# Patient Record
Sex: Male | Born: 1950 | Race: Black or African American | Hispanic: No | Marital: Married | Smoking: Former smoker
Health system: Southern US, Community
[De-identification: ages and names within clinical notes are randomized; demographics above are authoritative.]

## PROBLEM LIST (undated history)

## (undated) DIAGNOSIS — R011 Cardiac murmur, unspecified: Secondary | ICD-10-CM

## (undated) DIAGNOSIS — I519 Heart disease, unspecified: Secondary | ICD-10-CM

## (undated) DIAGNOSIS — N183 Chronic kidney disease, stage 3 unspecified: Secondary | ICD-10-CM

## (undated) DIAGNOSIS — I480 Paroxysmal atrial fibrillation: Secondary | ICD-10-CM

## (undated) DIAGNOSIS — M1990 Unspecified osteoarthritis, unspecified site: Secondary | ICD-10-CM

## (undated) DIAGNOSIS — I2510 Atherosclerotic heart disease of native coronary artery without angina pectoris: Secondary | ICD-10-CM

## (undated) DIAGNOSIS — E669 Obesity, unspecified: Secondary | ICD-10-CM

## (undated) DIAGNOSIS — I219 Acute myocardial infarction, unspecified: Secondary | ICD-10-CM

## (undated) DIAGNOSIS — I499 Cardiac arrhythmia, unspecified: Secondary | ICD-10-CM

## (undated) DIAGNOSIS — I10 Essential (primary) hypertension: Secondary | ICD-10-CM

## (undated) DIAGNOSIS — G709 Myoneural disorder, unspecified: Secondary | ICD-10-CM

## (undated) DIAGNOSIS — E1169 Type 2 diabetes mellitus with other specified complication: Secondary | ICD-10-CM

## (undated) DIAGNOSIS — Z9289 Personal history of other medical treatment: Secondary | ICD-10-CM

## (undated) HISTORY — DX: Obesity, unspecified: E66.9

## (undated) HISTORY — DX: Heart disease, unspecified: I51.9

## (undated) HISTORY — DX: Chronic kidney disease, stage 3 (moderate): N18.3

## (undated) HISTORY — DX: Essential (primary) hypertension: I10

## (undated) HISTORY — PX: RIB FRACTURE SURGERY: SHX2358

## (undated) HISTORY — PX: OTHER SURGICAL HISTORY: SHX169

## (undated) HISTORY — DX: Paroxysmal atrial fibrillation: I48.0

## (undated) HISTORY — DX: Type 2 diabetes mellitus with other specified complication: E66.9

## (undated) HISTORY — DX: Type 2 diabetes mellitus with other specified complication: E11.69

## (undated) HISTORY — DX: Personal history of other medical treatment: Z92.89

## (undated) HISTORY — DX: Chronic kidney disease, stage 3 unspecified: N18.30

---

## 2002-12-17 ENCOUNTER — Emergency Department (HOSPITAL_COMMUNITY): Admission: EM | Admit: 2002-12-17 | Discharge: 2002-12-17 | Payer: Self-pay | Admitting: Emergency Medicine

## 2003-05-03 ENCOUNTER — Encounter: Admission: RE | Admit: 2003-05-03 | Discharge: 2003-05-03 | Payer: Self-pay | Admitting: Family Medicine

## 2003-08-05 ENCOUNTER — Emergency Department (HOSPITAL_COMMUNITY): Admission: EM | Admit: 2003-08-05 | Discharge: 2003-08-05 | Payer: Self-pay | Admitting: Emergency Medicine

## 2007-10-15 ENCOUNTER — Inpatient Hospital Stay (HOSPITAL_COMMUNITY): Admission: EM | Admit: 2007-10-15 | Discharge: 2007-10-25 | Payer: Self-pay | Admitting: Emergency Medicine

## 2010-03-23 ENCOUNTER — Encounter: Payer: Self-pay | Admitting: Emergency Medicine
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
## 2011-03-08 ENCOUNTER — Encounter: Payer: BC Managed Care – PPO | Attending: Endocrinology | Admitting: *Deleted

## 2011-03-08 DIAGNOSIS — Z713 Dietary counseling and surveillance: Secondary | ICD-10-CM | POA: Insufficient documentation

## 2011-03-08 DIAGNOSIS — E119 Type 2 diabetes mellitus without complications: Secondary | ICD-10-CM | POA: Insufficient documentation

## 2011-03-09 ENCOUNTER — Encounter: Payer: Self-pay | Admitting: *Deleted

## 2011-03-09 NOTE — Progress Notes (Signed)
  Patient was seen on 03/08/11 for the first of a series of three diabetes self-management courses at the [facility]. The following learning objectives were met by the patient during this course:   Defines the role of glucose and insulin  Identifies type of diabetes and pathophysiology  Defines the diagnostic criteria for diabetes and prediabetes  States the risk factors for Type 2 Diabetes  States the symptoms of Type 2 Diabetes  Defines Type 2 Diabetes treatment goals  Defines Type 2 Diabetes treatment options  States the rationale for glucose monitoring  Identifies A1C, glucose targets, and testing times  Identifies proper sharps disposal  Defines the purpose of a diabetes food plan  Identifies carbohydrate food groups  Defines effects of carbohydrate foods on glucose levels  Identifies carbohydrate choices/grams/food labels  States benefits of physical activity and effect on glucose  Review of suggested activity guidelines  Handouts given during class include:  Type 2 Diabetes: Basics Book  My Food Plan Book  Food and Activity Log  No results found for this basename: HGBA1C   Most recent A1C per referring MD = 11.4%  Patient has established the following initial goals:  Lose weight  Follow DM Meal Plan  Increase exercise  Follow-Up Plan: Pt to call for follow-up or additional information PRN

## 2011-03-09 NOTE — Patient Instructions (Signed)
Patient will attend Core Diabetes Courses II and III as scheduled or follow up prn.  

## 2012-03-01 DIAGNOSIS — I499 Cardiac arrhythmia, unspecified: Secondary | ICD-10-CM

## 2012-03-01 DIAGNOSIS — I519 Heart disease, unspecified: Secondary | ICD-10-CM

## 2012-03-01 DIAGNOSIS — I219 Acute myocardial infarction, unspecified: Secondary | ICD-10-CM

## 2012-03-01 DIAGNOSIS — I480 Paroxysmal atrial fibrillation: Secondary | ICD-10-CM

## 2012-03-01 HISTORY — DX: Paroxysmal atrial fibrillation: I48.0

## 2012-03-01 HISTORY — DX: Acute myocardial infarction, unspecified: I21.9

## 2012-03-01 HISTORY — DX: Heart disease, unspecified: I51.9

## 2012-03-01 HISTORY — DX: Cardiac arrhythmia, unspecified: I49.9

## 2012-03-20 ENCOUNTER — Inpatient Hospital Stay (HOSPITAL_COMMUNITY)
Admission: AD | Admit: 2012-03-20 | Discharge: 2012-03-28 | DRG: 121 | Disposition: A | Discharge status: Home / Self Care | Payer: BC Managed Care – PPO | Source: Other Acute Inpatient Hospital | Attending: Cardiology | Admitting: Cardiology

## 2012-03-20 DIAGNOSIS — I200 Unstable angina: Secondary | ICD-10-CM

## 2012-03-20 DIAGNOSIS — I2589 Other forms of chronic ischemic heart disease: Secondary | ICD-10-CM | POA: Diagnosis present

## 2012-03-20 DIAGNOSIS — I441 Atrioventricular block, second degree: Secondary | ICD-10-CM | POA: Diagnosis present

## 2012-03-20 DIAGNOSIS — I255 Ischemic cardiomyopathy: Secondary | ICD-10-CM

## 2012-03-20 DIAGNOSIS — R55 Syncope and collapse: Secondary | ICD-10-CM

## 2012-03-20 DIAGNOSIS — I214 Non-ST elevation (NSTEMI) myocardial infarction: Secondary | ICD-10-CM | POA: Diagnosis present

## 2012-03-20 DIAGNOSIS — N189 Chronic kidney disease, unspecified: Secondary | ICD-10-CM

## 2012-03-20 DIAGNOSIS — N183 Chronic kidney disease, stage 3 unspecified: Secondary | ICD-10-CM | POA: Diagnosis present

## 2012-03-20 DIAGNOSIS — E669 Obesity, unspecified: Secondary | ICD-10-CM | POA: Diagnosis present

## 2012-03-20 DIAGNOSIS — Z6837 Body mass index (BMI) 37.0-37.9, adult: Secondary | ICD-10-CM

## 2012-03-20 DIAGNOSIS — E785 Hyperlipidemia, unspecified: Secondary | ICD-10-CM

## 2012-03-20 DIAGNOSIS — E119 Type 2 diabetes mellitus without complications: Secondary | ICD-10-CM | POA: Diagnosis present

## 2012-03-20 DIAGNOSIS — I4891 Unspecified atrial fibrillation: Secondary | ICD-10-CM | POA: Diagnosis present

## 2012-03-20 DIAGNOSIS — I129 Hypertensive chronic kidney disease with stage 1 through stage 4 chronic kidney disease, or unspecified chronic kidney disease: Secondary | ICD-10-CM | POA: Diagnosis present

## 2012-03-20 DIAGNOSIS — I2510 Atherosclerotic heart disease of native coronary artery without angina pectoris: Secondary | ICD-10-CM

## 2012-03-20 DIAGNOSIS — I480 Paroxysmal atrial fibrillation: Secondary | ICD-10-CM

## 2012-03-20 DIAGNOSIS — I499 Cardiac arrhythmia, unspecified: Secondary | ICD-10-CM

## 2012-03-20 DIAGNOSIS — I498 Other specified cardiac arrhythmias: Secondary | ICD-10-CM | POA: Diagnosis present

## 2012-03-20 DIAGNOSIS — Z79899 Other long term (current) drug therapy: Secondary | ICD-10-CM

## 2012-03-20 DIAGNOSIS — D649 Anemia, unspecified: Secondary | ICD-10-CM | POA: Diagnosis present

## 2012-03-20 DIAGNOSIS — I10 Essential (primary) hypertension: Secondary | ICD-10-CM | POA: Diagnosis present

## 2012-03-20 DIAGNOSIS — R001 Bradycardia, unspecified: Secondary | ICD-10-CM

## 2012-03-20 HISTORY — DX: Unspecified osteoarthritis, unspecified site: M19.90

## 2012-03-20 HISTORY — DX: Acute myocardial infarction, unspecified: I21.9

## 2012-03-20 HISTORY — DX: Atherosclerotic heart disease of native coronary artery without angina pectoris: I25.10

## 2012-03-20 HISTORY — DX: Myoneural disorder, unspecified: G70.9

## 2012-03-20 HISTORY — DX: Cardiac murmur, unspecified: R01.1

## 2012-03-20 HISTORY — DX: Cardiac arrhythmia, unspecified: I49.9

## 2012-03-20 LAB — CBC
HCT: 31.6 % — ABNORMAL LOW (ref 39.0–52.0)
Hemoglobin: 10.8 g/dL — ABNORMAL LOW (ref 13.0–17.0)
RBC: 3.58 MIL/uL — ABNORMAL LOW (ref 4.22–5.81)
WBC: 6.5 10*3/uL (ref 4.0–10.5)

## 2012-03-20 MED ORDER — ACETAMINOPHEN 325 MG PO TABS
650.0000 mg | ORAL_TABLET | ORAL | Status: DC | PRN
  Filled 2012-03-20: qty 2

## 2012-03-20 MED ORDER — INSULIN ASPART 100 UNIT/ML ~~LOC~~ SOLN
0.0000 [IU] | Freq: Every day | SUBCUTANEOUS | Status: DC
  Administered 2012-03-23 – 2012-03-24 (×2): 2 [IU] via SUBCUTANEOUS

## 2012-03-20 MED ORDER — INSULIN ASPART 100 UNIT/ML ~~LOC~~ SOLN: 0.0000 [IU] | Freq: Three times a day (TID) | SUBCUTANEOUS | Status: DC

## 2012-03-20 MED ORDER — ONDANSETRON HCL 4 MG/2ML IJ SOLN: 4.0000 mg | Freq: Four times a day (QID) | INTRAMUSCULAR | Status: DC | PRN

## 2012-03-20 MED ORDER — HEPARIN SODIUM (PORCINE) 5000 UNIT/ML IJ SOLN
5000.0000 [IU] | Freq: Three times a day (TID) | INTRAMUSCULAR | Status: DC
  Administered 2012-03-21: 5000 [IU] via SUBCUTANEOUS
  Filled 2012-03-20 (×2): qty 1

## 2012-03-20 NOTE — H&P (Signed)
Cardiology History and Physical  Default, Provider, MD  History of Present Illness (and review of medical records): Obed Samek is a 62 y.o. male who was transferred from [facility] for further evaluation of possible syncopal episode.  He has hx of HTN, HLD, DM, with prior hx of MI or known CAD.  He had prior stress testing 10-15 yrs ago but no catheterizations.  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  Of note patient does not remember syncope, but this is stated in ED reports from coworkers.  He reports episode where he felt disoriented, nauseous, with mild chest discomfort 1/10.  This chest discomfort felt like gas pain and is still present now.  His first troponin at ED was 0.068.  He did have eleated SBP in 200s and was given clonidine.  Blood sugar was in 200s.  He was transferred for further evaluation.  Review of Systems Further review of systems was otherwise negative other than stated in HPI.  There are no active problems to display for this patient.  Past Medical History  Diagnosis Date  . Diabetes mellitus   . Obese   . Hypertension    Medications Prior to Admission  Medication Sig Dispense Refill  . glipiZIDE (GLUCOTROL) 10 MG tablet Take 10 mg by mouth 2 (two) times daily before a meal.      . hydrochlorothiazide (HYDRODIURIL) 25 MG tablet Take 25 mg by mouth daily.      Marland Kitchen lisinopril (PRINIVIL,ZESTRIL) 40 MG tablet Take 40 mg by mouth daily.      . pioglitazone (ACTOS) 30 MG tablet Take 30 mg by mouth daily.      . pravastatin (PRAVACHOL) 40 MG tablet Take 40 mg by mouth daily.      . sitaGLIPtin (JANUVIA) 50 MG tablet Take 50 mg by mouth daily.        No past surgical history on file.  No Known Allergies  History  Substance Use Topics  . Smoking status: Never Smoker   . Smokeless tobacco: Not on file  . Alcohol Use: No    No family history on file.   Objective: Patient Vitals for the past 8  hrs:  BP Temp Pulse Resp SpO2 Height Weight  03/20/12 2317 - - - - 100 % - -  03/20/12 2045 126/77 mmHg 97.8 F (36.6 C) 56  18  100 % 6\' 2"  (1.88 m) 133.63 kg (294 lb 9.6 oz)   General Appearance:    Alert, cooperative, no distress, appears stated age, obese male  Head:    Normocephalic, without obvious abnormality, atraumatic  Eyes:     PERRL, EOMI, anicteric sclerae  Neck:   Supple, no carotid bruit or JVD  Lungs:     Clear to auscultation bilaterally, respirations unlabored  Heart:    bradycardic, S1 and S2 normal, no murmur  Abdomen:     Soft, non-tender, normoactive bowel sounds  Extremities:   Extremities normal, atraumatic, no cyanosis or edema  Pulses:   2+ and symmetric all extremities  Skin:   no rashes or lesions  Neurologic:   No focal deficits. AAO x3   Results for orders placed during the hospital encounter of 03/20/12 (from the past 48 hour(s))  GLUCOSE, CAPILLARY     Status: Abnormal   Collection Time   03/20/12  8:53 PM      Component Value Range Comment   Glucose-Capillary 171 (*) 70 - 99  mg/dL   CBC     Status: Abnormal   Collection Time   03/20/12 11:34 PM      Component Value Range Comment   WBC 6.5  4.0 - 10.5 K/uL    RBC 3.58 (*) 4.22 - 5.81 MIL/uL    Hemoglobin 10.8 (*) 13.0 - 17.0 g/dL    HCT 16.1 (*) 09.6 - 52.0 %    MCV 88.3  78.0 - 100.0 fL    MCH 30.2  26.0 - 34.0 pg    MCHC 34.2  30.0 - 36.0 g/dL    RDW 04.5  40.9 - 81.1 %    Platelets 193  150 - 400 K/uL   CREATININE, SERUM     Status: Abnormal   Collection Time   03/20/12 11:34 PM      Component Value Range Comment   Creatinine, Ser 2.03 (*) 0.50 - 1.35 mg/dL    GFR calc non Af Amer 33 (*) >90 mL/min    GFR calc Af Amer 39 (*) >90 mL/min   TROPONIN I     Status: Abnormal   Collection Time   03/20/12 11:34 PM      Component Value Range Comment   Troponin I 4.04 (*) <0.30 ng/mL    No results found.  ECG:  Sinus bradycardia HR45, FAVB, possible LVH, cannot rule out inferior ischemia, none  prior to compare  Assessment: 62M hx of DM, HTN, HLD p/w chest discomfort, nausea, and possible presyncope/syncope with positive troponin concerning for ACS/NSTEMI.  Plan:  1. Admit to Cardiology. 2. Continuous monitoring on Telemetry. 3. Repeat ekg on admit, prn chest pain or arrythmia 4. Trend cardiac biomarkers, check lipids, hgba1c, tsh 5. Medical management to include ASA, Heparin gtt, Statin, NTG prn 6. Hold BB given bradycardia. 7. Gentle IVFs with likely Acute on CRI 8. Will keep NPO for ischemic evaluation.

## 2012-03-21 ENCOUNTER — Encounter (HOSPITAL_COMMUNITY)
Admission: AD | Disposition: A | Discharge status: Home / Self Care | Payer: Self-pay | Source: Other Acute Inpatient Hospital | Attending: Cardiology

## 2012-03-21 DIAGNOSIS — I059 Rheumatic mitral valve disease, unspecified: Secondary | ICD-10-CM

## 2012-03-21 DIAGNOSIS — E119 Type 2 diabetes mellitus without complications: Secondary | ICD-10-CM

## 2012-03-21 DIAGNOSIS — I2510 Atherosclerotic heart disease of native coronary artery without angina pectoris: Secondary | ICD-10-CM

## 2012-03-21 DIAGNOSIS — N189 Chronic kidney disease, unspecified: Secondary | ICD-10-CM

## 2012-03-21 HISTORY — PX: LEFT HEART CATHETERIZATION WITH CORONARY ANGIOGRAM: SHX5451

## 2012-03-21 HISTORY — PX: CARDIAC CATHETERIZATION: SHX172

## 2012-03-21 LAB — CBC
Hemoglobin: 10.6 g/dL — ABNORMAL LOW (ref 13.0–17.0)
MCH: 29.9 pg (ref 26.0–34.0)
RBC: 3.54 MIL/uL — ABNORMAL LOW (ref 4.22–5.81)
WBC: 5.3 10*3/uL (ref 4.0–10.5)

## 2012-03-21 LAB — BASIC METABOLIC PANEL
CO2: 24 mEq/L (ref 19–32)
Calcium: 8.9 mg/dL (ref 8.4–10.5)
Chloride: 98 mEq/L (ref 96–112)
Glucose, Bld: 200 mg/dL — ABNORMAL HIGH (ref 70–99)
Potassium: 4.1 mEq/L (ref 3.5–5.1)
Sodium: 132 mEq/L — ABNORMAL LOW (ref 135–145)

## 2012-03-21 LAB — GLUCOSE, CAPILLARY: Glucose-Capillary: 217 mg/dL — ABNORMAL HIGH (ref 70–99)

## 2012-03-21 LAB — CREATININE, SERUM
Creatinine, Ser: 2.03 mg/dL — ABNORMAL HIGH (ref 0.50–1.35)
GFR calc Af Amer: 39 mL/min — ABNORMAL LOW (ref >90)
GFR calc non Af Amer: 33 mL/min — ABNORMAL LOW (ref >90)

## 2012-03-21 LAB — TROPONIN I: Troponin I: 5.79 ng/mL (ref <0.30)

## 2012-03-21 LAB — PROTIME-INR: INR: 1.13 (ref 0.00–1.49)

## 2012-03-21 LAB — LIPID PANEL: Cholesterol: 151 mg/dL (ref 0–200)

## 2012-03-21 SURGERY — LEFT HEART CATHETERIZATION WITH CORONARY ANGIOGRAM
Anesthesia: LOCAL

## 2012-03-21 MED ORDER — SODIUM CHLORIDE 0.9 % IV SOLN: 250.0000 mL | INTRAVENOUS | Status: DC | PRN

## 2012-03-21 MED ORDER — MIDAZOLAM HCL 2 MG/2ML IJ SOLN
INTRAMUSCULAR | Status: AC
  Filled 2012-03-21: qty 2

## 2012-03-21 MED ORDER — ASPIRIN 81 MG PO CHEW: 324.0000 mg | CHEWABLE_TABLET | ORAL | Status: DC

## 2012-03-21 MED ORDER — SODIUM CHLORIDE 0.9 % IV SOLN
1.0000 mL/kg/h | INTRAVENOUS | Status: AC
  Administered 2012-03-21: 18:00:00 1 mL/kg/h via INTRAVENOUS

## 2012-03-21 MED ORDER — SODIUM CHLORIDE 0.9 % IV SOLN: INTRAVENOUS | Status: AC

## 2012-03-21 MED ORDER — ISOSORBIDE MONONITRATE ER 30 MG PO TB24
30.0000 mg | ORAL_TABLET | Freq: Every day | ORAL | Status: DC
  Administered 2012-03-21 – 2012-03-22 (×2): 30 mg via ORAL
  Filled 2012-03-21 (×3): qty 1

## 2012-03-21 MED ORDER — LIDOCAINE HCL (PF) 1 % IJ SOLN
INTRAMUSCULAR | Status: AC
  Filled 2012-03-21: qty 30

## 2012-03-21 MED ORDER — FENTANYL CITRATE 0.05 MG/ML IJ SOLN
INTRAMUSCULAR | Status: AC
  Filled 2012-03-21: qty 2

## 2012-03-21 MED ORDER — INSULIN ASPART 100 UNIT/ML ~~LOC~~ SOLN: 0.0000 [IU] | Freq: Every day | SUBCUTANEOUS | Status: DC

## 2012-03-21 MED ORDER — HEPARIN BOLUS VIA INFUSION
4000.0000 [IU] | Freq: Once | INTRAVENOUS | Status: AC
  Administered 2012-03-21: 4000 [IU] via INTRAVENOUS
  Filled 2012-03-21: qty 4000

## 2012-03-21 MED ORDER — SODIUM CHLORIDE 0.9 % IJ SOLN: 3.0000 mL | Freq: Two times a day (BID) | INTRAMUSCULAR | Status: DC

## 2012-03-21 MED ORDER — HEPARIN SODIUM (PORCINE) 1000 UNIT/ML IJ SOLN
INTRAMUSCULAR | Status: AC
  Filled 2012-03-21: qty 1

## 2012-03-21 MED ORDER — VERAPAMIL HCL 2.5 MG/ML IV SOLN
INTRAVENOUS | Status: AC
  Filled 2012-03-21: qty 2

## 2012-03-21 MED ORDER — ATORVASTATIN CALCIUM 80 MG PO TABS
80.0000 mg | ORAL_TABLET | Freq: Every day | ORAL | Status: DC
  Administered 2012-03-22 – 2012-03-28 (×7): 80 mg via ORAL
  Filled 2012-03-21 (×9): qty 1

## 2012-03-21 MED ORDER — SODIUM CHLORIDE 0.9 % IV SOLN
INTRAVENOUS | Status: DC
  Administered 2012-03-21: 12:00:00 via INTRAVENOUS

## 2012-03-21 MED ORDER — INSULIN ASPART 100 UNIT/ML ~~LOC~~ SOLN: 0.0000 [IU] | SUBCUTANEOUS | Status: DC

## 2012-03-21 MED ORDER — INSULIN ASPART 100 UNIT/ML ~~LOC~~ SOLN
0.0000 [IU] | Freq: Three times a day (TID) | SUBCUTANEOUS | Status: DC
  Administered 2012-03-21: 3 [IU] via SUBCUTANEOUS
  Administered 2012-03-21: 5 [IU] via SUBCUTANEOUS
  Administered 2012-03-22 (×2): 3 [IU] via SUBCUTANEOUS
  Administered 2012-03-22: 13:00:00 5 [IU] via SUBCUTANEOUS
  Administered 2012-03-23: 2 [IU] via SUBCUTANEOUS
  Administered 2012-03-23: 3 [IU] via SUBCUTANEOUS
  Administered 2012-03-23: 5 [IU] via SUBCUTANEOUS
  Administered 2012-03-24 (×2): 3 [IU] via SUBCUTANEOUS
  Administered 2012-03-24 – 2012-03-25 (×2): 5 [IU] via SUBCUTANEOUS
  Administered 2012-03-25 – 2012-03-26 (×2): 3 [IU] via SUBCUTANEOUS
  Administered 2012-03-26: 5 [IU] via SUBCUTANEOUS
  Administered 2012-03-27 – 2012-03-28 (×4): 3 [IU] via SUBCUTANEOUS

## 2012-03-21 MED ORDER — NITROGLYCERIN 0.2 MG/ML ON CALL CATH LAB
INTRAVENOUS | Status: AC
  Filled 2012-03-21: qty 1

## 2012-03-21 MED ORDER — METOPROLOL TARTRATE 25 MG PO TABS
25.0000 mg | ORAL_TABLET | Freq: Two times a day (BID) | ORAL | Status: DC
  Administered 2012-03-21: 25 mg via ORAL
  Filled 2012-03-21 (×3): qty 1

## 2012-03-21 MED ORDER — SODIUM CHLORIDE 0.9 % IJ SOLN: 3.0000 mL | INTRAMUSCULAR | Status: DC | PRN

## 2012-03-21 MED ORDER — ASPIRIN 81 MG PO CHEW
324.0000 mg | CHEWABLE_TABLET | Freq: Every day | ORAL | Status: DC
  Administered 2012-03-21 – 2012-03-28 (×8): 324 mg via ORAL
  Filled 2012-03-21: qty 4
  Filled 2012-03-21: qty 1
  Filled 2012-03-21 (×5): qty 4
  Filled 2012-03-21: qty 1

## 2012-03-21 MED ORDER — HEPARIN (PORCINE) IN NACL 100-0.45 UNIT/ML-% IJ SOLN
1400.0000 [IU]/h | INTRAMUSCULAR | Status: DC
  Administered 2012-03-21: 1400 [IU]/h via INTRAVENOUS
  Filled 2012-03-21 (×2): qty 250

## 2012-03-21 MED ORDER — HEPARIN (PORCINE) IN NACL 2-0.9 UNIT/ML-% IJ SOLN
INTRAMUSCULAR | Status: AC
  Filled 2012-03-21: qty 2000

## 2012-03-21 MED ORDER — SODIUM CHLORIDE 0.9 % IV SOLN: INTRAVENOUS | Status: DC

## 2012-03-21 NOTE — Progress Notes (Signed)
Patient ID: [name] [name], male   DOB: 10/30/1950, 61 y.o.   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  No definite chest pain.  Nausea lasted at least an hour.  He did not actually pass out, just had to sit down while working because of the nausea. Today, he feels fine with no nausea or chest pain.   ECG this morning NSR with inferior T wave inversions suggestive of ischemia.    Filed Vitals:   03/20/12 2045 03/20/12 2317 03/21/12 0500  BP: 126/77  124/63  Pulse: 56  53  Temp: 97.8 F (36.6 C)  98.6 F (37 C)  Resp: 18  18  Height: 6' 2" (1.88 m)    Weight: 294 lb 9.6 oz (133.63 kg)    SpO2: 100% 100% 100%   No intake or output data in the 24 hours ending 03/21/12 0826  LABS: Basic Metabolic Panel:  Basename 03/21/12 0505 03/20/12 2334  NA 132* --  K 4.1 --  CL 98 --  CO2 24 --  GLUCOSE 200* --  BUN 33* --  CREATININE 2.11* 2.03*  CALCIUM 8.9 --  MG -- --  PHOS -- --   Liver Function Tests: No results found for this basename: AST:2,ALT:2,ALKPHOS:2,BILITOT:2,PROT:2,ALBUMIN:2 in the last 72 hours No results found for this basename: LIPASE:2,AMYLASE:2 in the last 72 hours CBC:  Basename 03/21/12 0505 03/20/12 2334  WBC 5.3 6.5  NEUTROABS -- --  HGB 10.6* 10.8*  HCT 31.4* 31.6*  MCV 88.7 88.3  PLT 193 193   Cardiac Enzymes:  Basename 03/21/12 0505 03/20/12 2334  CKTOTAL -- --  CKMB -- --  CKMBINDEX -- --  TROPONINI 5.79* 4.04*   BNP: No components found with this basename: POCBNP:3 D-Dimer: No results found for this basename: DDIMER:2 in the last 72 hours Hemoglobin A1C: No results found for this basename: HGBA1C in the last 72 hours Fasting Lipid Panel:  Basename 03/21/12 0505  CHOL 151  HDL 36*  LDLCALC 105*  TRIG 51  CHOLHDL 4.2  LDLDIRECT --   Thyroid Function Tests: No results found for this basename: TSH,T4TOTAL,FREET3,T3FREE,THYROIDAB in the last 72 hours Anemia Panel: No  results found for this basename: VITAMINB12,FOLATE,FERRITIN,TIBC,IRON,RETICCTPCT in the last 72 hours  RADIOLOGY: No results found.  PHYSICAL EXAM General: NAD, obese Neck: No JVD, no thyromegaly or thyroid nodule.  Lungs: Clear to auscultation bilaterally with normal respiratory effort. CV: Nondisplaced PMI.  Heart regular S1/S2, no S3/S4, no murmur.  No peripheral edema.  No carotid bruit.  Normal pedal pulses.  Abdomen: Soft, nontender, no hepatosplenomegaly, no distention.  Neurologic: Alert and oriented x 3.  Psych: Normal affect. Extremities: No clubbing or cyanosis.   TELEMETRY: Reviewed telemetry pt in NSR  ASSESSMENT AND PLAN:  61 yo with history of CKD, DM, HTN presented with episode of nausea lasting around an hour.  Cardiac enzymes elevated suggestive of NSTEMI. 1. CAD: NSTEMI.  Nausea was likely his ischemic equivalent.  He was admitted with "syncope" but he says he never actually passed out, just had to sit down at work due to nausea.  He is asymptomatic this morning.  ECG with deep inferior T wave inversions.  Patient has CKD with creatinine 2.1 today.  - Hydrate aggressively this morning, catheterization this afternoon.  I had a long discuss with patient and wife about risks of cardiac cath, especially worsening renal function.  However, he has had substantial NSTEMI with TnI 5.79 this morning and ECG   changes. - Continue heparin gtt, ASA, statin.  Holding off on beta blocker for now with HR in the 50s and ACEI with CKD.  - No LV-gram, echo for LV fxn.  2. CKD: Creatinine was 1.9 in 2009, now 2.1.  Suspect this is chronic due to DM and HTN.  Will need to minimize contrast dye with cath and will hydrate aggessively with NS @ 125 cc/hr until this afternoon.  3. DM: Sliding scale insulin for now.  He is on Actos at home, would not use this agent going forward.   [name] [name] 03/21/2012 8:33 AM   

## 2012-03-21 NOTE — Progress Notes (Signed)
ANTICOAGULATION CONSULT NOTE - Initial Consult  Pharmacy Consult for heparin Indication: chest pain/ACS  No Known Allergies  Patient Measurements: Height: 6\' 2"  (188 cm) Weight: 294 lb 9.6 oz (133.63 kg) IBW/kg (Calculated) : 82.2  Heparin Dosing Weight: 100 kg  Vital Signs: Temp: 97.8 F (36.6 C) (01/20 2045) BP: 126/77 mmHg (01/20 2045) Pulse Rate: 56  (01/20 2045)  Labs:  Basename 03/20/12 2334  HGB 10.8*  HCT 31.6*  PLT 193  APTT --  LABPROT --  INR --  HEPARINUNFRC --  CREATININE 2.03*  CKTOTAL --  CKMB --  TROPONINI 4.04*    Estimated Creatinine Clearance: 54.9 ml/min (by C-G formula based on Cr of 2.03).   Medical History: Past Medical History  Diagnosis Date  . Diabetes mellitus   . Obese   . Hypertension     Medications:  No prescriptions prior to admission    Assessment: 62 yo with positive troponin to start IV heparin. Goal of Therapy:  Heparin level 0.3-0.7 units/ml Monitor platelets by anticoagulation protocol: Yes   Plan:  Heparin 4000 unit bolus and drip at 1400 units/hr. Check heparin level and CBC 6 hours after start and daily while on heparin.  [name] [name] 03/21/2012,1:10 AM

## 2012-03-21 NOTE — H&P (View-Only) (Signed)
Patient ID: [name] [name], male   DOB: 1950-12-17, 62 y.o.   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  No definite chest pain.  Nausea lasted at least an hour.  He did not actually pass out, just had to sit down while working because of the nausea. Today, he feels fine with no nausea or chest pain.   ECG this morning NSR with inferior T wave inversions suggestive of ischemia.    Filed Vitals:   03/20/12 2045 03/20/12 2317 03/21/12 0500  BP: 126/77  124/63  Pulse: 56  53  Temp: 97.8 F (36.6 C)  98.6 F (37 C)  Resp: 18  18  Height: 6\' 2"  (1.88 m)    Weight: 294 lb 9.6 oz (133.63 kg)    SpO2: 100% 100% 100%   No intake or output data in the 24 hours ending 03/21/12 0826  LABS: Basic Metabolic Panel:  Basename 03/21/12 0505 03/20/12 2334  NA 132* --  K 4.1 --  CL 98 --  CO2 24 --  GLUCOSE 200* --  BUN 33* --  CREATININE 2.11* 2.03*  CALCIUM 8.9 --  MG -- --  PHOS -- --   Liver Function Tests: No results found for this basename: AST:2,ALT:2,ALKPHOS:2,BILITOT:2,PROT:2,ALBUMIN:2 in the last 72 hours No results found for this basename: LIPASE:2,AMYLASE:2 in the last 72 hours CBC:  Basename 03/21/12 0505 03/20/12 2334  WBC 5.3 6.5  NEUTROABS -- --  HGB 10.6* 10.8*  HCT 31.4* 31.6*  MCV 88.7 88.3  PLT 193 193   Cardiac Enzymes:  Basename 03/21/12 0505 03/20/12 2334  CKTOTAL -- --  CKMB -- --  CKMBINDEX -- --  TROPONINI 5.79* 4.04*   BNP: No components found with this basename: POCBNP:3 D-Dimer: No results found for this basename: DDIMER:2 in the last 72 hours Hemoglobin A1C: No results found for this basename: HGBA1C in the last 72 hours Fasting Lipid Panel:  Basename 03/21/12 0505  CHOL 151  HDL 36*  LDLCALC 105*  TRIG 51  CHOLHDL 4.2  LDLDIRECT --   Thyroid Function Tests: No results found for this basename: TSH,T4TOTAL,FREET3,T3FREE,THYROIDAB in the last 72 hours Anemia Panel: No  results found for this basename: VITAMINB12,FOLATE,FERRITIN,TIBC,IRON,RETICCTPCT in the last 72 hours  RADIOLOGY: No results found.  PHYSICAL EXAM General: NAD, obese Neck: No JVD, no thyromegaly or thyroid nodule.  Lungs: Clear to auscultation bilaterally with normal respiratory effort. CV: Nondisplaced PMI.  Heart regular S1/S2, no S3/S4, no murmur.  No peripheral edema.  No carotid bruit.  Normal pedal pulses.  Abdomen: Soft, nontender, no hepatosplenomegaly, no distention.  Neurologic: Alert and oriented x 3.  Psych: Normal affect. Extremities: No clubbing or cyanosis.   TELEMETRY: Reviewed telemetry pt in NSR  ASSESSMENT AND PLAN:  62 yo with history of CKD, DM, HTN presented with episode of nausea lasting around an hour.  Cardiac enzymes elevated suggestive of NSTEMI. 1. CAD: NSTEMI.  Nausea was likely his ischemic equivalent.  He was admitted with "syncope" but he says he never actually passed out, just had to sit down at work due to nausea.  He is asymptomatic this morning.  ECG with deep inferior T wave inversions.  Patient has CKD with creatinine 2.1 today.  - Hydrate aggressively this morning, catheterization this afternoon.  I had a long discuss with patient and wife about risks of cardiac cath, especially worsening renal function.  However, he has had substantial NSTEMI with TnI 5.79 this morning and ECG  changes. - Continue heparin gtt, ASA, statin.  Holding off on beta blocker for now with HR in the 50s and ACEI with CKD.  - No LV-gram, echo for LV fxn.  2. CKD: Creatinine was 1.9 in 2009, now 2.1.  Suspect this is chronic due to DM and HTN.  Will need to minimize contrast dye with cath and will hydrate aggessively with NS @ 125 cc/hr until this afternoon.  3. DM: Sliding scale insulin for now.  He is on Actos at home, would not use this agent going forward.   Marca Ancona 03/21/2012 8:33 AM

## 2012-03-21 NOTE — Progress Notes (Signed)
CRITICAL VALUE ALERT  Critical value received:  Troponin 4.04   Date of notification:  03/21/2012  Time of notification:  0036  Critical value read back:yes  Nurse who received alert:  Jodene Nam RN  MD notified (1st page):  Dr. Terressa Koyanagi  Time of first page:  0037  Responding MD:  Dr. Terressa Koyanagi  Time MD responded:  [phone]

## 2012-03-21 NOTE — Progress Notes (Signed)
TR BAND REMOVAL  LOCATION:  right radial  DEFLATED PER PROTOCOL:  yes  TIME BAND OFF / DRESSING APPLIED:   1840   SITE UPON ARRIVAL:   Level 0  SITE AFTER BAND REMOVAL:  Level 0  REVERSE ALLEN'S TEST:    positive  CIRCULATION SENSATION AND MOVEMENT:  Within Normal Limits  yes  COMMENTS:

## 2012-03-21 NOTE — CV Procedure (Signed)
   Cardiac Catheterization Procedure Note  Name: [name] [name] MRN: [id number] DOB: Oct 12, 1950  Procedure: Left Heart Cath, Selective Coronary Angiography  Indication: 63 yo BM presents with syncope and a NSTEMI.   Procedural Details: The right wrist was prepped, draped, and anesthetized with 1% lidocaine. Using the modified Seldinger technique, a 5 French sheath was introduced into the right radial artery. 3 mg of verapamil was administered through the sheath, weight-based unfractionated heparin was administered intravenously. Standard Judkins catheters were used for selective coronary angiography.  The left coronary was difficult to engage and a ERADL catheter was used.Catheter exchanges were performed over an exchange length guidewire. There were no immediate procedural complications. A TR band was used for radial hemostasis at the completion of the procedure.  The patient was transferred to the post catheterization recovery area for further monitoring.  Procedural Findings: Hemodynamics: AO 150/68 with a mean of 96 mm Hg LV 151/21 mm Hg  Coronary angiography: Coronary dominance: right  Left mainstem: 30% distal left main.  Left anterior descending (LAD): Mild wall irregularities. There is a large diagonal with a 90-95% ostial lesion.  Left circumflex (LCx): The left circumflex gives rise to a single large marginal branch. There is a 70-80% ostial LCX lesion.  Right coronary artery (RCA): The RCA is occluded proximally with evidence of recent thrombus. There are left to right collaterals to the distal RCA.  Left ventriculography: Not performed.  Final Conclusions:   1. 3 vessel obstructive CAD. Recent RCA occlusion.   Recommendations: Would maximize medical therapy. Consider stress myoview on medical therapy to assess ischemic burden and symptoms. Ostial location of diagonal and LCX disease is not optimal for PCI. If significant symptoms or ischemia on medical therapy may need to  consider CABG.  [name] [facility] 03/21/2012, 4:49 PM

## 2012-03-21 NOTE — Progress Notes (Signed)
1932 Patient heart rate dropped 43 and  rhythm changed to second degree heart block type 2 returning to sinus rhythm.Asymptomatic denies chest pain and shortness of breath blood pressure 146/64.Call placed to Ward Givens NP.No new orders received will continue to monitor patient and strip posted on chart.

## 2012-03-21 NOTE — Progress Notes (Signed)
ANTICOAGULATION CONSULT NOTE - Follw-up  Pharmacy Consult for heparin Indication: chest pain/ACS  No Known Allergies  Patient Measurements: Height: 6\' 2"  (188 cm) Weight: 294 lb 9.6 oz (133.63 kg) IBW/kg (Calculated) : 82.2  Heparin Dosing Weight: 100 kg  Vital Signs: Temp: 98.6 F (37 C) (01/21 0500) BP: 124/63 mmHg (01/21 0500) Pulse Rate: 53  (01/21 0500)  Labs:  Basename 03/21/12 0925 03/21/12 0505 03/20/12 2334  HGB -- 10.6* 10.8*  HCT -- 31.4* 31.6*  PLT -- 193 193  APTT -- -- --  LABPROT -- 14.3 --  INR -- 1.13 --  HEPARINUNFRC 0.59 -- --  CREATININE -- 2.11* 2.03*  CKTOTAL -- -- --  CKMB -- -- --  TROPONINI -- 5.79* 4.04*    Estimated Creatinine Clearance: 52.8 ml/min (by C-G formula based on Cr of 2.11).  Assessment: 45 yom presented with syncope and elevated troponins. First heparin level is therapeutic at 0.59. No bleeding noted. CBC is low but stable. Plan for cath this afternoon after hydration.   Goal of Therapy:  Heparin level 0.3-0.7 units/ml Monitor platelets by anticoagulation protocol: Yes   Plan:  1. Continue heparin gtt at 1400 units/hr 2. Check a 6 hour heparin level or f/u post-cath  Javanni Maring, Drake Leach 03/21/2012,10:53 AM

## 2012-03-21 NOTE — Progress Notes (Signed)
  Echocardiogram 2D Echocardiogram has been performed.  Tobby Fawcett 03/21/2012, 10:30 AM

## 2012-03-21 NOTE — Interval H&P Note (Signed)
History and Physical Interval Note:  03/21/2012 4:08 PM  [name] [name]  has presented today for surgery, with the diagnosis of cp  The various methods of treatment have been discussed with the patient and family. After consideration of risks, benefits and other options for treatment, the patient has consented to  Procedure(s) (LRB) with comments: LEFT HEART CATHETERIZATION WITH CORONARY ANGIOGRAM (N/A) as a surgical intervention .  The patient's history has been reviewed, patient examined, no change in status, stable for surgery.  I have reviewed the patient's chart and labs.  Questions were answered to the patient's satisfaction.     [name] [facility] 03/21/2012 4:09 PM

## 2012-03-22 ENCOUNTER — Encounter (HOSPITAL_COMMUNITY): Payer: Self-pay | Admitting: Cardiology

## 2012-03-22 DIAGNOSIS — I2119 ST elevation (STEMI) myocardial infarction involving other coronary artery of inferior wall: Secondary | ICD-10-CM

## 2012-03-22 DIAGNOSIS — I498 Other specified cardiac arrhythmias: Secondary | ICD-10-CM

## 2012-03-22 LAB — BASIC METABOLIC PANEL
Calcium: 8.9 mg/dL (ref 8.4–10.5)
GFR calc Af Amer: 42 mL/min — ABNORMAL LOW (ref >90)
GFR calc non Af Amer: 36 mL/min — ABNORMAL LOW (ref >90)
Sodium: 135 mEq/L (ref 135–145)

## 2012-03-22 LAB — CBC
MCH: 30.2 pg (ref 26.0–34.0)
MCHC: 33.8 g/dL (ref 30.0–36.0)
Platelets: 186 10*3/uL (ref 150–400)
RBC: 3.38 MIL/uL — ABNORMAL LOW (ref 4.22–5.81)

## 2012-03-22 LAB — GLUCOSE, CAPILLARY

## 2012-03-22 MED ORDER — YOU HAVE A PACEMAKER BOOK
Freq: Once | Status: DC
  Filled 2012-03-22: qty 1

## 2012-03-22 MED ORDER — AMLODIPINE BESYLATE 5 MG PO TABS
5.0000 mg | ORAL_TABLET | Freq: Every day | ORAL | Status: DC
  Administered 2012-03-22 – 2012-03-28 (×7): 5 mg via ORAL
  Filled 2012-03-22 (×7): qty 1

## 2012-03-22 MED ORDER — ISOSORBIDE MONONITRATE ER 30 MG PO TB24
30.0000 mg | ORAL_TABLET | Freq: Once | ORAL | Status: AC
  Administered 2012-03-22: 19:00:00 30 mg via ORAL
  Filled 2012-03-22: qty 1

## 2012-03-22 MED ORDER — ISOSORBIDE MONONITRATE ER 60 MG PO TB24
60.0000 mg | ORAL_TABLET | Freq: Every day | ORAL | Status: DC
  Administered 2012-03-23 – 2012-03-28 (×6): 60 mg via ORAL
  Filled 2012-03-22 (×7): qty 1

## 2012-03-22 NOTE — Consult Note (Signed)
ELECTROPHYSIOLOGY CONSULT NOTE  Patient ID: [name] [name] MRN: [id number], DOB/AGE: 1950/10/09   Admit date: 03/20/2012 Date of Consult: 03/22/2012  Primary Physician:  Primary Cardiologist: New to  Reason for Consultation: Bradycardia, syncope  History of Present Illness [name] [name] is a 62 year old gentleman with HTN, dyslipidemia, DM and CKD who was admitted with chest pain and syncope. [name] describes the sudden onset of chest heaviness while moving boxes at work. His chest pain was accompanied by nausa, vomiting and diaphoresis. After a couple of minutes, he became dizzy and decided to sit down to "stretch out" his chest and while seated he lost consciousness. This was witnessed by his co-workers. He tells me he was told he was unresponsive for 5 minutes. EMS was activated. [name] states he regained consciousness and walked to the loading dock where he waited for the ambulance. His dizziness was improved at that point; however, his chest pain and diaphoresis continued. On arrival here, his 12-lead ECG revealed sinus rhythm with first degree AV block, inferior T wave inversions and incomplete LBBB. He was admitted and started on medical therapy with ASA, IV heparin, NTG and statin therapy. He was not given beta blocker due to bradycardia. Peak troponin 5.79. On 03/21/2012 he was taken to the cardiac cath lab which revealed 3-vessel CAD including proximal RCA occlusion with evidence of recent thrombus. There are left to right collaterals to the distal RCA. It was recommended he be optimized on medical therapy then scheduled for outpatient Myoview to assess ischemic burden and symptoms. While here on telemetry he has had intermittent sinus bradycardia along with second degree AV block, Wenckebach/Mobitz I and 2:1 AV block; therefore, Dr. Graciela Husbands has been asked to provide EP recommendations.     Past Medical History Past Medical History  Diagnosis Date  . Diabetes mellitus   . Obese   .  Hypertension     Past Surgical History Past Surgical History  Procedure Date  . S/p knee surgery for torn ligament      Allergies/Intolerances No Known Allergies  Inpatient Medications    . aspirin  324 mg Oral Daily  . atorvastatin  80 mg Oral q1800  . insulin aspart  0-15 Units Subcutaneous TID WC  . insulin aspart  0-5 Units Subcutaneous QHS  . isosorbide mononitrate  30 mg Oral Daily  . metoprolol tartrate  25 mg Oral BID    Family History Positive for CAD/MI in paternal aunt at age 58 years; none prematurely   Social History Social History  . Marital Status: Married   Social History Main Topics  . Smoking status: Never Smoker   . Smokeless tobacco: Not on file  . Alcohol Use: No  . Drug Use:   . Sexually Active:    Review of Systems General: No chills, fever, night sweats or weight changes  Cardiovascular: No chest pain, dyspnea on exertion, edema, orthopnea, palpitations, paroxysmal nocturnal dyspnea Dermatological: No rash, lesions or masses Respiratory: No cough, dyspnea Urologic: No hematuria, dysuria Abdominal: No nausea, vomiting, diarrhea, bright red blood per rectum, melena, or hematemesis Neurologic: No visual changes, weakness, changes in mental status All other systems reviewed and are otherwise negative except as noted above.  Physical Exam Blood pressure 146/72, pulse 66, temperature 98.8 F (37.1 C), temperature source Oral, resp. rate 23, height 6\' 2"  (1.88 m), weight 297 lb 6.4 oz (134.9 kg), SpO2 97.00%.  General: Well developed, well appearing 62 year old male in no acute distress. HEENT: Normocephalic,  atraumatic. EOMs intact. Sclera nonicteric. Oropharynx clear.  LN--neg Neck: Supple without bruits. No JVD. Lungs: Respirations regular and unlabored, CTA bilaterally. No wheezes, rales or rhonchi. Back  Without CVAT or kyphosis Heart: RRR. S1, S2 present. No murmurs, rub, S3 or S4. Abdomen: Soft, non-tender, non-distended. BS present x 4  quadrants. No hepatosplenomegaly.  Extremities: No clubbing, cyanosis or edema. DP/PT/Radials 2+ and equal bilaterally. Psych: Normal affect. Neuro: Alert and oriented X 3. Moves all extremities spontaneously. Musculoskeletal: No kyphosis. Skin: Intact. Warm and dry. No rashes or petechiae in exposed areas.   Labs  Basename 03/21/12 0505 03/20/12 2334  CKTOTAL -- --  CKMB -- --  TROPONINI 5.79* 4.04*   Lab Results  Component Value Date   WBC 6.2 03/22/2012   HGB 10.2* 03/22/2012   HCT 30.2* 03/22/2012   MCV 89.3 03/22/2012   PLT 186 03/22/2012     Lab 03/22/12 0700  NA 135  K 4.2  CL 101  CO2 22  BUN 25*  CREATININE 1.89*  CALCIUM 8.9  PROT --  BILITOT --  ALKPHOS --  ALT --  AST --  GLUCOSE 190*   Lab Results  Component Value Date   CHOL 151 03/21/2012   HDL 36* 03/21/2012   LDLCALC 105* 03/21/2012   TRIG 51 03/21/2012    Basename 03/21/12 0505  INR 1.13    Radiology/Studies No results found.  Cardiac catheterization 03/22/2012 Procedural Findings:  Hemodynamics:  AO 150/68 with a mean of 96 mm Hg  LV 151/21 mm Hg  Coronary angiography:  Coronary dominance: right  Left mainstem: 30% distal left main.  Left anterior descending (LAD): Mild wall irregularities. There is a large diagonal with a 90-95% ostial lesion.  Left circumflex (LCx): The left circumflex gives rise to a single large marginal branch. There is a 70-80% ostial LCX lesion.  Right coronary artery (RCA): The RCA is occluded proximally with evidence of recent thrombus. There are left to right collaterals to the distal RCA.  Left ventriculography: Not performed.  Final Conclusions:  - 3 vessel obstructive CAD. Recent RCA occlusion.  Recommendations: Would maximize medical therapy. Consider stress myoview on medical therapy to assess ischemic burden and symptoms. Ostial location of diagonal and LCX disease is not optimal for PCI. If significant symptoms or ischemia on medical therapy may need to  consider CABG.   Echocardiogram  Study Conclusions - Left ventricle: The cavity size was normal. Wall thickness was increased in a pattern of mild LVH. Systolic function was mildly reduced. The estimated ejection fraction was in the range of 45% to 50%. There is akinesis of the inferior myocardium. Doppler parameters are consistent with abnormal left ventricular relaxation (grade 1 diastolic dysfunction). - Mitral valve: Mild regurgitation.   12-lead ECG on admission revealed sinus rhythm with first degree AV block, inferior T wave inversions and incomplete LBBB Telemetry shows sinus rhythm currently at 63 bpm; there is intermittent sinus bradycardia as well as second degree AV block, Mobitz I/Wenckebach and 2:1 AV block   Assessment and Plan 1. Aborted inferior MI 2. CAD 3. Mild LV dysfunction, EF 45-50% 4. Bradycardia with intermittent second degree AV block, Wenckebach, and 2:1 AV block 5. Renal insufficiency  Dr. Graciela Husbands to see and make further recommendations Signed, Rick Duff, PA-C 03/22/2012, 1:30 PM  History and physical reviewed as above.  Briefly, likely MI related to occlusion of his RCA accompanied by nausea diaphoresis and syncope. This could well have been a Bezold-Jarisch reflex. Alternatively with the heart  block noted subsequently included in transient heart block. The duration is more consistent with a vasomotor episode although I think it is likely that 5 minutes is an exaggeration.  Heart block occurring now in the context of his inferior wall MI should be self-limited and did not require pacing intervention. And as such I would observe him in hospital until he has heart block free for 48 hours waiting at least 7-10 days prior to making a decision for pacing.  Another potential mechanism of syncope is ventricular tachycardia in the context of an inferior wall motion abnormality and the previously occluded distal RCA as suggested by collateralization. However, the  temporal sequence  certainly speak of acute ischemic event.  I reviewed the above with Dr. [place]. We will anticipate aggressive medical therapy with reevaluation for ischemia perhaps with stress testing on Monday. At that juncture decision will likely be made as to whether revascularization, presumably with surgery would be recommended. To that end, I will increase his Imdur and add amlodipine  I also encouraged him to continue on his weight loss. He is apparently lost about 100 pounds already. I should note that he had a negative sleep study last year.

## 2012-03-22 NOTE — Progress Notes (Signed)
Utilization Review Completed Ethaniel Garfield J. Claudis Giovanelli, RN, BSN, NCM [phone]  

## 2012-03-23 DIAGNOSIS — I214 Non-ST elevation (NSTEMI) myocardial infarction: Secondary | ICD-10-CM | POA: Diagnosis present

## 2012-03-23 DIAGNOSIS — E669 Obesity, unspecified: Secondary | ICD-10-CM | POA: Diagnosis present

## 2012-03-23 DIAGNOSIS — I10 Essential (primary) hypertension: Secondary | ICD-10-CM | POA: Diagnosis present

## 2012-03-23 DIAGNOSIS — N183 Chronic kidney disease, stage 3 unspecified: Secondary | ICD-10-CM | POA: Diagnosis present

## 2012-03-23 DIAGNOSIS — I441 Atrioventricular block, second degree: Secondary | ICD-10-CM | POA: Diagnosis present

## 2012-03-23 LAB — GLUCOSE, CAPILLARY
Glucose-Capillary: 198 mg/dL — ABNORMAL HIGH (ref 70–99)
Glucose-Capillary: 213 mg/dL — ABNORMAL HIGH (ref 70–99)
Glucose-Capillary: 148 mg/dL — ABNORMAL HIGH (ref 70–99)
Glucose-Capillary: 234 mg/dL — ABNORMAL HIGH (ref 70–99)

## 2012-03-23 LAB — CBC
HCT: 28.5 % — ABNORMAL LOW (ref 39.0–52.0)
MCV: 88.5 fL (ref 78.0–100.0)
RBC: 3.22 MIL/uL — ABNORMAL LOW (ref 4.22–5.81)
WBC: 5.9 10*3/uL (ref 4.0–10.5)

## 2012-03-23 MED ORDER — ASPIRIN 81 MG PO CHEW: 81.0000 mg | CHEWABLE_TABLET | Freq: Every day | ORAL | Status: DC

## 2012-03-23 MED ORDER — GLIPIZIDE ER 10 MG PO TB24
10.0000 mg | ORAL_TABLET | Freq: Every day | ORAL | Status: DC
  Administered 2012-03-24 – 2012-03-28 (×5): 10 mg via ORAL
  Filled 2012-03-23 (×8): qty 1

## 2012-03-23 MED ORDER — LINAGLIPTIN 5 MG PO TABS
5.0000 mg | ORAL_TABLET | Freq: Every day | ORAL | Status: DC
  Administered 2012-03-23 – 2012-03-28 (×6): 5 mg via ORAL
  Filled 2012-03-23 (×6): qty 1

## 2012-03-23 NOTE — Progress Notes (Addendum)
Patient Name: [name] [name] Date of Encounter: 03/23/2012  Principal Problem:  *Non-ST elevation myocardial infarction (NSTEMI), subendocardial infarction, initial episode of care Active Problems:  Hypertension  Dysrhythmia  Chronic kidney disease  Obese  Mobitz type 1 second degree atrioventricular block    SUBJECTIVE: [name] [name] is a 62 year old male with history of CAD, HTN, CKD, and DM who was admitted for chest tightness and syncope. He had associated nausea and diaphoresis. He became dizzy and had to sit down. It was determined that he had an NSTEMI and was followed with a cardiac cath that showed 3 vessel CAD with RCA occlusion.  Today he is feeling pretty good. He denies any chest pain, nausea, SOB, palpitations, dizziness, and feeling lightheaded. He was able to get up and take a shower yesterday without any complications.   OBJECTIVE Filed Vitals:   03/23/12 0520 03/23/12 0801 03/23/12 0802 03/23/12 1130  BP:   127/58 123/70  Pulse:    60  Temp: 99.4 F (37.4 C) 98.9 F (37.2 C)  98.9 F (37.2 C)  TempSrc: Oral Oral    Resp:   23 18  Height:      Weight:      SpO2: 97% 96%  99%    Intake/Output Summary (Last 24 hours) at 03/23/12 1232 Last data filed at 03/23/12 0021  Gross per 24 hour  Intake    480 ml  Output   1000 ml  Net   -520 ml   Filed Weights   03/20/12 2045 03/22/12 0024 03/23/12 0021  Weight: 294 lb 9.6 oz (133.63 kg) 297 lb 6.4 oz (134.9 kg) 298 lb 11.6 oz (135.5 kg)     PHYSICAL EXAM General: Well developed, well nourished, male in no acute distress. Head: Normocephalic, atraumatic.  Neck: Supple without bruits, no JVD. Lungs:  Resp regular and unlabored, CTA. Heart: RRR, S1, S2, no S3, S4, or murmur. Pulses 2+ all 4 extremities. Abdomen: Soft, non-tender, non-distended, BS + x 4.  Extremities: No clubbing, cyanosis, edema.  Neuro: Alert and oriented X 3. Moves all extremities spontaneously. Psych: Normal affect.  LABS: CBC:  Basename  03/23/12 0644 03/22/12 0700  WBC 5.9 6.2  NEUTROABS -- --  HGB 9.7* 10.2*  HCT 28.5* 30.2*  MCV 88.5 89.3  PLT 180 186   INR:  Basename 03/21/12 0505  INR 1.13   Basic Metabolic Panel:  Basename 03/22/12 0700 03/21/12 0505  NA 135 132*  K 4.2 4.1  CL 101 98  CO2 22 24  GLUCOSE 190* 200*  BUN 25* 33*  CREATININE 1.89* 2.11*  CALCIUM 8.9 8.9  MG -- --  PHOS -- --   Cardiac Enzymes:  Basename 03/21/12 0505 03/20/12 2334  CKTOTAL -- --  CKMB -- --  CKMBINDEX -- --  TROPONINI 5.79* 4.04*   Hemoglobin A1C:  Basename 03/21/12 0925  HGBA1C 8.3*   Fasting Lipid Panel:  Basename 03/21/12 0505  CHOL 151  HDL 36*  LDLCALC 105*  TRIG 51  CHOLHDL 4.2  LDLDIRECT --   TELE:       Several episodes of first degree and second degree type 1 heart block over night. HR not sustained below 50.  ECG:  22-Mar-2012 06:29:01 Sinus rhythm with 1st degree A-V block Non-specific intra-ventricular conduction block Abnormal ECG 55mm/s 86mm/mV 100Hz  8.0.1 12SL 241 HD CID: 1 Referred by: BRIAN S CRENSHAW Confirmed By: Lorine Bears MD Vent. rate 69 BPM PR interval 220 ms QRS duration 128 ms QT/QTc 402/430  ms P-R-T axes 71 35 44  Radiology/Studies: No results found.  Current Medications:     . amLODipine  5 mg Oral Daily  . aspirin  324 mg Oral Daily  . atorvastatin  80 mg Oral q1800  . insulin aspart  0-15 Units Subcutaneous TID WC  . insulin aspart  0-5 Units Subcutaneous QHS  . isosorbide mononitrate  60 mg Oral Daily  . you have a pacemaker book   Does not apply Once      ASSESSMENT AND PLAN: Principal Problem:  *Non-ST elevation myocardial infarction (NSTEMI), subendocardial infarction, initial episode of care - on ASA, Imdur and Lipitor. For stress testing, probably Monday, to determine effectiveness of treatment. MD advise on type and timing.  On ASA and statin, no BB with 2nd degree heart block and bradycardia.  Active Problems:  Hypertension - Continue  amlodipine.   Dysrhythmia - several episodes of 1st degree and 2nd degree type 1 overnight, generally while asleep. Continue to monitor. No HR sustained below 50, no symptoms.   Chronic kidney disease - Cr and BUN trending down and CrCl improving. BMET daily. Monitor. ACE and HCTZ d/c'd.   DM - on sliding scale insulin. CBGs are trending up, will restart glucotrol and Januvia    Obese - has been losing weight. Counsel on importance of continuing weight loss.    Mobitz type 1 second degree atrioventricular block - continue to monitor.    Barnet Pall , PA-S2 12:32 PM 03/23/2012  Seen and agree with changes made. Theodore Demark, PA 03/23/2012 1:56 PM Patient seen and examined and history reviewed. Agree with above findings and plan. Still having some Wenkebach but no significant bradycardia or pauses. Will monitor off metoprolol. Plan stress Myoview on Monday to assess risk on medical therapy. I reviewed cath films with Dr. Excell Seltzer. If myoview is high risk would probably recommend PCI of RCA and diagonal.   Thedora Hinders 03/23/2012 2:59 PM

## 2012-03-24 LAB — BASIC METABOLIC PANEL
CO2: 23 mEq/L (ref 19–32)
Calcium: 8.8 mg/dL (ref 8.4–10.5)
Creatinine, Ser: 1.9 mg/dL — ABNORMAL HIGH (ref 0.50–1.35)
GFR calc non Af Amer: 36 mL/min — ABNORMAL LOW (ref >90)
Glucose, Bld: 223 mg/dL — ABNORMAL HIGH (ref 70–99)
Sodium: 135 mEq/L (ref 135–145)

## 2012-03-24 LAB — GLUCOSE, CAPILLARY
Glucose-Capillary: 194 mg/dL — ABNORMAL HIGH (ref 70–99)
Glucose-Capillary: 158 mg/dL — ABNORMAL HIGH (ref 70–99)

## 2012-03-24 LAB — CBC
Hemoglobin: 9.7 g/dL — ABNORMAL LOW (ref 13.0–17.0)
MCH: 29.8 pg (ref 26.0–34.0)
MCHC: 33.3 g/dL (ref 30.0–36.0)
RDW: 13.2 % (ref 11.5–15.5)

## 2012-03-24 NOTE — Progress Notes (Signed)
Patient: [name] [name] Date of Encounter: 03/24/2012, 9:09 AM Admit date: 03/20/2012     Subjective  [name] [name] has no complaints this AM. He denies CP, SOB or palpitations.   Objective  Physical Exam: Vitals: BP 123/63  Pulse 61  Temp 99.6 F (37.6 C) (Oral)  Resp 18  Ht 6\' 2"  (1.88 m)  Wt 285 lb 9.6 oz (129.547 kg)  BMI 36.67 kg/m2  SpO2 100% General: Well developed, well appearing 62 year old male in no acute distress. Neck: Supple. JVD not elevated. Lungs: Clear bilaterally to auscultation without wheezes, rales, or rhonchi. Breathing is unlabored. Heart: RRR S1 S2 without murmur, rub or gallop.  Abdomen: Soft, non-distended. Extremities: No clubbing or cyanosis. No edema.  Distal pedal pulses are 2+ and equal bilaterally. Neuro: Alert and oriented X 3. Moves all extremities spontaneously. No focal deficits.  Intake/Output:  Intake/Output Summary (Last 24 hours) at 03/24/12 0909 Last data filed at 03/24/12 0547  Gross per 24 hour  Intake      0 ml  Output    500 ml  Net   -500 ml    Inpatient Medications:     . amLODipine  5 mg Oral Daily  . aspirin  324 mg Oral Daily  . atorvastatin  80 mg Oral q1800  . glipiZIDE  10 mg Oral QAC breakfast  . insulin aspart  0-15 Units Subcutaneous TID WC  . insulin aspart  0-5 Units Subcutaneous QHS  . isosorbide mononitrate  60 mg Oral Daily  . linagliptin  5 mg Oral Daily  . you have a pacemaker book   Does not apply Once    Labs:  Basename 03/22/12 0700  NA 135  K 4.2  CL 101  CO2 22  GLUCOSE 190*  BUN 25*  CREATININE 1.89*  CALCIUM 8.9  MG --  PHOS --    Basename 03/24/12 0530 03/23/12 0644  WBC 6.9 5.9  NEUTROABS -- --  HGB 9.7* 9.7*  HCT 29.1* 28.5*  MCV 89.3 88.5  PLT 178 180    Basename 03/21/12 0925  HGBA1C 8.3*    Radiology/Studies: No results found.  Echocardiogram: Study Conclusions  - Left ventricle: The cavity size was normal. Wall thickness was increased in a pattern of mild LVH.  Systolic function was mildly reduced. The estimated ejection fraction was in the range of 45% to 50%. There is akinesis of the inferior myocardium. Doppler parameters are consistent with abnormal left ventricular relaxation (grade 1 diastolic dysfunction). - Mitral valve: Mild regurgitation.  Telemetry: currently in sinus rhythm; episode of AFib (rate controlled) from 5:10 AM - 8:50 AM today    Assessment and Plan  1. Aborted inferior MI  2. CAD  Now on ASA, Imdur, amlodipine and atorvastatin No BB due to AV block/bradycardia For stress test Monday to assess ischemic burden 3. Mild LV dysfunction, EF 45-50%  4. Atrial fibrillation Brief episode this AM, asymptomatic; s/p spontaneous return to SR  Continue to monitor on telemetry 5. Bradycardia with intermittent second degree AV block, Wenckebach, and 2:1 AV block  Asymptomatic currently Continue to monitor on telemetry 6. Renal insufficiency  Check BMET today 7. Anemia Stable. Follow with repeat CBC in AM.   Dr. Graciela Husbands to see. Signed, EDMISTEN, BROOKE PA-C  The patient has atrial fibrillation this morning. He is CHADS-  2 score   of 2 and a CHADS-VASc score of 3 which would make the appropriate for anticoagulation. With his renal insufficiency I would  be in favor of apixaban defer the initiation of shoulders resolution of the issue of revascularization pending his stress test on Monday.  There is no further heart block. He has has a blocked PACs. Hopefully we have avoided issue of pacing.

## 2012-03-24 NOTE — Care Management Note (Unsigned)
    Page 1 of 1   03/24/2012     11:01:18 AM   CARE MANAGEMENT NOTE 03/24/2012  Patient:  [name] [name]   Account Number:  [id number]  Date Initiated:  03/24/2012  Documentation initiated by:  GRAVES-BIGELOW,Marysue Fait  Subjective/Objective Assessment:   Pt admitted with syncope and cp. Post cath.  Revealed 3 vessel obstructive CAD. Recent RCA occlusion. Aborted inferior MI per MD notes.  For stress test Monday to assess ischemic burden.     Action/Plan:   CM will continue to monitor for disposition needs.   Anticipated DC Date:  03/28/2012   Anticipated DC Plan:  HOME/SELF CARE      DC Planning Services  CM consult      Choice offered to / List presented to:             Status of service:  In process, will continue to follow Medicare Important Message given?   (If response is "NO", the following Medicare IM given date fields will be blank) Date Medicare IM given:   Date Additional Medicare IM given:    Discharge Disposition:    Per UR Regulation:  Reviewed for med. necessity/level of care/duration of stay  If discussed at Long Length of Stay Meetings, dates discussed:    Comments:

## 2012-03-24 NOTE — Progress Notes (Signed)
Patient converted to Atrial Fib per EKG around 6 AM. Patient's vitals signs stable with BP of 123/63, HR 61, 02 100% on RA afebrile. Patient asymptomatic. Dayna Dunn notified and made aware, told to obtain a new EKG for day shift rounding Doctor. Will continue to monitor patient.

## 2012-03-25 DIAGNOSIS — I499 Cardiac arrhythmia, unspecified: Secondary | ICD-10-CM

## 2012-03-25 LAB — CBC
HCT: 28.4 % — ABNORMAL LOW (ref 39.0–52.0)
Hemoglobin: 9.6 g/dL — ABNORMAL LOW (ref 13.0–17.0)
RBC: 3.19 MIL/uL — ABNORMAL LOW (ref 4.22–5.81)

## 2012-03-25 LAB — BASIC METABOLIC PANEL
BUN: 26 mg/dL — ABNORMAL HIGH (ref 6–23)
CO2: 25 mEq/L (ref 19–32)
Chloride: 102 mEq/L (ref 96–112)
Glucose, Bld: 130 mg/dL — ABNORMAL HIGH (ref 70–99)
Potassium: 4.5 mEq/L (ref 3.5–5.1)
Sodium: 138 mEq/L (ref 135–145)

## 2012-03-25 LAB — GLUCOSE, CAPILLARY
Glucose-Capillary: 155 mg/dL — ABNORMAL HIGH (ref 70–99)
Glucose-Capillary: 107 mg/dL — ABNORMAL HIGH (ref 70–99)
Glucose-Capillary: 184 mg/dL — ABNORMAL HIGH (ref 70–99)

## 2012-03-25 NOTE — Progress Notes (Signed)
Patient ID: [name] [name], male   DOB: [birth year], 62 y.o.   MRN: [id number]   Patient Name: [name] [name] Date of Encounter: 03/25/2012  Principal Problem:  *Non-ST elevation myocardial infarction (NSTEMI), subendocardial infarction, initial episode of care Active Problems:  Hypertension  Dysrhythmia  Chronic kidney disease  Obese  Mobitz type 1 second degree atrioventricular block    SUBJECTIVE: [name] [name] is a 62 year old male with history of CAD, HTN, CKD, and DM who was admitted for chest tightness and syncope. He had associated nausea and diaphoresis. He became dizzy and had to sit down. It was determined that he had an NSTEMI and was followed with a cardiac cath that showed 3 vessel CAD with RCA occlusion.  No complaints Ambulating in [place]  OBJECTIVE Filed Vitals:   03/24/12 0545 03/24/12 1341 03/24/12 2100 03/25/12 0500  BP: 123/63 130/71 179/87 119/68  Pulse: 61 65 61 90  Temp: 99.6 F (37.6 C) 98.6 F (37 C) 99.4 F (37.4 C) 99.7 F (37.6 C)  TempSrc: Oral Oral    Resp:  18 20 18   Height:      Weight: 285 lb 9.6 oz (129.547 kg)     SpO2: 100% 100% 97% 98%   No intake or output data in the 24 hours ending 03/25/12 0924 Filed Weights   03/22/12 0024 03/23/12 0021 03/24/12 0545  Weight: 297 lb 6.4 oz (134.9 kg) 298 lb 11.6 oz (135.5 kg) 285 lb 9.6 oz (129.547 kg)     PHYSICAL EXAM General: Well developed, well nourished, male in no acute distress. Head: Normocephalic, atraumatic.  Neck: Supple without bruits, no JVD. Lungs:  Resp regular and unlabored, CTA. Heart: RRR, S1, S2, SEM murmur. Pulses 2+ all 4 extremities. Abdomen: Soft, non-tender, non-distended, BS + x 4.  Extremities: No clubbing, cyanosis, edema.  Neuro: Alert and oriented X 3. Moves all extremities spontaneously. Psych: Normal affect.  LABS: CBC:  Basename 03/25/12 0525 03/24/12 0530  WBC 6.1 6.9  NEUTROABS -- --  HGB 9.6* 9.7*  HCT 28.4* 29.1*  MCV 89.0 89.3  PLT 174 178   INR: No  results found for this basename: INR in the last 72 hours Basic Metabolic Panel:  Basename 03/25/12 0525 03/24/12 0815  NA 138 135  K 4.5 4.1  CL 102 101  CO2 25 23  GLUCOSE 130* 223*  BUN 26* 22  CREATININE 2.14* 1.90*  CALCIUM 8.9 8.8  MG -- --  PHOS -- --   TELE:       Several episodes of first degree and second degree type 1 heart block over night. HR not sustained below 50.  ECG:  22-Mar-2012 06:29:01 Sinus rhythm with 1st degree A-V block Non-specific intra-ventricular conduction block Abnormal ECG 34mm/s 32mm/mV 100Hz  8.0.1 12SL 241 HD CID: 1 Referred by: BRIAN S CRENSHAW Confirmed By: Lorine Bears MD Vent. rate 69 BPM PR interval 220 ms QRS duration 128 ms QT/QTc 402/430 ms P-R-T axes 71 35 44  Radiology/Studies: No results found.  Current Medications:     . amLODipine  5 mg Oral Daily  . aspirin  324 mg Oral Daily  . atorvastatin  80 mg Oral q1800  . glipiZIDE  10 mg Oral QAC breakfast  . insulin aspart  0-15 Units Subcutaneous TID WC  . insulin aspart  0-5 Units Subcutaneous QHS  . isosorbide mononitrate  60 mg Oral Daily  . linagliptin  5 mg Oral Daily  . you have a pacemaker book   Does not  apply Once      ASSESSMENT AND PLAN: Principal Problem:  *Non-ST elevation myocardial infarction (NSTEMI), subendocardial infarction, initial episode of care - on ASA, Imdur and Lipitor. For stress testing, probably Monday, to determine effectiveness of treatment. Should be able To exercise on treadmill   On ASA and statin, no BB with 2nd degree heart block and bradycardia.  Active Problems:  Hypertension - Continue amlodipine.   Dysrhythmia - several episodes of 1st degree and 2nd degree type 1 overnight, generally while asleep. Continue to monitor. No HR sustained below 50, no symptoms.   Chronic kidney disease - Cr and BUN trending down and CrCl improving. BMET daily. Monitor. ACE and HCTZ d/c'd.   DM - on sliding scale insulin. CBGs are trending up,  will restart glucotrol and Januvia    Obese - has been losing weight. Counsel on importance of continuing weight loss.    Mobitz type 1 second degree atrioventricular block - continue to monitor. Per SK no pacer for now Will see how HR does during ETT   [employer]

## 2012-03-26 LAB — BASIC METABOLIC PANEL
Calcium: 9.2 mg/dL (ref 8.4–10.5)
Creatinine, Ser: 2.2 mg/dL — ABNORMAL HIGH (ref 0.50–1.35)
GFR calc Af Amer: 35 mL/min — ABNORMAL LOW (ref >90)
GFR calc non Af Amer: 30 mL/min — ABNORMAL LOW (ref >90)
Sodium: 134 mEq/L — ABNORMAL LOW (ref 135–145)

## 2012-03-26 LAB — CBC
MCH: 30 pg (ref 26.0–34.0)
MCV: 89.6 fL (ref 78.0–100.0)
Platelets: 210 10*3/uL (ref 150–400)
RBC: 3.47 MIL/uL — ABNORMAL LOW (ref 4.22–5.81)
RDW: 13.2 % (ref 11.5–15.5)
WBC: 6.8 10*3/uL (ref 4.0–10.5)

## 2012-03-26 NOTE — Progress Notes (Signed)
Patient ID: [name] [name], male   DOB: 11-05-1950, 62 y.o.   MRN: [id number]   Patient Name: [name] [name] Date of Encounter: 03/26/2012  Principal Problem:  *Non-ST elevation myocardial infarction (NSTEMI), subendocardial infarction, initial episode of care Active Problems:  Hypertension  Dysrhythmia  Chronic kidney disease  Obese  Mobitz type 1 second degree atrioventricular block    SUBJECTIVE: [name] [name] is a 62 year old male with history of CAD, HTN, CKD, and DM who was admitted for chest tightness and syncope. He had associated nausea and diaphoresis. He became dizzy and had to sit down. It was determined that he had an NSTEMI and was followed with a cardiac cath that showed 3 vessel CAD with RCA occlusion.  No complaints Ambulating in [place]  OBJECTIVE Filed Vitals:   03/25/12 1350 03/25/12 1531 03/25/12 2129 03/26/12 0604  BP: 120/70  127/56 123/64  Pulse: 71 65 66 78  Temp: 98.8 F (37.1 C) 99.8 F (37.7 C) 98.6 F (37 C) 98.4 F (36.9 C)  TempSrc:  Oral Oral Oral  Resp: 17  18 18   Height:      Weight:    289 lb 4.8 oz (131.226 kg)  SpO2: 98% 98% 99% 100%    Intake/Output Summary (Last 24 hours) at 03/26/12 0833 Last data filed at 03/25/12 1751  Gross per 24 hour  Intake    480 ml  Output      0 ml  Net    480 ml   Filed Weights   03/23/12 0021 03/24/12 0545 03/26/12 0604  Weight: 298 lb 11.6 oz (135.5 kg) 285 lb 9.6 oz (129.547 kg) 289 lb 4.8 oz (131.226 kg)     PHYSICAL EXAM General: Well developed, well nourished, male in no acute distress. Head: Normocephalic, atraumatic.  Neck: Supple without bruits, no JVD. Lungs:  Resp regular and unlabored, CTA. Heart: RRR, S1, S2, SEM murmur. Pulses 2+ all 4 extremities. Abdomen: Soft, non-tender, non-distended, BS + x 4.  Extremities: No clubbing, cyanosis, edema.  Neuro: Alert and oriented X 3. Moves all extremities spontaneously. Psych: Normal affect.  LABS: CBC:  Basename 03/26/12 0600 03/25/12 0525  WBC  6.8 6.1  NEUTROABS -- --  HGB 10.4* 9.6*  HCT 31.1* 28.4*  MCV 89.6 89.0  PLT 210 174   INR: No results found for this basename: INR in the last 72 hours Basic Metabolic Panel:  Basename 03/26/12 0600 03/25/12 0525  NA 134* 138  K 4.3 4.5  CL 99 102  CO2 24 25  GLUCOSE 172* 130*  BUN 30* 26*  CREATININE 2.20* 2.14*  CALCIUM 9.2 8.9  MG -- --  PHOS -- --   TELE:       Several episodes of first degree and second degree type 1 heart block over night. HR not sustained below 50.  ECG:  22-Mar-2012 06:29:01 Sinus rhythm with 1st degree A-V block Non-specific intra-ventricular conduction block Abnormal ECG 35mm/s 78mm/mV 100Hz  8.0.1 12SL 241 HD CID: 1 Referred by: BRIAN S CRENSHAW Confirmed By: Lorine Bears MD Vent. rate 69 BPM PR interval 220 ms QRS duration 128 ms QT/QTc 402/430 ms P-R-T axes 71 35 44  Radiology/Studies: No results found.  Current Medications:     . amLODipine  5 mg Oral Daily  . aspirin  324 mg Oral Daily  . atorvastatin  80 mg Oral q1800  . glipiZIDE  10 mg Oral QAC breakfast  . insulin aspart  0-15 Units Subcutaneous TID WC  . insulin aspart  0-5 Units Subcutaneous QHS  . isosorbide mononitrate  60 mg Oral Daily  . linagliptin  5 mg Oral Daily  . you have a pacemaker book   Does not apply Once      ASSESSMENT AND PLAN: Principal Problem:  *Non-ST elevation myocardial infarction (NSTEMI), subendocardial infarction, initial episode of care - on ASA, Imdur and Lipitor. For stress testing, Monday ( test ordered) , to determine effectiveness of treatment. Should be able To exercise on treadmill   On ASA and statin, no BB with 2nd degree heart block and bradycardia.  Active Problems:  Hypertension - Continue amlodipine.   Dysrhythmia - several episodes of 1st degree and 2nd degree type 1 overnight, generally while asleep. Continue to monitor. No HR sustained below 50, no symptoms.   Chronic kidney disease - Cr and BUN trending down and  CrCl improving. BMET daily. Monitor. ACE and HCTZ d/c'd.   DM - on sliding scale insulin. CBGs are trending up, will restart glucotrol and Januvia    Obese - has been losing weight. Counsel on importance of continuing weight loss.    Mobitz type 1 second degree atrioventricular block - continue to monitor. Per SK no pacer for now Will see how HR does during ETT   [employer]

## 2012-03-27 ENCOUNTER — Inpatient Hospital Stay (HOSPITAL_COMMUNITY): Payer: BC Managed Care – PPO

## 2012-03-27 DIAGNOSIS — R079 Chest pain, unspecified: Secondary | ICD-10-CM

## 2012-03-27 DIAGNOSIS — I10 Essential (primary) hypertension: Secondary | ICD-10-CM

## 2012-03-27 DIAGNOSIS — E669 Obesity, unspecified: Secondary | ICD-10-CM

## 2012-03-27 LAB — CBC
HCT: 27.5 % — ABNORMAL LOW (ref 39.0–52.0)
MCH: 30 pg (ref 26.0–34.0)
MCHC: 33.8 g/dL (ref 30.0–36.0)
MCV: 88.7 fL (ref 78.0–100.0)
Platelets: 187 10*3/uL (ref 150–400)
RDW: 13 % (ref 11.5–15.5)

## 2012-03-27 LAB — GLUCOSE, CAPILLARY
Glucose-Capillary: 156 mg/dL — ABNORMAL HIGH (ref 70–99)
Glucose-Capillary: 202 mg/dL — ABNORMAL HIGH (ref 70–99)

## 2012-03-27 MED ORDER — TECHNETIUM TC 99M SESTAMIBI GENERIC - CARDIOLITE
30.0000 | Freq: Once | INTRAVENOUS | Status: AC | PRN
  Administered 2012-03-27: 30 via INTRAVENOUS

## 2012-03-27 MED ORDER — REGADENOSON 0.4 MG/5ML IV SOLN
INTRAVENOUS | Status: AC
  Administered 2012-03-27: 0.4 mg via INTRAVENOUS
  Filled 2012-03-27: qty 5

## 2012-03-27 MED ORDER — REGADENOSON 0.4 MG/5ML IV SOLN
0.4000 mg | Freq: Once | INTRAVENOUS | Status: AC
  Administered 2012-03-27: 0.4 mg via INTRAVENOUS

## 2012-03-27 NOTE — Progress Notes (Signed)
Patient Name: [name] [name] Date of Encounter: 03/27/2012  Principal Problem:  *Non-ST elevation myocardial infarction (NSTEMI), subendocardial infarction, initial episode of care Active Problems:  Hypertension  Dysrhythmia  Chronic kidney disease  Obese  Mobitz type 1 second degree atrioventricular block    SUBJECTIVE: [name] [name] is a 62 year old male with history of CAD, HTN, CKD, and DM who was admitted for chest tightness and syncope. He had associated nausea and diaphoresis. He became dizzy and had to sit down. It was determined that he had an NSTEMI and was followed with a cardiac cath that showed 3 vessel CAD with RCA occlusion.  Today he is feeling pretty well. He denies any chest pain, nausea, SOB, palpitations, dizziness, and feeling lightheaded. He ambulated extensively in halls this weekend.  OBJECTIVE Filed Vitals:   03/26/12 1100 03/26/12 1423 03/26/12 2117 03/27/12 0556  BP: 150/79 103/61 133/70 110/62  Pulse:  99 84 59  Temp:  98.9 F (37.2 C) 98.9 F (37.2 C) 99.1 F (37.3 C)  TempSrc:   Oral Oral  Resp:  20 18 16   Height:      Weight:      SpO2:  100% 100% 97%    Intake/Output Summary (Last 24 hours) at 03/27/12 0755 Last data filed at 03/26/12 1742  Gross per 24 hour  Intake    600 ml  Output      0 ml  Net    600 ml   Filed Weights   03/23/12 0021 03/24/12 0545 03/26/12 0604  Weight: 298 lb 11.6 oz (135.5 kg) 285 lb 9.6 oz (129.547 kg) 289 lb 4.8 oz (131.226 kg)     PHYSICAL EXAM General: Well developed, well nourished, male in no acute distress. Head: Normocephalic, atraumatic.  Neck: Supple without bruits, no JVD. Lungs:  Resp regular and unlabored, CTA. Heart: RRR, S1, S2, no S3, S4, or murmur. Pulses 2+ all 4 extremities. Abdomen: Soft, non-tender, non-distended, BS + x 4.  Extremities: No clubbing, cyanosis, edema.  Neuro: Alert and oriented X 3. Moves all extremities spontaneously. Psych: Normal affect.  LABS: CBC:  Basename  03/27/12 0545 03/26/12 0600  WBC 5.4 6.8  NEUTROABS -- --  HGB 9.3* 10.4*  HCT 27.5* 31.1*  MCV 88.7 89.6  PLT 187 210   INR: No results found for this basename: INR in the last 72 hours Basic Metabolic Panel:  Basename 03/26/12 0600 03/25/12 0525  NA 134* 138  K 4.3 4.5  CL 99 102  CO2 24 25  GLUCOSE 172* 130*  BUN 30* 26*  CREATININE 2.20* 2.14*  CALCIUM 9.2 8.9  MG -- --  PHOS -- --   TELE:       NSR with Mobitz type 1 second degree heart block- no Afib that I can see.  ECG:  22-Mar-2012 06:29:01 Sinus rhythm with 1st degree A-V block Non-specific intra-ventricular conduction block Abnormal ECG 19mm/s 22mm/mV 100Hz  8.0.1 12SL 241 HD CID: 1 Referred by: BRIAN S CRENSHAW Confirmed By: Lorine Bears MD Vent. rate 69 BPM PR interval 220 ms QRS duration 128 ms QT/QTc 402/430 ms P-R-T axes 71 35 44  Radiology/Studies: No results found.  Current Medications:     . amLODipine  5 mg Oral Daily  . aspirin  324 mg Oral Daily  . atorvastatin  80 mg Oral q1800  . glipiZIDE  10 mg Oral QAC breakfast  . insulin aspart  0-15 Units Subcutaneous TID WC  . insulin aspart  0-5 Units Subcutaneous QHS  .  isosorbide mononitrate  60 mg Oral Daily  . linagliptin  5 mg Oral Daily  . you have a pacemaker book   Does not apply Once      ASSESSMENT AND PLAN: Principal Problem:  *Non-ST elevation myocardial infarction (NSTEMI), subendocardial infarction, initial episode of care - on ASA, Imdur,amlodipine, and Lipitor. For stress Myoview today to determine effectiveness of treatment. No BB with 2nd degree heart block and bradycardia. If high risk stress test would favor PCI of RCA and diagonal. Would need overnight hydration. Would need to load with Plavix. If stress Myoview is low risk would treat medically.  Active Problems: Hypertension - Continue amlodipine. ACEi and HCTZ held due to CKD.  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 No HR sustained below 50, no  symptoms.  Chronic kidney disease stage 3- Stable   DM type 2- on sliding scale insulin. Glucotrol and Januvia resumed. Glucose 109-224.  Obese - has been losing weight. Counsel on importance of continuing weight loss.   Atrial fibrillation-reported by Dr. Graciela Husbands on 03/24/12. I see no evidence of recurrence since. Will hold anticoagulation until we know whether he will require PCI.  Mild LV dysfunction- EF 45-50%  Chronic anemia. Hgb has dropped some but evidence of active bleeding. Will check stools.  Signed, Peter Sw[place], MD,FACC 03/27/2012 7:55 AM

## 2012-03-27 NOTE — Progress Notes (Signed)
Stress myoview completed (day 1 of 2). Patient unable to reach target heart rate with exercise so changed to Lexiscan. EKG difficult to interpret while walking due to artifact, but showed Atrial bigeminy vs 2nd degree AVB type 1 vs A.fib. At rest it showed Atrial Bigeminy. He was asymptomatic with stable vitals.  [place], PA-C 03/27/2012 11:07 AM  [phone] pgr

## 2012-03-28 ENCOUNTER — Other Ambulatory Visit: Payer: Self-pay | Admitting: Nurse Practitioner

## 2012-03-28 ENCOUNTER — Encounter (HOSPITAL_COMMUNITY): Payer: BC Managed Care – PPO

## 2012-03-28 DIAGNOSIS — E785 Hyperlipidemia, unspecified: Secondary | ICD-10-CM

## 2012-03-28 DIAGNOSIS — R55 Syncope and collapse: Secondary | ICD-10-CM

## 2012-03-28 DIAGNOSIS — I441 Atrioventricular block, second degree: Secondary | ICD-10-CM

## 2012-03-28 DIAGNOSIS — I480 Paroxysmal atrial fibrillation: Secondary | ICD-10-CM

## 2012-03-28 DIAGNOSIS — I214 Non-ST elevation (NSTEMI) myocardial infarction: Secondary | ICD-10-CM

## 2012-03-28 DIAGNOSIS — E119 Type 2 diabetes mellitus without complications: Secondary | ICD-10-CM

## 2012-03-28 DIAGNOSIS — R001 Bradycardia, unspecified: Secondary | ICD-10-CM

## 2012-03-28 DIAGNOSIS — I2510 Atherosclerotic heart disease of native coronary artery without angina pectoris: Secondary | ICD-10-CM

## 2012-03-28 DIAGNOSIS — I255 Ischemic cardiomyopathy: Secondary | ICD-10-CM

## 2012-03-28 DIAGNOSIS — N183 Chronic kidney disease, stage 3 unspecified: Secondary | ICD-10-CM

## 2012-03-28 LAB — CBC
HCT: 28.3 % — ABNORMAL LOW (ref 39.0–52.0)
Hemoglobin: 9.7 g/dL — ABNORMAL LOW (ref 13.0–17.0)
MCH: 30.3 pg (ref 26.0–34.0)
MCV: 88.4 fL (ref 78.0–100.0)
RBC: 3.2 MIL/uL — ABNORMAL LOW (ref 4.22–5.81)

## 2012-03-28 LAB — GLUCOSE, CAPILLARY: Glucose-Capillary: 179 mg/dL — ABNORMAL HIGH (ref 70–99)

## 2012-03-28 MED ORDER — CLOPIDOGREL BISULFATE 75 MG PO TABS: 75.0000 mg | ORAL_TABLET | Freq: Every day | ORAL | Status: DC

## 2012-03-28 MED ORDER — TECHNETIUM TC 99M SESTAMIBI GENERIC - CARDIOLITE
30.0000 | Freq: Once | INTRAVENOUS | Status: AC | PRN
  Administered 2012-03-28: 30 via INTRAVENOUS

## 2012-03-28 MED ORDER — ISOSORBIDE MONONITRATE ER 60 MG PO TB24: 60.0000 mg | ORAL_TABLET | Freq: Every day | ORAL | Status: DC

## 2012-03-28 MED ORDER — TECHNETIUM TC 99M SESTAMIBI GENERIC - CARDIOLITE
30.0000 | Freq: Once | INTRAVENOUS | Status: AC | PRN
  Administered 2012-03-27: 30 via INTRAVENOUS

## 2012-03-28 MED ORDER — ATORVASTATIN CALCIUM 80 MG PO TABS: 80.0000 mg | ORAL_TABLET | Freq: Every day | ORAL | Status: DC

## 2012-03-28 MED ORDER — ASPIRIN EC 81 MG PO TBEC: 81.0000 mg | DELAYED_RELEASE_TABLET | Freq: Every day | ORAL | Status: DC

## 2012-03-28 MED ORDER — AMLODIPINE BESYLATE 5 MG PO TABS: 5.0000 mg | ORAL_TABLET | Freq: Every day | ORAL | Status: DC

## 2012-03-28 NOTE — Discharge Summary (Signed)
Patient seen and examined and history reviewed. Agree with above findings and plan. See earlier rounding note. Myoview study is low risk with a small inferobasal scar. This would argue for continued medical therapy.  Thedora Hinders 03/28/2012 6:14 PM

## 2012-03-28 NOTE — Progress Notes (Signed)
D/c orders received, IV removed with gauze on, pt remains in stable condition, pt meds and instructions reviewed and given to pt; pt d/c to home 

## 2012-03-28 NOTE — Discharge Summary (Signed)
[name] [name] ID: [name] [name],  MRN: [id number], DOB/AGE: [birth year] 62 y.o.  Admit date: 03/20/2012 Discharge date: 03/28/2012  Primary Cardiologist: P. Swaz[place], MD  Discharge Diagnoses Principal Problem:  *Non-ST elevation myocardial infarction (NSTEMI), subendocardial infarction, initial episode of care  **S/P Cath this admission revealing multivessel CAD with occluded RCA.  Myoview showed inferior and inferoseptal infarct with minimal peri-infarct ischemia.  Active Problems:  Pre-syncope - in setting of NSTEMI/angina  CAD (coronary artery disease)  Stage III Chronic kidney disease  Cardiomyopathy, ischemic  **No ACEI/ARB at this time in setting of CKD and contrast load this admission.  Diabetes mellitus type II, controlled  Hypertension  PAF (paroxysmal atrial fibrillation)  **Brief - no oral anticoagulation at this point.  Hyperlipidemia  Obese  Bradycardia/ Mobitz type II atrioventricular block/ Mobitz type 1 second degree atrioventricular block  **Unable to initiate beta blocker post-MI.  Allergies No Known Allergies  Procedures  Cardiac Catheterization 03/21/2012  Procedural Findings: Hemodynamics: AO 150/68 with a mean of 96 mm Hg LV 151/21 mm Hg  Coronary angiography: Coronary dominance: right  Left mainstem: 30% distal left main. Left anterior descending (LAD): Mild wall irregularities. There is a large diagonal with a 90-95% ostial lesion. Left circumflex (LCx): The left circumflex gives rise to a single large marginal branch. There is a 70-80% ostial LCX lesion. Right coronary artery (RCA): The RCA is occluded proximally with evidence of recent thrombus. There are left to right collaterals to the distal RCA.  Left ventriculography: Not performed.  Final Conclusions:   1. 3 vessel obstructive CAD. Recent RCA occlusion.  Recommendations: Would maximize medical therapy. Consider stress myoview on medical therapy to assess ischemic burden and symptoms. Ostial location  of diagonal and LCX disease is not optimal for PCI. If significant symptoms or ischemia on medical therapy may need to consider CABG. _____________  2D Echocardiogram 03/21/2012  Study Conclusions  - Left ventricle: The cavity size was normal. Wall thickness   was increased in a pattern of mild LVH. Systolic function   was mildly reduced. The estimated ejection fraction was in   the range of 45% to 50%. There is akinesis of the inferior   myocardium. Doppler parameters are consistent with   abnormal left ventricular relaxation (grade 1 diastolic   dysfunction). - Mitral valve: Mild regurgitation. _____________  Marlane Hatcher 03/27/2012 (2 Day protocol)  IMPRESSION: 1. Mild LV systolic dysfunction, EF 42% with inferior and inferoseptal hypokinesis. 2. Fixed medium-sized, severe basal to mid inferior and inferoseptal perfusion defect.  This suggests prior infarction with minimal ischemia. 3. Intermediate risk study _____________  History of Present Illness  [name] [name] with the above complex problem list.  [name] [name] had no prior h/o CAD.  [name] [name] was in his USOH until the day of admission when [name] [name] developed sudden nausea and pre-syncope with mild chest discomfort while unloading his truck.  It was reported that [name] [name] experienced syncope, though the [name] [name] denied this.  Regardless, due to ongoing symptoms, [name] [name] presented to [facility] where [name] [name] was found to have mild troponin elevation.  Decision was made to transfer him to [facility] for further evaluation.  Hospital Course  Upon arrival to [facility], [name] [name] continued to c/o mild chest discomfort.  [name] [name] was placed on heparin and nitrates.  Beta-blocker therapy was withheld secondary to baseline bradycardia.  Pt ruled in for NSTEMI, eventually peaking his troponin at 5.79.  [name] [name] underwent 2D echo on 1/21 revealing reduced LV function, with an EF of 45-50% and akinesis  of the inferior myocardium.  Following hydration, in the setting of stage  III CKD with a creatinine of 2.11, [name] [name] underwent diagnostic catheterization on 1/21 revealing significant multivessel CAD involving the RCA (occluded), ostial LCX, and a large diagonal branch.  The RCA was felt to be the culprit vessel, with evidence of fresh thrombus but also left to right collaterals.  It was felt that pt would benefit from optimization of medical therapy followed by stress testing in order to assess his overall ischemic burden.    Following catheterization, [name] [name] had no further chest pain.  [name] [name] was noted on the monitor to have bradycardia with intermittent mobitz I and II heart block.  All of this was asymptomatic.  [name] [name] also had a brief episode of paroxysmal atrial fibrillation.  EP was consulted and felt that in the context of his inferior MI, heart block should be self-limited and would not require pacing.  Because of bradycardia however, we were unable to initiate beta-blocker therapy.  Further, it was felt that his nausea and pre-syncope upon presentation was likely a result of his RCA occlusion/inferior MI.  As [name] [name] has had no further atrial fibrillation, we have not initiated long-term oral anticoagulation at this point, but would in the future if [name] [name] were to have recurrence of afib.  After ambulating without difficultly all weekend and showing no further evidence of significant bradycardia, [name] [name] underwent a 2 day lexiscan cardiolite on 1/27 and 1/28.  Perfusion imaging revealed a fixed medium-sized, inferior and inferoseptal perfusion defect suggestive of prior infarction with minimal peri-infarct ischemia.  Imaging was reviewed and it was felt that continued medical therapy, rather than percutaneous intervention or CABG, is warranted.  [name] [name] will be discharged home today in good condition.  We have arranged for f/u in our office in 1 week at which time we will repeat a basic metabolic profile given rise in creatinine in the post-contrast setting.  Discharge Vitals Blood  pressure 118/60, pulse 53, temperature 98.3 F (36.8 C), temperature source Oral, resp. rate 18, height 6\' 2"  (1.88 m), weight 289 lb 4.8 oz (131.226 kg), SpO2 98.00%.  Filed Weights   03/23/12 0021 03/24/12 0545 03/26/12 0604  Weight: 298 lb 11.6 oz (135.5 kg) 285 lb 9.6 oz (129.547 kg) 289 lb 4.8 oz (131.226 kg)   Labs  CBC  Basename 03/28/12 0618 03/27/12 0545  WBC 5.6 5.4  NEUTROABS -- --  HGB 9.7* 9.3*  HCT 28.3* 27.5*  MCV 88.4 88.7  PLT 198 187   Basic Metabolic Panel  Basename 03/26/12 0600  NA 134*  K 4.3  CL 99  CO2 24  GLUCOSE 172*  BUN 30*  CREATININE 2.20*  CALCIUM 9.2  MG --  PHOS --   Cardiac Enzymes Lab Results  Component Value Date   TROPONINI 5.79* 03/21/2012   Hemoglobin A1C Lab Results  Component Value Date   HGBA1C 8.3* 03/21/2012   Fasting Lipid Panel Lab Results  Component Value Date   CHOL 151 03/21/2012   HDL 36* 03/21/2012   LDLCALC 105* 03/21/2012   TRIG 51 03/21/2012   CHOLHDL 4.2 03/21/2012   Disposition  Pt is being discharged home today in good condition.  Follow-up Plans & Appointments  Follow-up Information    Follow up with Peter Swaziland, MD. (we will arrange for f/u with Dr. Swaziland or his Nurse Practitioner within 7 days and contact you.)    Contact information:   [address] [place] [address]  [phone]         Discharge Medications    Medication List     As of 03/28/2012  5:20 PM    STOP taking these medications         hydrochlorothiazide 25 MG tablet   Commonly known as: HYDRODIURIL      lisinopril 40 MG tablet   Commonly known as: PRINIVIL,ZESTRIL      pioglitazone 30 MG tablet   Commonly known as: ACTOS      pravastatin 40 MG tablet   Commonly known as: PRAVACHOL      TAKE these medications         amLODipine 5 MG tablet   Commonly known as: NORVASC   Take 1 tablet (5 mg total) by mouth daily.      aspirin EC 81 MG tablet   Take 1 tablet (81 mg total) by mouth daily.       atorvastatin 80 MG tablet   Commonly known as: LIPITOR   Take 1 tablet (80 mg total) by mouth daily at 6 PM.      clopidogrel 75 MG tablet   Commonly known as: PLAVIX   Take 1 tablet (75 mg total) by mouth daily.      glipiZIDE 10 MG 24 hr tablet   Commonly known as: GLUCOTROL XL   Take 10 mg by mouth daily.      isosorbide mononitrate 60 MG 24 hr tablet   Commonly known as: IMDUR   Take 1 tablet (60 mg total) by mouth daily.      multivitamin with minerals Tabs   Take 1 tablet by mouth daily.      sitaGLIPtin 50 MG tablet   Commonly known as: JANUVIA   Take 50 mg by mouth daily.      Outstanding Labs/Studies  bmet @ f/u.  Duration of Discharge Encounter   Greater than 30 minutes including physician time.  Signed, Nicolasa Ducking NP 03/28/2012, 5:20 PM

## 2012-03-28 NOTE — Progress Notes (Signed)
Patient Name: [name] [name] Date of Encounter: 03/28/2012  Principal Problem:  *Non-ST elevation myocardial infarction (NSTEMI), subendocardial infarction, initial episode of care Active Problems:  Hypertension  Dysrhythmia  Chronic kidney disease  Obese  Mobitz type 1 second degree atrioventricular block    SUBJECTIVE: [name] [name] is a 62 year old male with history of CAD, HTN, CKD, and DM who was admitted for chest tightness and syncope. He had associated nausea and diaphoresis. He became dizzy and had to sit down. It was determined that he had an NSTEMI and was followed with a cardiac cath that showed 3 vessel CAD with RCA occlusion.  Feels well. He denies any chest pain, nausea, SOB, palpitations, dizziness, and feeling lightheaded. He ambulated extensively in halls.  OBJECTIVE Filed Vitals:   03/27/12 1028 03/27/12 1300 03/27/12 2100 03/28/12 0650  BP: 130/56 130/75 111/56 107/70  Pulse:  60 58 61  Temp:  98.5 F (36.9 C) 98.8 F (37.1 C) 98.4 F (36.9 C)  TempSrc:  Oral Oral Oral  Resp:  18 18 18   Height:      Weight:      SpO2:  99% 98% 100%    Intake/Output Summary (Last 24 hours) at 03/28/12 0718 Last data filed at 03/27/12 1700  Gross per 24 hour  Intake    360 ml  Output    400 ml  Net    -40 ml   Filed Weights   03/23/12 0021 03/24/12 0545 03/26/12 0604  Weight: 298 lb 11.6 oz (135.5 kg) 285 lb 9.6 oz (129.547 kg) 289 lb 4.8 oz (131.226 kg)    PHYSICAL EXAM General: Well developed, well nourished, male in no acute distress. Head: Normocephalic, atraumatic.  Neck: Supple without bruits, no JVD. Lungs:  Resp regular and unlabored, CTA. Heart: RRR, S1, S2, no S3, S4, or murmur. Pulses 2+ all 4 extremities. Abdomen: Soft, non-tender, non-distended, BS + x 4.  Extremities: No clubbing, cyanosis, edema.  Neuro: Alert and oriented X 3. Moves all extremities spontaneously. Psych: Normal affect.  LABS: CBC:  Basename 03/28/12 0618 03/27/12 0545  WBC 5.6  5.4  NEUTROABS -- --  HGB 9.7* 9.3*  HCT 28.3* 27.5*  MCV 88.4 88.7  PLT 198 187   INR: No results found for this basename: INR in the last 72 hours Basic Metabolic Panel:  Basename 03/26/12 0600  NA 134*  K 4.3  CL 99  CO2 24  GLUCOSE 172*  BUN 30*  CREATININE 2.20*  CALCIUM 9.2  MG --  PHOS --   TELE:       NSR with Mobitz type 1 second degree heart block- no Afib   ECG:  22-Mar-2012 06:29:01 Sinus rhythm with 1st degree A-V block Non-specific intra-ventricular conduction block Abnormal ECG 43mm/s 43mm/mV 100Hz  8.0.1 12SL 241 HD CID: 1 Referred by: BRIAN S CRENSHAW Confirmed By: Lorine Bears MD Vent. rate 69 BPM PR interval 220 ms QRS duration 128 ms QT/QTc 402/430 ms P-R-T axes 71 35 44  Radiology/Studies: No results found.  Current Medications:     . amLODipine  5 mg Oral Daily  . aspirin  324 mg Oral Daily  . atorvastatin  80 mg Oral q1800  . glipiZIDE  10 mg Oral QAC breakfast  . insulin aspart  0-15 Units Subcutaneous TID WC  . insulin aspart  0-5 Units Subcutaneous QHS  . isosorbide mononitrate  60 mg Oral Daily  . linagliptin  5 mg Oral Daily  . you have a pacemaker book  Does not apply Once      ASSESSMENT AND PLAN: Principal Problem:  *Non-ST elevation myocardial infarction (NSTEMI), subendocardial infarction, initial episode of care - on ASA, Imdur,amlodipine, and Lipitor. Lexiscan myoview to be completed today (2 day study). No BB with 2nd degree heart block and bradycardia. If high risk stress test would favor PCI of RCA and diagonal. Would need overnight hydration. Would need to load with Plavix. If stress Myoview is low risk would treat medically and discharge today.  Active Problems: Hypertension - Continue amlodipine. ACEi and HCTZ held due to CKD.  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 No HR sustained below 50, no symptoms.  Chronic kidney disease stage 3- Stable   DM type 2- on sliding scale insulin. Glucotrol and Januvia  resumed. Glucose [id number].  Obese - has been losing weight. Counsel on importance of continuing weight loss.   Atrial fibrillation-reported by Dr. Graciela Husbands on 03/24/12. I see no evidence of recurrence since. Will hold anticoagulation until we know whether he will require PCI.  Mild LV dysfunction- EF 45-50%  Chronic anemia. Hgb stable today  Signed, Zaineb Nowaczyk [place], MD,FACC 03/28/2012 7:18 AM

## 2012-03-30 ENCOUNTER — Telehealth: Payer: Self-pay | Admitting: Cardiology

## 2012-03-30 NOTE — Telephone Encounter (Signed)
New Problem    Per After hours VM TOC appt made on 04/03/12 at 8:00 AM.

## 2012-03-30 NOTE — Telephone Encounter (Signed)
LMTCB ./CY 

## 2012-03-31 NOTE — Telephone Encounter (Signed)
Pt states that he is doing well since his hosp d/c on 03/20/12 and that he has all of his medications. Pt is aware of f/u with Norma Fredrickson, NP on 04/03/12 @ 8 am.

## 2012-04-03 ENCOUNTER — Encounter: Payer: Self-pay | Admitting: Nurse Practitioner

## 2012-04-03 ENCOUNTER — Telehealth (HOSPITAL_COMMUNITY): Payer: Self-pay | Admitting: *Deleted

## 2012-04-03 ENCOUNTER — Ambulatory Visit (INDEPENDENT_AMBULATORY_CARE_PROVIDER_SITE_OTHER): Payer: BC Managed Care – PPO | Admitting: Nurse Practitioner

## 2012-04-03 VITALS — BP 130/76 | HR 64 | Ht 74.0 in | Wt 291.4 lb

## 2012-04-03 DIAGNOSIS — I214 Non-ST elevation (NSTEMI) myocardial infarction: Secondary | ICD-10-CM

## 2012-04-03 LAB — BASIC METABOLIC PANEL
BUN: 21 mg/dL (ref 6–23)
CO2: 27 mEq/L (ref 19–32)
Calcium: 9.1 mg/dL (ref 8.4–10.5)
Chloride: 103 mEq/L (ref 96–112)
Creatinine, Ser: 2 mg/dL — ABNORMAL HIGH (ref 0.4–1.5)
GFR: 44.16 mL/min — ABNORMAL LOW (ref >60.00)
Glucose, Bld: 149 mg/dL — ABNORMAL HIGH (ref 70–99)
Potassium: 4.1 mEq/L (ref 3.5–5.1)
Sodium: 136 mEq/L (ref 135–145)

## 2012-04-03 LAB — CBC WITH DIFFERENTIAL/PLATELET
Basophils Absolute: 0 10*3/uL (ref 0.0–0.1)
Basophils Relative: 0.5 % (ref 0.0–3.0)
Eosinophils Absolute: 0.1 10*3/uL (ref 0.0–0.7)
Eosinophils Relative: 2.1 % (ref 0.0–5.0)
HCT: 30 % — ABNORMAL LOW (ref 39.0–52.0)
Hemoglobin: 10.1 g/dL — ABNORMAL LOW (ref 13.0–17.0)
Lymphocytes Relative: 15.9 % (ref 12.0–46.0)
Lymphs Abs: 0.9 10*3/uL (ref 0.7–4.0)
MCHC: 33.5 g/dL (ref 30.0–36.0)
MCV: 89.7 fl (ref 78.0–100.0)
Monocytes Absolute: 0.6 10*3/uL (ref 0.1–1.0)
Monocytes Relative: 9.9 % (ref 3.0–12.0)
Neutro Abs: 4.2 10*3/uL (ref 1.4–7.7)
Neutrophils Relative %: 71.6 % (ref 43.0–77.0)
Platelets: 217 10*3/uL (ref 150.0–400.0)
RBC: 3.34 Mil/uL — ABNORMAL LOW (ref 4.22–5.81)
RDW: 13.1 % (ref 11.5–14.6)
WBC: 5.9 10*3/uL (ref 4.5–10.5)

## 2012-04-03 NOTE — Patient Instructions (Addendum)
I think you are doing well  We need to check labs today (CBC/BMET)  Stay on your current medicines  We will see you in a 3 weeks - on a day that Dr. [place] is here  [facility] to start walking 5 to 10 minutes a day - then add a minute a day with a goal of getting to 45 to 60 minutes per day  Ok to drive  Call the [facility] office at [phone] if you have any questions, problems or concerns.

## 2012-04-03 NOTE — Addendum Note (Signed)
Addended by: Rosalio Macadamia on: 04/03/2012 09:29 AM   Modules accepted: Orders

## 2012-04-03 NOTE — Progress Notes (Addendum)
[name] [name] Date of Birth: Feb 27, 1951 Medical Record #[id number]  History of Present Illness: [name] [name] is seen today for a post hospital visit. He is seen for [name] [name]. He has multiple medical issues which include recent NSTEMI with cath showing multivessel CAD with an occluded RCA. Myoview showed inferior and inferoseptal infarct with minimal peri-infarct ischemia. EF per echo is 45 to 50%. Other problems include type 2 DM, HTN, HLD, obesity, CKD stage 3, brief spell of PAF - no anticoagulation, and bradycardia. He did have mobitz type 1 & 2 AV block which has excluded use of beta blocker. His coronary disease will be managed medically. He has been seen in the past by [name] [name] and was on ACE in the past (lisinopril 40 mg). Previously on HCTZ as well for his HTN.   He comes in today. He is here alone. He is doing ok. Has been home less than a week. No more chest tightness. Not short of breath. Not lightheaded or dizzy. No passing out spells. No problems with his cath site. Tolerating his medicines. Not on ACE or BB due to CKD and AV block.   Current Outpatient Prescriptions on File Prior to Visit  Medication Sig Dispense Refill  . amLODipine (NORVASC) 5 MG tablet Take 1 tablet (5 mg total) by mouth daily.  30 tablet  6  . aspirin EC 81 MG tablet Take 1 tablet (81 mg total) by mouth daily.      Marland Kitchen atorvastatin (LIPITOR) 80 MG tablet Take 1 tablet (80 mg total) by mouth daily at 6 PM.  30 tablet  6  . clopidogrel (PLAVIX) 75 MG tablet Take 1 tablet (75 mg total) by mouth daily.  30 tablet  6  . glipiZIDE (GLUCOTROL XL) 10 MG 24 hr tablet Take 10 mg by mouth daily.      . isosorbide mononitrate (IMDUR) 60 MG 24 hr tablet Take 1 tablet (60 mg total) by mouth daily.  30 tablet  6  . Multiple Vitamin (MULTIVITAMIN WITH MINERALS) TABS Take 1 tablet by mouth daily.      . sitaGLIPtin (JANUVIA) 50 MG tablet Take 50 mg by mouth daily.        No Known Allergies  Past Medical History  Diagnosis  Date  . Diabetes mellitus   . Obese   . Hypertension   . Coronary artery disease     s/p NSTEMI with multivessel CAD wit hoccluded RCA; myoview with inferior and inferoseptal infarct with minimal peri infarct ishemia - managed medically  . Myocardial infarction 03/2012  . Dysrhythmia 03/2012    bradycardia  . Heart murmur   . CKD (chronic kidney disease) stage 3, GFR 30-59 ml/min     CKD  . Neuromuscular disorder     TREMORS  . Arthritis   . Diabetes mellitus type 2 in obese   . PAF (paroxysmal atrial fibrillation) Jan 2014    Brief episode - no anticoagulation  . LV dysfunction Jan 2014    EF is 45 to 50% per echo    Past Surgical History  Procedure Date  . S/p knee surgery for torn ligament   . Rib fracture surgery   . Cardiac catheterization 03/21/2012    History  Smoking status  . Never Smoker   Smokeless tobacco  . Never Used    History  Alcohol Use No    History reviewed. No pertinent family history.  Review of Systems: The review of systems is per the HPI.  All other systems were reviewed and are negative.  Physical Exam: BP 130/76  Pulse 64  Ht 6\' 2"  (1.88 m)  Wt 291 lb 6.4 oz (132.178 kg)  BMI 37.41 kg/m2 Patient is very pleasant and in no acute distress. He is obese. Skin is warm and dry. Color is normal.  HEENT is unremarkable. Normocephalic/atraumatic. PERRL. Sclera are nonicteric. Neck is supple. No masses. No JVD. Lungs are clear. Cardiac exam shows a regular rate and rhythm. Abdomen is soft. Extremities are without edema. Gait and ROM are intact. No gross neurologic deficits noted.  LABORATORY DATA: Pending for today.   EKG today shows sinus rhythm. Rate is 62. Inferior T wave changes noted. No AV block  Lab Results  Component Value Date   WBC 5.6 03/28/2012   HGB 9.7* 03/28/2012   HCT 28.3* 03/28/2012   PLT 198 03/28/2012   GLUCOSE 172* 03/26/2012   CHOL 151 03/21/2012   TRIG 51 03/21/2012   HDL 36* 03/21/2012   LDLCALC 105* 03/21/2012   NA  134* 03/26/2012   K 4.3 03/26/2012   CL 99 03/26/2012   CREATININE 2.20* 03/26/2012   BUN 30* 03/26/2012   CO2 24 03/26/2012   INR 1.13 03/21/2012   HGBA1C 8.3* 03/21/2012   Coronary angiography:   Coronary dominance: right   Left mainstem: 30% distal left main.  Left anterior descending (LAD): Mild wall irregularities. There is a large diagonal with a 90-95% ostial lesion.  Left circumflex (LCx): The left circumflex gives rise to a single large marginal branch. There is a 70-80% ostial LCX lesion.  Right coronary artery (RCA): The RCA is occluded proximally with evidence of recent thrombus. There are left to right collaterals to the distal RCA.  Left ventriculography: Not performed.   Final Conclusions:  1. 3 vessel obstructive CAD. Recent RCA occlusion.  Recommendations: Would maximize medical therapy. Consider stress myoview on medical therapy to assess ischemic burden and symptoms. Ostial location of diagonal and LCX disease is not optimal for PCI. If significant symptoms or ischemia on medical therapy may need to consider CABG.   [name] [name]  03/21/2012, 4:49 PM   Echo Study Conclusions  - Left ventricle: The cavity size was normal. Wall thickness was increased in a pattern of mild LVH. Systolic function was mildly reduced. The estimated ejection fraction was in the range of 45% to 50%. There is akinesis of the inferior myocardium. Doppler parameters are consistent with abnormal left ventricular relaxation (grade 1 diastolic dysfunction). - Mitral valve: Mild regurgitation.  MYOVIEW IMPRESSION: 1. Mild LV systolic dysfunction, EF 42% with inferior and inferoseptal hypokinesis.  2. Fixed medium-sized, severe basal to mid inferior and inferoseptal perfusion defect. This suggests prior infarction with minimal ischemia.  3. Intermediate risk study.   Assessment / Plan: 1. NSTEMI - post cath - managed medically. On Plavix and Imdur. Could consider CABG if fails on  medical therapy. Overall, he is doing well and is asymptomatic. He may resume driving his personal vehicle. He works as a Naval architect. I have not released him yet to return to work. Will consider in 3 weeks. Check EKG today. I have also referred him to cardiac rehab today as well.  2. Anemia - recheck labs today  3. HTN - blood pressure is ok  4. Mild LV dysfunction - not on ACE  5. CKD - off of ACE but reports he was on Lisinopril prior to this recent hospitalization. No longer sees nephrology. Will recheck BMET today.  6. HLD - on statin  Patient is agreeable to this plan and will call if any problems develop in the interim.   Addendum: Actually would consider PCI of diagonal and/or RCA if he is symptomatic. Since myoview low risk will manage medically for now. [name] Arista ----- Message ----- From: Rosalio Macadamia, NP Sent: 04/03/2012 8:35 AM To: Peter M [place], MD

## 2012-04-25 ENCOUNTER — Encounter: Payer: Self-pay | Admitting: Nurse Practitioner

## 2012-04-25 ENCOUNTER — Ambulatory Visit (INDEPENDENT_AMBULATORY_CARE_PROVIDER_SITE_OTHER): Payer: Self-pay | Admitting: Nurse Practitioner

## 2012-04-25 VITALS — BP 160/78 | HR 66 | Resp 24 | Ht 73.0 in | Wt 288.8 lb

## 2012-04-25 DIAGNOSIS — I214 Non-ST elevation (NSTEMI) myocardial infarction: Secondary | ICD-10-CM

## 2012-04-25 LAB — LIPID PANEL
Cholesterol: 85 mg/dL (ref 0–200)
HDL: 28.8 mg/dL — ABNORMAL LOW (ref >39.00)
LDL Cholesterol: 47 mg/dL (ref 0–99)
Total CHOL/HDL Ratio: 3
Triglycerides: 47 mg/dL (ref 0.0–149.0)
VLDL: 9.4 mg/dL (ref 0.0–40.0)

## 2012-04-25 LAB — BASIC METABOLIC PANEL
BUN: 21 mg/dL (ref 6–23)
CO2: 27 mEq/L (ref 19–32)
Calcium: 9.4 mg/dL (ref 8.4–10.5)
Chloride: 103 mEq/L (ref 96–112)
Creatinine, Ser: 1.9 mg/dL — ABNORMAL HIGH (ref 0.4–1.5)
GFR: 46.87 mL/min — ABNORMAL LOW (ref >60.00)
Glucose, Bld: 123 mg/dL — ABNORMAL HIGH (ref 70–99)
Potassium: 4.2 mEq/L (ref 3.5–5.1)
Sodium: 137 mEq/L (ref 135–145)

## 2012-04-25 LAB — HEPATIC FUNCTION PANEL
ALT: 24 U/L (ref 0–53)
AST: 18 U/L (ref 0–37)
Albumin: 3.9 g/dL (ref 3.5–5.2)
Alkaline Phosphatase: 67 U/L (ref 39–117)
Bilirubin, Direct: 0 mg/dL (ref 0.0–0.3)
Total Bilirubin: 0.5 mg/dL (ref 0.3–1.2)
Total Protein: 7.6 g/dL (ref 6.0–8.3)

## 2012-04-25 LAB — CBC WITH DIFFERENTIAL/PLATELET
Basophils Absolute: 0 10*3/uL (ref 0.0–0.1)
Basophils Relative: 0.4 % (ref 0.0–3.0)
Eosinophils Absolute: 0.1 10*3/uL (ref 0.0–0.7)
Eosinophils Relative: 2.2 % (ref 0.0–5.0)
HCT: 33.9 % — ABNORMAL LOW (ref 39.0–52.0)
Hemoglobin: 11.1 g/dL — ABNORMAL LOW (ref 13.0–17.0)
Lymphocytes Relative: 19 % (ref 12.0–46.0)
Lymphs Abs: 1 10*3/uL (ref 0.7–4.0)
MCHC: 32.8 g/dL (ref 30.0–36.0)
MCV: 89.6 fl (ref 78.0–100.0)
Monocytes Absolute: 0.7 10*3/uL (ref 0.1–1.0)
Monocytes Relative: 12.5 % — ABNORMAL HIGH (ref 3.0–12.0)
Neutro Abs: 3.5 10*3/uL (ref 1.4–7.7)
Neutrophils Relative %: 65.9 % (ref 43.0–77.0)
Platelets: 181 10*3/uL (ref 150.0–400.0)
RBC: 3.78 Mil/uL — ABNORMAL LOW (ref 4.22–5.81)
RDW: 14.1 % (ref 11.5–14.6)
WBC: 5.3 10*3/uL (ref 4.5–10.5)

## 2012-04-25 MED ORDER — AMLODIPINE BESYLATE 5 MG PO TABS: 5.0000 mg | ORAL_TABLET | Freq: Every day | ORAL | Status: DC

## 2012-04-25 MED ORDER — HYDRALAZINE HCL 25 MG PO TABS: 25.0000 mg | ORAL_TABLET | Freq: Two times a day (BID) | ORAL | Status: DC

## 2012-04-25 MED ORDER — CLOPIDOGREL BISULFATE 75 MG PO TABS: 75.0000 mg | ORAL_TABLET | Freq: Every day | ORAL | Status: DC

## 2012-04-25 MED ORDER — ATORVASTATIN CALCIUM 80 MG PO TABS: 80.0000 mg | ORAL_TABLET | Freq: Every day | ORAL | Status: DC

## 2012-04-25 NOTE — Patient Instructions (Addendum)
Stay on your current medicines - these are refilled today  I am adding Hydralazine 25 mg two times a day for your blood pressure  Stay active and keep walking  We need to recheck labs today (BMET, LP, HPF and CBC)  See Dr. [place] in 6 weeks  Call the [facility] office at [phone] if you have any questions, problems or concerns.

## 2012-04-25 NOTE — Progress Notes (Addendum)
Future Thalman Date of Birth: 1950/09/04 Medical Record #[id number]  History of Present Illness: [name] is seen back today for a 3 week check. He is seen for Dr. [place]. He has multiple medical issues which include recent NSTEMI with cath showing multivessel CAD with an occluded RCA. Myoview showed inferior and inferoseptal infarct with minimal peri-infarct ischemia. EF per echo is 45 to 50%. Other problems include type 2 DM, HTN, HLD, obesity, CKD stage 3, brief spell of PAF - no anticoagulation, and bradycardia. He did have mobitz type 1 & 2 AV block which has excluded use of beta blocker. His coronary disease will be managed medically. PCI of the diagonal and/or RCA would be considered if he fails on medical therapy per Dr. [place]. He has been seen in the past by Dr. Casimiro Needle and was on ACE in the past (lisinopril 40 mg). Previously on HCTZ as well for his HTN. His EF is 45 to 50%.   I saw him for a post hospital visit at the first of this month. He was doing well. He remained off of beta blocker due to the AV block and off of his ACE due to CKD but has been on lisinopril in the past.   He comes back today. He is here alone. He is doing well. Not able to afford cardiac rehab. Apparently has no insurance (he was between jobs when he had his event). He is walking at home. Feels pretty good. Not able to check his BP at home. Taking his medicines. No chest pain. Not short of breath. Never had chest pain and actually presented with syncope. 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    Current Outpatient Prescriptions on File Prior to Visit  Medication Sig Dispense Refill  . amLODipine (NORVASC) 5 MG tablet Take 1 tablet (5 mg total) by mouth daily.  30 tablet  6  . aspirin EC 81 MG tablet Take 1 tablet (81 mg total) by mouth daily.      Marland Kitchen atorvastatin  (LIPITOR) 80 MG tablet Take 1 tablet (80 mg total) by mouth daily at 6 PM.  30 tablet  6  . clopidogrel (PLAVIX) 75 MG tablet Take 1 tablet (75 mg total) by mouth daily.  30 tablet  6  . glipiZIDE (GLUCOTROL XL) 10 MG 24 hr tablet Take 10 mg by mouth daily.      . isosorbide mononitrate (IMDUR) 60 MG 24 hr tablet Take 1 tablet (60 mg total) by mouth daily.  30 tablet  6  . Multiple Vitamin (MULTIVITAMIN WITH MINERALS) TABS Take 1 tablet by mouth daily.      . sitaGLIPtin (JANUVIA) 50 MG tablet Take 50 mg by mouth daily.       No current facility-administered medications on file prior to visit.    No Known Allergies  Past Medical History  Diagnosis Date  . Diabetes mellitus   . Obese   . Hypertension   . Coronary artery disease     s/p NSTEMI with multivessel CAD wit hoccluded RCA; myoview with inferior and inferoseptal infarct with minimal peri infarct ishemia - managed medically  . Myocardial infarction 03/2012  . Dysrhythmia 03/2012    bradycardia  . Heart murmur   . CKD (chronic kidney disease) stage 3, GFR 30-59 ml/min     CKD  .  Neuromuscular disorder     TREMORS  . Arthritis   . Diabetes mellitus type 2 in obese   . PAF (paroxysmal atrial fibrillation) Jan 2014    Brief episode - no anticoagulation  . LV dysfunction Jan 2014    EF is 45 to 50% per echo    Past Surgical History  Procedure Laterality Date  . S/p knee surgery for torn ligament    . Rib fracture surgery    . Cardiac catheterization  03/21/2012    History  Smoking status  . Never Smoker   Smokeless tobacco  . Never Used    History  Alcohol Use No    Family History  Problem Relation Age of Onset  . Heart disease Father     Review of Systems: The review of systems is per the HPI.  All other systems were reviewed and are negative.  Physical Exam: BP 160/78  Pulse 66  Resp 24  Ht 6\' 1"  (1.854 m)  Wt 288 lb 12 oz (130.976 kg)  BMI 38.1 kg/m2  SpO2 100% Patient is very pleasant and in no  acute distress. Skin is warm and dry. Color is normal.  HEENT is unremarkable except for missing teeth. Normocephalic/atraumatic. PERRL. Sclera are nonicteric. Neck is supple. No masses. No JVD. Lungs are clear. Cardiac exam shows a regular rate and rhythm. Abdomen is soft. Extremities are without edema. Gait and ROM are intact. No gross neurologic deficits noted.   LABORATORY DATA: Pending  Lab Results  Component Value Date   WBC 5.9 04/03/2012   HGB 10.1* 04/03/2012   HCT 30.0* 04/03/2012   PLT 217.0 04/03/2012   GLUCOSE 149* 04/03/2012   CHOL 151 03/21/2012   TRIG 51 03/21/2012   HDL 36* 03/21/2012   LDLCALC 105* 03/21/2012   NA 136 04/03/2012   K 4.1 04/03/2012   CL 103 04/03/2012   CREATININE 2.0* 04/03/2012   BUN 21 04/03/2012   CO2 27 04/03/2012   INR 1.13 03/21/2012   HGBA1C 8.3* 03/21/2012   Lab Results  Component Value Date   TROPONINI 5.79* 03/21/2012     Assessment / Plan:  1. NSTEMI - managed medically - clinically doing well. His primary symptom was syncope. 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   2. HTN - BP is up. I have added Hydralazine.   3. Mild LV dysfunction - Not on ACE due to his kidneys - Hydralazine is added today  4. CKD - no longer followed by nephrology - creatinine is 2  5. DM - followed at [facility]  6. HLD - switched over from Pravachol and now on Lipitor - needs repeat labs today  7. Anemia - ? From chronic disease (DM and renal disease) - rechecking today.  Overall, he is doing ok. We will see him back in 6 weeks. Labs are checked today. Encouraged him to keep up with his walking program.   Patient is agreeable to this plan and will call if any problems develop in the interim.    Addendum:  Patient left forms shortly after his visit regarding work. I was under the impression that he had chosen to not return to work. I talked with Dr. [place] and we were both in agreement that he could return to work as of 04/29/2012. Paper work  was filled out accordingly.

## 2012-05-08 ENCOUNTER — Telehealth: Payer: Self-pay | Admitting: *Deleted

## 2012-05-08 NOTE — Telephone Encounter (Signed)
Message copied by Debbe Bales on Mon May 08, 2012  7:51 AM ------      Message from: Bary Leriche      Created: Tue Apr 25, 2012  9:37 AM       Hi,  I'm sending Norma Fredrickson PA a insurance disability form to review on the above mentioned patient.  The patient's wife wants to pick it up when completed.  The latest office note must go with the form too.  The patient's wife is very anxious for the completion, she has called me about it a few times.            Thank you,  Elease Hashimoto @ [facility] ------

## 2012-05-08 NOTE — Telephone Encounter (Signed)
I forwarded this message to [facility]

## 2012-05-23 ENCOUNTER — Encounter: Payer: Self-pay | Admitting: Cardiology

## 2012-06-26 ENCOUNTER — Ambulatory Visit: Payer: Self-pay | Admitting: Cardiology

## 2012-08-30 ENCOUNTER — Encounter: Payer: Self-pay | Admitting: Cardiology

## 2012-08-30 ENCOUNTER — Ambulatory Visit (INDEPENDENT_AMBULATORY_CARE_PROVIDER_SITE_OTHER): Payer: Self-pay | Admitting: Cardiology

## 2012-08-30 VITALS — BP 152/82 | HR 69 | Ht 73.0 in | Wt 285.4 lb

## 2012-08-30 DIAGNOSIS — I2510 Atherosclerotic heart disease of native coronary artery without angina pectoris: Secondary | ICD-10-CM

## 2012-08-30 DIAGNOSIS — I441 Atrioventricular block, second degree: Secondary | ICD-10-CM

## 2012-08-30 DIAGNOSIS — I10 Essential (primary) hypertension: Secondary | ICD-10-CM

## 2012-08-30 DIAGNOSIS — E785 Hyperlipidemia, unspecified: Secondary | ICD-10-CM

## 2012-08-30 MED ORDER — HYDRALAZINE HCL 25 MG PO TABS: 25.0000 mg | ORAL_TABLET | Freq: Three times a day (TID) | ORAL | Status: DC

## 2012-08-30 NOTE — Progress Notes (Signed)
[name] [name] Date of Birth: 08/24/1950 Medical Record #[id number]  History of Present Illness: [name] [name] is seen back today for a followup visit. [name] [name] is s/p NSTEMI with cath in January 2014 showing multivessel CAD with an occluded RCA with collaterals and high-grade stenosis at the ostium of the first diagonal branch. There was also a moderate ostial left circumflex lesion of approximately 70%. Myoview showed inferior and inferoseptal infarct with minimal peri-infarct ischemia. EF per echo is 45 to 50%. [name] [name] has been treated medically. Other problems include type 2 DM, HTN, HLD, obesity, CKD stage 3, brief spell of PAF - no anticoagulation, and bradycardia. 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   [name] [name] has a history of chronic kidney disease and for this reason has been off of ACE inhibitors. On followup today [name] [name] is doing very well from a cardiac standpoint. [name] [name] denies any chest pain, shortness of breath, dizziness, or palpitations. 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 [name] [name] has lost 3 pounds. On [name] [name] last visit [name] [name] blood pressure was elevated and [name] [name] was started on hydralazine 25 mg twice a day. [name] [name] reports that [name] [name] father passed away recently with cancer.   Current Outpatient Prescriptions on File Prior to Visit  Medication Sig Dispense Refill  . amLODipine (NORVASC) 5 MG tablet Take 1 tablet (5 mg total) by mouth daily.  30 tablet  6  . aspirin EC 81 MG tablet Take 1 tablet (81 mg total) by mouth daily.      Marland Kitchen atorvastatin (LIPITOR) 80 MG tablet Take 1 tablet (80 mg total) by mouth daily at 6 PM.  30 tablet  6  . clopidogrel (PLAVIX) 75 MG tablet Take 1 tablet (75 mg total) by mouth daily.  30 tablet  6  . glipiZIDE (GLUCOTROL XL) 10 MG 24 hr tablet Take 10 mg by mouth daily.      . isosorbide mononitrate (IMDUR) 60 MG 24 hr tablet Take 1 tablet (60 mg total) by mouth daily.  30 tablet  6  . Multiple Vitamin (MULTIVITAMIN WITH MINERALS) TABS Take 1 tablet by  mouth daily.      . sitaGLIPtin (JANUVIA) 50 MG tablet Take 50 mg by mouth daily.       No current facility-administered medications on file prior to visit.    No Known Allergies  Past Medical History  Diagnosis Date  . Diabetes mellitus   . Obese   . Hypertension   . Coronary artery disease     s/p NSTEMI with multivessel CAD wit hoccluded RCA; myoview with inferior and inferoseptal infarct with minimal peri infarct ishemia - managed medically  . Myocardial infarction 03/2012  . Dysrhythmia 03/2012    bradycardia  . Heart murmur   . CKD (chronic kidney disease) stage 3, GFR 30-59 ml/min     CKD  . Neuromuscular disorder     TREMORS  . Arthritis   . Diabetes mellitus type 2 in obese   . PAF (paroxysmal atrial fibrillation) Jan 2014    Brief episode - no anticoagulation  . LV dysfunction Jan 2014    EF is 45 to 50% per echo    Past Surgical History  Procedure Laterality Date  . S/p knee surgery for torn ligament    . Rib fracture surgery    . Cardiac catheterization  03/21/2012    History  Smoking status  . Never Smoker   Smokeless tobacco  . Never Used  History  Alcohol Use No    Family History  Problem Relation Age of Onset  . Heart disease Father     Review of Systems: The review of systems is per the HPI.  All other systems were reviewed and are negative.  Physical Exam: BP 152/82  Pulse 69  Ht 6\' 1"  (1.854 m)  Wt 285 lb 6.4 oz (129.457 kg)  BMI 37.66 kg/m2  SpO2 99% Patient is very pleasant and in no acute distress. Skin is warm and dry. Color is normal.  HEENT is unremarkable except for missing teeth. Normocephalic/atraumatic. PERRL. Sclera are nonicteric. Neck is supple. No masses. No JVD. Lungs are clear. Cardiac exam shows a regular rate and rhythm. Abdomen is soft. Extremities are without edema. Gait and ROM are intact. No gross neurologic deficits noted.   LABORATORY DATA: Pending  Lab Results  Component Value Date   WBC 5.3 04/25/2012     HGB 11.1* 04/25/2012   HCT 33.9* 04/25/2012   PLT 181.0 04/25/2012   GLUCOSE 123* 04/25/2012   CHOL 85 04/25/2012   TRIG 47.0 04/25/2012   HDL 28.80* 04/25/2012   LDLCALC 47 04/25/2012   ALT 24 04/25/2012   AST 18 04/25/2012   NA 137 04/25/2012   K 4.2 04/25/2012   CL 103 04/25/2012   CREATININE 1.9* 04/25/2012   BUN 21 04/25/2012   CO2 27 04/25/2012   INR 1.13 03/21/2012   HGBA1C 8.3* 03/21/2012   Lab Results  Component Value Date   TROPONINI 5.79* 03/21/2012     Assessment / Plan:  1. NSTEMI/multivessel CAD. The RCA is well collateralized. [name] [name] had moderate ostial circumflex disease and severe ostial diagonal disease. - clinically doing well. No anginal symptoms. Myoview study was low risk showing inferior wall scar predominantly. Ejection fraction 45-50%. Continue medical therapy with aspirin, Plavix, amlodipine, and isosorbide.  2. HTN - BP is improved but still not optimal. We'll increase [name] [name] hydralazine to 25 mg 3 times a day.  3. Mild LV dysfunction - Not on ACE due to [name] [name] kidneys - on hydralazine and nitrates.  4. CKD STAGE III  5. DM - followed at [facility]  6. HLD - now on high-dose Lipitor. We will plan on checking fasting lab work when [name] [name] returns for [name] [name] next visit in 6 months.

## 2012-08-30 NOTE — Patient Instructions (Addendum)
Increase hydralazine to 25 mg three times a day.  Continue your other medications.  I will see you in 6 months with fasting lab work.

## 2013-08-21 ENCOUNTER — Ambulatory Visit: Payer: Self-pay | Admitting: Endocrinology

## 2013-08-29 ENCOUNTER — Ambulatory Visit: Payer: Self-pay | Admitting: Endocrinology

## 2013-08-29 DIAGNOSIS — Z0289 Encounter for other administrative examinations: Secondary | ICD-10-CM

## 2013-09-13 ENCOUNTER — Other Ambulatory Visit: Payer: Self-pay | Admitting: *Deleted

## 2013-09-13 ENCOUNTER — Encounter: Payer: Self-pay | Admitting: Endocrinology

## 2013-09-13 ENCOUNTER — Ambulatory Visit (INDEPENDENT_AMBULATORY_CARE_PROVIDER_SITE_OTHER): Payer: 59 | Admitting: Endocrinology

## 2013-09-13 VITALS — BP 148/67 | HR 69 | Temp 98.7°F | Resp 16 | Ht 74.0 in | Wt 254.4 lb

## 2013-09-13 DIAGNOSIS — E1129 Type 2 diabetes mellitus with other diabetic kidney complication: Secondary | ICD-10-CM

## 2013-09-13 DIAGNOSIS — E1165 Type 2 diabetes mellitus with hyperglycemia: Principal | ICD-10-CM

## 2013-09-13 DIAGNOSIS — I10 Essential (primary) hypertension: Secondary | ICD-10-CM

## 2013-09-13 DIAGNOSIS — N183 Chronic kidney disease, stage 3 unspecified: Secondary | ICD-10-CM

## 2013-09-13 MED ORDER — BAYER MICROLET LANCETS MISC: Status: AC

## 2013-09-13 MED ORDER — LIRAGLUTIDE 18 MG/3ML ~~LOC~~ SOPN: PEN_INJECTOR | SUBCUTANEOUS | Status: DC

## 2013-09-13 MED ORDER — GLUCOSE BLOOD VI STRP: ORAL_STRIP | Status: DC

## 2013-09-13 NOTE — Patient Instructions (Signed)
Start VICTOZA injection with the pen once daily at the same time of the day.  Dial the dose to 0.6 mg for the first week.  You may possibly experience nausea in the first few days which usually gets better in a couple of days After 1 week increase the dose to 1.2mg  daily if no nausea present.  You may inject in the stomach, thigh or arm.   You will feel fullness of the stomach with starting the medication and should try to keep portions of food small.  Call us or the [facility] helpline at [phone] or visit [email or web address] for any questions  Please check blood sugars at least half the time about 2 hours after any meal and 3 times a week on waking up. Please bring blood sugar monitor to each visit Continue regular exercise Have a protein at every meal, do not skip meals and eat on time Stop Januvia  If blood sugars are getting low, below 80 will need to reduce glipizide to 5 mg

## 2013-09-13 NOTE — Progress Notes (Signed)
Patient ID: [name] [name], male   DOB: 1950/05/04, 63 y.o.   MRN: [id number]    Reason for Appointment: Consultation for Type 2 Diabetes  Referring physician: Providence Lanius  History of Present Illness:           Diagnosis: Type 2 diabetes mellitus, date of diagnosis: 1996       Past history: Metformin was used initially to control his diabetes but this was stopped because of reportedly having constipation Subsequently he had been on other oral hypoglycemic drugs and Januvia for the last 2-3 years  Recent history:  Review of his outside records indicate that his A1c was 16.2% in January 2015 Not clear why his blood sugar was over 500 at that time. Clinical records are not available He does not think he was having excessive thirst or urination at that time Subsequently his blood sugars have been somewhat better but A1c was still high at 11.2% in 6/15 and he has been referred here for further management. Glucose was 366 in April and 221 in the lab in 6/15 He was told that he may need to be on insulin and because of this he has been really working hard on his diet with cutting back on fat intake and carbohydrates and having more salads and soups He also thinks that blood sugars may be higher since he was out of his Januvia for almost a month until about a month ago He is still using a generic monitor for checking his glucose and appears to be checking it rarely in the last month However blood sugars appear to be somewhat better since he increased his Januvia to 100 mg about 2-3 weeks ago from his PCP  Oral hypoglycemic drugs the patient is taking are: Glipizide ER 10mg , Januvia 100 mg daily      Side effects from medications have been:  ? Constipation from metformin  Glucose monitoring:  done one time a day         Glucometer: True result      Blood Glucose readings from meter only 3 readings in the last month, recently 145 fasting but last month glucose was 220 Hypoglycemia:      Glycemic control:   Last A1c 11.2 in 6/15   Lab Results  Component Value Date   HGBA1C 8.3* 03/21/2012   Lab Results  Component Value Date   LDLCALC 47 04/25/2012   CREATININE 1.9* 04/25/2012    Self-care: The diet that the patient has been following is: tries to limit bread, eating more salads.      Meals: 3 meals per day.          Exercise: Swims regularly           Dietician visit: Most recent:2013.               Compliance with the medical regimen:  Retinal exam: Most recent:4 yrs.    Weight history: Wt Readings from Last 3 Encounters:  09/13/13 254 lb 6.4 oz (115.395 kg)  08/30/12 285 lb 6.4 oz (129.457 kg)  04/25/12 288 lb 12 oz (130.976 kg)      Medication List       This list is accurate as of: 09/13/13  2:20 PM.  Always use your most recent med list.               amLODipine 5 MG tablet  Commonly known as:  NORVASC  Take 1 tablet (5 mg total) by mouth daily.     aspirin  EC 81 MG tablet  Take 1 tablet (81 mg total) by mouth daily.     atorvastatin 80 MG tablet  Commonly known as:  LIPITOR  Take 1 tablet (80 mg total) by mouth daily at 6 PM.     clopidogrel 75 MG tablet  Commonly known as:  PLAVIX  Take 1 tablet (75 mg total) by mouth daily.     glipiZIDE 10 MG 24 hr tablet  Commonly known as:  GLUCOTROL XL  Take 10 mg by mouth daily.     hydrALAZINE 25 MG tablet  Commonly known as:  APRESOLINE  Take 1 tablet (25 mg total) by mouth 3 (three) times daily.     isosorbide mononitrate 60 MG 24 hr tablet  Commonly known as:  IMDUR  Take 1 tablet (60 mg total) by mouth daily.     multivitamin with minerals Tabs tablet  Take 1 tablet by mouth daily.     sitaGLIPtin 50 MG tablet  Commonly known as:  JANUVIA  Take 50 mg by mouth daily.        Allergies:  Allergies  Allergen Reactions  . Metformin And Related     Renal insufficiency    Past Medical History  Diagnosis Date  . Diabetes mellitus   . Obese   . Hypertension   . Coronary artery disease     s/p  NSTEMI with multivessel CAD wit hoccluded RCA; myoview with inferior and inferoseptal infarct with minimal peri infarct ishemia - managed medically  . Myocardial infarction 03/2012  . Dysrhythmia 03/2012    bradycardia  . Heart murmur   . CKD (chronic kidney disease) stage 3, GFR 30-59 ml/min     CKD  . Neuromuscular disorder     TREMORS  . Arthritis   . Diabetes mellitus type 2 in obese   . PAF (paroxysmal atrial fibrillation) Jan 2014    Brief episode - no anticoagulation  . LV dysfunction Jan 2014    EF is 45 to 50% per echo    Past Surgical History  Procedure Laterality Date  . S/p knee surgery for torn ligament    . Rib fracture surgery    . Cardiac catheterization  03/21/2012    Family History  Problem Relation Age of Onset  . Heart disease Father     Social History:  reports that he has never smoked. He has never used smokeless tobacco. He reports that he does not drink alcohol or use illicit drugs.    Review of Systems       Lipids: On Rx since 1/14       Lab Results  Component Value Date   CHOL 85 04/25/2012   HDL 28.80* 04/25/2012   LDLCALC 47 04/25/2012   TRIG 47.0 04/25/2012   CHOLHDL 3 04/25/2012                  Skin: No rash or infections     Thyroid:  No  unusual fatigue.     The blood pressure has been treated with medications for several years     Chronic kidney disease: He has had mild increase in creatinine is about 2009, has seen a nephrologist but not clear of the etiology     No swelling of feet.     No shortness of breath on exertion.     Bowel habits: Normal. Lactose intolerant       No frequency of urination but has  nocturia 2 times  Is not complaining of erectile dysfunction      No joint  pains.          He may have transient Numbness but no tingling or burning in feet       Physical Examination:  BP 148/67  Pulse 69  Temp(Src) 98.7 F (37.1 C)  Resp 16  Ht 6\' 2"  (1.88 m)  Wt 254 lb 6.4 oz (115.395 kg)  BMI  32.65 kg/m2  SpO2 97%  GENERAL:         Patient has generalized obesity.   HEENT:         Eye exam shows normal external appearance. Fundus exam shows no retinopathy. Oral exam shows normal mucosa .  NECK:         General:  Neck exam shows no lymphadenopathy. Carotids are normal to palpation and no bruit heard. Thyroid is not enlarged and no nodules felt.   LUNGS:         Chest is symmetrical. Lungs are clear to auscultation.Marland Kitchen   HEART:         Heart sounds:  S1 and S2 are normal. No murmurs or clicks heard., no S3 or S4.   ABDOMEN:   There is no distention present. Liver and spleen are not palpable. No other mass or tenderness present.  EXTREMITIES:     There is no edema. No skin lesions present.Marland Kitchen  NEUROLOGICAL:   Vibration sense is moderately reduced in toes. Ankle jerks are absent bilaterally.          Diabetic foot exam:  as in the foot exam section MUSCULOSKELETAL:       There is no enlargement or deformity of the joints. Spine is normal to inspection.Marland Kitchen   SKIN:       No rash or lesions of concern.        ASSESSMENT:  Diabetes type 2, uncontrolled     His level of control has been much worse in the last year with A1c is higher 16 and recently 11% Although he may be somewhat insulin deficient he has done well with modifying his diet and his blood sugars appear to be improving recently.  Has checked only rare blood sugars with his monitor and not clear what his postprandial readings are Discussed with him that he will need a more effective drug than the Januvia for optimal control control and currently the dose is limited to 50 mg because of his renal dysfunction; glipizide will not be effective long-term He may be a little more insulin sensitive since  he has lost weight since 2014  Complications: No evident complications at this time  Hypertension: Blood pressure is high normal today, followed by PCP  History of coronary artery disease,? Cardiomyopathy and paroxysmal atrial  fibrillation  Chronic kidney disease: Not clear this is related to his diabetes or other factors, followed by nephrologist recently  PLAN:   Start using a brand-name glucose monitor which can be downloaded. He will start with the Contour meter and it was demonstrated to him today. He can check blood sugars once a day but alternate fasting and after meals readings at various times. Discussed with the patient the nature of GLP-1 drugs, the action on various organ systems, how they benefit blood glucose control, as well as the benefit of weight loss and  increase satiety . Explained possible side effects especially nausea and vomiting; discussed safety information in package insert. Described injection technique and dosage titration of Victoza  starting with 0.6  mg once a day at the same time for the first week and then increasing to 1.2 mg if no symptoms of nausea. Patient brochure on Victoza and co-pay card given Continue glipizide ER for now and consider reducing the dose if blood sugars are low normal  Stop the Januvia Continue regular exercise program and low fat, low carbohydrate diet  Consider followup consultation with dietitian He will followup in 4 weeks and will check fructosamine to evaluate overall control at that time  Total visit time including counseling and coordination of care, review of records is 60 minutes  Alexsus Papadopoulos 09/13/2013, 2:20 PM   Note: This office note was prepared with Dragon voice recognition system technology. Any transcriptional errors that result from this process are unintentional.

## 2013-09-17 ENCOUNTER — Telehealth: Payer: Self-pay | Admitting: Endocrinology

## 2013-09-17 NOTE — Telephone Encounter (Signed)
Patient states his insurance will not cover his rx   Please advise patient   [facility] [place] [facility]   Thank You    They will not pay strips for bayer only one touch

## 2013-09-19 NOTE — Telephone Encounter (Signed)
His insurance won't cover Victoza, he's going to call them to see what they will cover. New script sent for one touch supplies, Bayer is not covered.

## 2013-10-12 ENCOUNTER — Ambulatory Visit (INDEPENDENT_AMBULATORY_CARE_PROVIDER_SITE_OTHER): Payer: 59 | Admitting: Endocrinology

## 2013-10-12 ENCOUNTER — Encounter: Payer: Self-pay | Admitting: Endocrinology

## 2013-10-12 VITALS — BP 158/80 | HR 67 | Temp 98.0°F | Resp 16 | Ht 74.0 in | Wt 254.0 lb

## 2013-10-12 DIAGNOSIS — N183 Chronic kidney disease, stage 3 unspecified: Secondary | ICD-10-CM

## 2013-10-12 DIAGNOSIS — I10 Essential (primary) hypertension: Secondary | ICD-10-CM

## 2013-10-12 DIAGNOSIS — E1165 Type 2 diabetes mellitus with hyperglycemia: Principal | ICD-10-CM

## 2013-10-12 DIAGNOSIS — E1129 Type 2 diabetes mellitus with other diabetic kidney complication: Secondary | ICD-10-CM

## 2013-10-12 LAB — BASIC METABOLIC PANEL
BUN: 26 mg/dL — AB (ref 6–23)
CHLORIDE: 103 meq/L (ref 96–112)
CO2: 26 meq/L (ref 19–32)
CREATININE: 1.9 mg/dL — AB (ref 0.4–1.5)
Calcium: 9.6 mg/dL (ref 8.4–10.5)
GFR: 46.94 mL/min — ABNORMAL LOW (ref >60.00)
Glucose, Bld: 94 mg/dL (ref 70–99)
Potassium: 4.1 mEq/L (ref 3.5–5.1)
Sodium: 135 mEq/L (ref 135–145)

## 2013-10-12 NOTE — Progress Notes (Signed)
Patient ID: [name] [name], male   DOB: [birth year], 63 y.o.   MRN: [id number]    Reason for Appointment: Consultation for Type 2 Diabetes  Referring physician: Providence Lanius  History of Present Illness:           Diagnosis: Type 2 diabetes mellitus, date of diagnosis: 1996       Past history: Metformin was used initially to control his diabetes but this was stopped because of reportedly having constipation Subsequently he had been on other oral hypoglycemic drugs and Januvia for the last 2-3 years Review of his outside records indicate that his A1c was 16.2% in January 2015  Recent history:  His A1c was still high at 11.2% in 6/15 and he was referred here for further management. Blood sugars by lab work indicated readings of at least 200 prior to this Because of poor control with Januvia and glipizide he was instructed on starting Victoza in 7/50 However he could not afford the prescription and did not start this He has however tried to change his diet with cutting back on fat intake and carbohydrates and having more salads and soups Also is more active at his new part-time job He is on 50 mg Januvia because of renal dysfunction and is not taking this regularly, has no difficulty with insurance coverage on this More recently his blood sugars are reportedly improved although he did not bring the record of his readings Still has not started using the brand name monitor Contour that was given to him  Oral hypoglycemic drugs the patient is taking are: Glipizide ER 10mg , Januvia      Side effects from medications have been:  ? Constipation from metformin  Glucose monitoring:  done one time a day         Glucometer: True result      Blood Glucose readings by recall recently 103-175 range  Hypoglycemia: None      Glycemic control:  Last A1c 11.2 in 6/15 Last creatinine 1.87  Lab Results  Component Value Date   HGBA1C 8.3* 03/21/2012   Lab Results  Component Value Date   LDLCALC 47 04/25/2012    CREATININE 1.9* 04/25/2012    Self-care: The diet that the patient has been following is: tries to limit bread, eating more salads and vegetables.      Meals: 3 meals per day.          Exercise: Swims regularly, active at work recently           [employer] visit: Most recent:2013.               Compliance with the medical regimen: Improved Retinal exam: Most recent:4 yrs.    Weight history: Wt Readings from Last 3 Encounters:  10/12/13 254 lb (115.214 kg)  09/13/13 254 lb 6.4 oz (115.395 kg)  08/30/12 285 lb 6.4 oz (129.457 kg)      Medication List       This list is accurate as of: 10/12/13  3:29 PM.  Always use your most recent med list.               amLODipine 5 MG tablet  Commonly known as:  NORVASC  Take 1 tablet (5 mg total) by mouth daily.     aspirin EC 81 MG tablet  Take 1 tablet (81 mg total) by mouth daily.     atorvastatin 80 MG tablet  Commonly known as:  LIPITOR  Take 1 tablet (80 mg total) by mouth daily  at 6 PM.     BAYER MICROLET LANCETS lancets  Use as instructed to check blood sugar once daily dx code 250.42     clopidogrel 75 MG tablet  Commonly known as:  PLAVIX  Take 1 tablet (75 mg total) by mouth daily.     glipiZIDE 10 MG 24 hr tablet  Commonly known as:  GLUCOTROL XL  Take 10 mg by mouth daily.     glucose blood test strip  Commonly known as:  BAYER CONTOUR NEXT TEST  Use as instructed to check blood sugar once daily at different times. Dx code 250.42     hydrALAZINE 25 MG tablet  Commonly known as:  APRESOLINE  Take 1 tablet (25 mg total) by mouth 3 (three) times daily.     isosorbide mononitrate 60 MG 24 hr tablet  Commonly known as:  IMDUR  Take 1 tablet (60 mg total) by mouth daily.     multivitamin with minerals Tabs tablet  Take 1 tablet by mouth daily.     sitaGLIPtin 50 MG tablet  Commonly known as:  JANUVIA  Take 50 mg by mouth daily.        Allergies:  Allergies  Allergen Reactions  . Metformin And Related      Renal insufficiency    Past Medical History  Diagnosis Date  . Diabetes mellitus   . Obese   . Hypertension   . Coronary artery disease     s/p NSTEMI with multivessel CAD wit hoccluded RCA; myoview with inferior and inferoseptal infarct with minimal peri infarct ishemia - managed medically  . Myocardial infarction 03/2012  . Dysrhythmia 03/2012    bradycardia  . Heart murmur   . CKD (chronic kidney disease) stage 3, GFR 30-59 ml/min     CKD  . Neuromuscular disorder     TREMORS  . Arthritis   . Diabetes mellitus type 2 in obese   . PAF (paroxysmal atrial fibrillation) Jan 2014    Brief episode - no anticoagulation  . LV dysfunction Jan 2014    EF is 45 to 50% per echo    Past Surgical History  Procedure Laterality Date  . S/p knee surgery for torn ligament    . Rib fracture surgery    . Cardiac catheterization  03/21/2012    Family History  Problem Relation Age of Onset  . Heart disease Father     Social History:  reports that he has never smoked. He has never used smokeless tobacco. He reports that he does not drink alcohol or use illicit drugs.    Review of Systems       Lipids: On treatment with Lipitor 80 mg  since 1/14, no recent levels available       Lab Results  Component Value Date   CHOL 85 04/25/2012   HDL 28.80* 04/25/2012   LDLCALC 47 04/25/2012   TRIG 47.0 04/25/2012   CHOLHDL 3 04/25/2012   History of coronary artery disease  Has history of chronic kidney disease of unknown etiology              Physical Examination:  BP 175/91  Pulse 67  Temp(Src) 98 F (36.7 C)  Resp 16  Ht 6\' 2"  (1.88 m)  Wt 254 lb (115.214 kg)  BMI 32.60 kg/m2  SpO2 98%       ASSESSMENT/PLAN:   Diabetes type 2, uncontrolled      Although his  level of control has been much worse in  the last year with A1c recently 11% His blood sugars appear to be improving now mostly with his trying to significantly improve his diet and increase his activity level Difficult  to assess his recent control as he did not bring his monitor and checking mostly in the morning Also has not changed any medications  Ideally he should be taking Victoza but he cannot afford this We will need to check his fructosamine today to reassess his short-term control For now will continue same medications including Januvia and Glucotrol Reminded him to check more readings after meals and start using the [employer] meter   [facility] 10/12/2013, 3:29 PM   Note: This office note was prepared with Insurance underwriter. Any transcriptional errors that result from this process are unintentional.

## 2013-10-15 LAB — FRUCTOSAMINE: Fructosamine: 340 umol/L — ABNORMAL HIGH (ref 190–270)

## 2013-11-01 ENCOUNTER — Telehealth: Payer: Self-pay | Admitting: Endocrinology

## 2013-11-01 NOTE — Telephone Encounter (Signed)
Patient is asking if the form for his meds Januvia 50 mg has been sent out yet Please advise.

## 2013-11-15 NOTE — Telephone Encounter (Signed)
Heather from [facility] would like the last record of patient A1C., Phone # [phone]  Fax # [phone]

## 2013-11-20 ENCOUNTER — Other Ambulatory Visit: Payer: 59

## 2013-11-23 ENCOUNTER — Telehealth: Payer: Self-pay | Admitting: Endocrinology

## 2013-11-23 ENCOUNTER — Ambulatory Visit: Payer: 59 | Admitting: Endocrinology

## 2013-11-23 DIAGNOSIS — Z0289 Encounter for other administrative examinations: Secondary | ICD-10-CM

## 2013-11-23 NOTE — Telephone Encounter (Signed)
Patient no showed today's appt. Please advise on how to follow up. °A. No follow up necessary. °B. Follow up urgent. Contact patient immediately. °C. Follow up necessary. Contact patient and schedule visit in ___ days. °D. Follow up advised. Contact patient and schedule visit in ____weeks. ° °

## 2013-11-25 NOTE — Telephone Encounter (Signed)
    [name], [name] - 11/23/2013 5:07 PM More Detail >>      [name]      Sent: Caleen Essex November 23, 2013  5:08 PM      To: Reather Littler, MD      Cc: Valora Piccolo, CMA                 [facility] Size     Small     Medium     Large     Extra Extra Large              [name] [name]  11/23/2013   Telephone  MRN:  [id number]  .  Follow up necessary. Contact patient and schedule visit with first available appointment but needs labs also. If patient refuses to come back he will need to be dismissed           Contacts        Type Contact Phone      11/23/2013  5:07 PM Phone (Outgoing) [name], [name] (Self) [phone] (H)

## 2013-12-19 ENCOUNTER — Other Ambulatory Visit (INDEPENDENT_AMBULATORY_CARE_PROVIDER_SITE_OTHER): Payer: 59

## 2013-12-19 DIAGNOSIS — IMO0002 Reserved for concepts with insufficient information to code with codable children: Secondary | ICD-10-CM

## 2013-12-19 DIAGNOSIS — E1129 Type 2 diabetes mellitus with other diabetic kidney complication: Secondary | ICD-10-CM

## 2013-12-19 DIAGNOSIS — E1165 Type 2 diabetes mellitus with hyperglycemia: Secondary | ICD-10-CM

## 2013-12-19 LAB — LIPID PANEL
CHOLESTEROL: 111 mg/dL (ref 0–200)
HDL: 35.8 mg/dL — AB (ref >39.00)
LDL Cholesterol: 63 mg/dL (ref 0–99)
NonHDL: 75.2
TRIGLYCERIDES: 62 mg/dL (ref 0.0–149.0)
Total CHOL/HDL Ratio: 3
VLDL: 12.4 mg/dL (ref 0.0–40.0)

## 2013-12-19 LAB — COMPREHENSIVE METABOLIC PANEL
ALT: 30 U/L (ref 0–53)
AST: 27 U/L (ref 0–37)
Albumin: 3.4 g/dL — ABNORMAL LOW (ref 3.5–5.2)
Alkaline Phosphatase: 57 U/L (ref 39–117)
BILIRUBIN TOTAL: 0.5 mg/dL (ref 0.2–1.2)
BUN: 29 mg/dL — AB (ref 6–23)
CO2: 27 meq/L (ref 19–32)
CREATININE: 1.9 mg/dL — AB (ref 0.4–1.5)
Calcium: 10 mg/dL (ref 8.4–10.5)
Chloride: 103 mEq/L (ref 96–112)
GFR: 47.5 mL/min — AB (ref >60.00)
GLUCOSE: 123 mg/dL — AB (ref 70–99)
Potassium: 4.7 mEq/L (ref 3.5–5.1)
Sodium: 138 mEq/L (ref 135–145)
TOTAL PROTEIN: 7.4 g/dL (ref 6.0–8.3)

## 2013-12-19 LAB — HEMOGLOBIN A1C: HEMOGLOBIN A1C: 8.2 % — AB (ref 4.6–6.5)

## 2013-12-26 ENCOUNTER — Other Ambulatory Visit: Payer: Self-pay | Admitting: *Deleted

## 2013-12-26 ENCOUNTER — Encounter: Payer: Self-pay | Admitting: Endocrinology

## 2013-12-26 ENCOUNTER — Ambulatory Visit (INDEPENDENT_AMBULATORY_CARE_PROVIDER_SITE_OTHER): Payer: 59 | Admitting: Endocrinology

## 2013-12-26 VITALS — BP 154/75 | HR 66 | Temp 98.3°F | Resp 16 | Ht 74.0 in | Wt 245.4 lb

## 2013-12-26 DIAGNOSIS — IMO0002 Reserved for concepts with insufficient information to code with codable children: Secondary | ICD-10-CM

## 2013-12-26 DIAGNOSIS — I10 Essential (primary) hypertension: Secondary | ICD-10-CM

## 2013-12-26 DIAGNOSIS — E1165 Type 2 diabetes mellitus with hyperglycemia: Secondary | ICD-10-CM

## 2013-12-26 DIAGNOSIS — E785 Hyperlipidemia, unspecified: Secondary | ICD-10-CM

## 2013-12-26 DIAGNOSIS — Z23 Encounter for immunization: Secondary | ICD-10-CM

## 2013-12-26 MED ORDER — GLUCOSE BLOOD VI STRP: ORAL_STRIP | Status: DC

## 2013-12-26 MED ORDER — ALBIGLUTIDE 30 MG ~~LOC~~ PEN: PEN_INJECTOR | SUBCUTANEOUS | Status: DC

## 2013-12-26 NOTE — Patient Instructions (Addendum)
Please check blood sugars at least half the time about 2 hours after any meal and 2-3  times per week on waking up. Please bring blood sugar monitor to each visit  Tanzeum 30 mg

## 2013-12-26 NOTE — Progress Notes (Signed)
Patient ID: [name] [name], male   DOB: 03/22/1950, 63 y.o.   MRN: [id number]    Reason for Appointment: f/u for Type 2 Diabetes  Referring physician: Providence LaniusHowell  History of Present Illness:           Diagnosis: Type 2 diabetes mellitus, date of diagnosis: 1996       Past history: Metformin was used initially to control his diabetes but this was stopped because of reportedly having constipation Subsequently he had been on other oral hypoglycemic drugs and Januvia for the last 2-3 years Review of his outside records indicate that his A1c was 16.2% in January 2015  Recent history:  His A1c was still high at 11.2% in 6/15 and he was referred here for further management.  Because of poor control with Januvia and glipizide he was instructed on starting Victoza in 08/2013 However he could not afford the prescription and did not start this He is on 50 mg Januvia because of renal dysfunction and is not taking this regularly, has no difficulty with insurance coverage on this  He has however been able to change his diet with cutting back on fat intake and carbohydrates and having more salads and soups Also is more active at his part-time job More recently has lost weight However his A1c is still over 8% He has been checking his blood sugar somewhat sporadically and mostly right after eating breakfast He has been previously instructed on when to check his blood sugars; had no readings after dinner Fasting glucose was relatively good in the lab  Oral hypoglycemic drugs the patient is taking are: Glipizide ER 10mg , Januvia 50 mg      Side effects from medications have been:  ? Constipation from metformin  Glucose monitoring:  done one time a day or less        Glucometer: Currently Contour      Blood Glucose readings by download:  POST-MEAL PC Breakfast PC Lunch PC Dinner  Glucose range:  144-162  175    Mean/median:       Hypoglycemia: None      Glycemic control:  Last A1c 11.2 in 6/15 Last  creatinine 1.87  Lab Results  Component Value Date   HGBA1C 8.2* 12/19/2013   HGBA1C 8.3* 03/21/2012   Lab Results  Component Value Date   LDLCALC 63 12/19/2013   CREATININE 1.9* 12/19/2013    Self-care: The diet that the patient has been following is: tries to limit bread, eating more salads and vegetables.      Meals: 3 meals per day.         Exercise: Swims regularly, active at work recently           Big LotsDietician visit: Most recent:2013.               Compliance with the medical regimen: Improved Retinal exam: Most recent:4 yrs.    Weight history: Wt Readings from Last 3 Encounters:  12/26/13 245 lb 6.4 oz (111.313 kg)  10/12/13 254 lb (115.214 kg)  09/13/13 254 lb 6.4 oz (115.395 kg)      Medication List       This list is accurate as of: 12/26/13 10:48 AM.  Always use your most recent med list.               amLODipine 5 MG tablet  Commonly known as:  NORVASC  Take 1 tablet (5 mg total) by mouth daily.     aspirin EC 81 MG tablet  Take 1 tablet (81 mg total) by mouth daily.     atorvastatin 80 MG tablet  Commonly known as:  LIPITOR  Take 1 tablet (80 mg total) by mouth daily at 6 PM.     BAYER MICROLET LANCETS lancets  Use as instructed to check blood sugar once daily dx code 250.42     clopidogrel 75 MG tablet  Commonly known as:  PLAVIX  Take 1 tablet (75 mg total) by mouth daily.     glipiZIDE 10 MG 24 hr tablet  Commonly known as:  GLUCOTROL XL  Take 10 mg by mouth daily.     glucose blood test strip  Commonly known as:  ONETOUCH VERIO  Use as instructed to check blood sugar once a day dx code E11.29     hydrALAZINE 25 MG tablet  Commonly known as:  APRESOLINE  Take 1 tablet (25 mg total) by mouth 3 (three) times daily.     isosorbide mononitrate 60 MG 24 hr tablet  Commonly known as:  IMDUR  Take 1 tablet (60 mg total) by mouth daily.     multivitamin with minerals Tabs tablet  Take 1 tablet by mouth daily.     sitaGLIPtin 50 MG tablet   Commonly known as:  JANUVIA  Take 50 mg by mouth daily.        Allergies:  Allergies  Allergen Reactions  . Metformin And Related     Renal insufficiency    Past Medical History  Diagnosis Date  . Diabetes mellitus   . Obese   . Hypertension   . Coronary artery disease     s/p NSTEMI with multivessel CAD wit hoccluded RCA; myoview with inferior and inferoseptal infarct with minimal peri infarct ishemia - managed medically  . Myocardial infarction 03/2012  . Dysrhythmia 03/2012    bradycardia  . Heart murmur   . CKD (chronic kidney disease) stage 3, GFR 30-59 ml/min     CKD  . Neuromuscular disorder     TREMORS  . Arthritis   . Diabetes mellitus type 2 in obese   . PAF (paroxysmal atrial fibrillation) Jan 2014    Brief episode - no anticoagulation  . LV dysfunction Jan 2014    EF is 45 to 50% per echo    Past Surgical History  Procedure Laterality Date  . S/p knee surgery for torn ligament    . Rib fracture surgery    . Cardiac catheterization  03/21/2012    Family History  Problem Relation Age of Onset  . Heart disease Father     Social History:  reports that he has never smoked. He has never used smokeless tobacco. He reports that he does not drink alcohol or use illicit drugs.    Review of Systems       Lipids: On treatment with Lipitor 80 mg  since 1/14 with good control       Lab Results  Component Value Date   CHOL 111 12/19/2013   HDL 35.80* 12/19/2013   LDLCALC 63 12/19/2013   TRIG 62.0 12/19/2013   CHOLHDL 3 12/19/2013   History of coronary artery disease  Has history of chronic kidney disease of unknown etiology  Lab Results  Component Value Date   CREATININE 1.9* 12/19/2013                 Physical Examination:  BP 154/75  Pulse 66  Temp(Src) 98.3 F (36.8 C)  Resp 16  Ht 6\' 2"  (1.88  m)  Wt 245 lb 6.4 oz (111.313 kg)  BMI 31.49 kg/m2  SpO2 99%       no ankle edema  ASSESSMENT/PLAN:   Diabetes type 2, uncontrolled      His blood sugars appear to be still inadequately controlled with A1c over 8% This is despite his losing weight, about 10 pounds recently from staying very active and also usually watching his diet Since his fasting glucose was fairly good in the lab at 123 he is likely having high postprandial readings which he has not checked Discussed benefits of GLP-1 drugs again compared to Januvia and he should be able to take either with Victoza or Tanzeum despite his renal dysfunction Discussed timing of glucose monitoring and frequency of monitoring as well as glucose targets Difficult to assess his recent control as he did not bring his monitor and checking mostly in the morning  He was given information on GLP-1 drugs and Tanzeum He was instructed on the Tanzeum pen by the nurse educator He was stopped to Januvia a week after starting the injection Also if he is not able to get Tanzeum on his insurance may need to do Victoza daily Discussed possible need for reduction of glipizide dose if his blood sugars are near 100 and he will call, discussed potential for hypoglycemia and management of this He was also given prescription for test strips for his One Touch Verio monitor Followup in one month  HYPERLIPIDEMIA: Well controlled  Counseling time over 50% of today's 25 minute visit    [name] [name] 12/26/2013, 10:48 AM   Note: This office note was prepared with Insurance underwriterDragon voice recognition system technology. Any transcriptional errors that result from this process are unintentional.  No visits with results within 1 Week(s) from this visit. Latest known visit with results is:  Appointment on 12/19/2013  Component Date Value Ref Range Status  . Sodium 12/19/2013 138  135 - 145 mEq/L Final  . Potassium 12/19/2013 4.7  3.5 - 5.1 mEq/L Final  . Chloride 12/19/2013 103  96 - 112 mEq/L Final  . CO2 12/19/2013 27  19 - 32 mEq/L Final  . Glucose, Bld 12/19/2013 123* 70 - 99 mg/dL Final  . BUN 16/10/960410/21/2015  29* 6 - 23 mg/dL Final  . Creatinine, Ser 12/19/2013 1.9* 0.4 - 1.5 mg/dL Final  . Total Bilirubin 12/19/2013 0.5  0.2 - 1.2 mg/dL Final  . Alkaline Phosphatase 12/19/2013 57  39 - 117 U/L Final  . AST 12/19/2013 27  0 - 37 U/L Final  . ALT 12/19/2013 30  0 - 53 U/L Final  . Total Protein 12/19/2013 7.4  6.0 - 8.3 g/dL Final  . Albumin 54/09/811910/21/2015 3.4* 3.5 - 5.2 g/dL Final  . Calcium 14/78/295610/21/2015 10.0  8.4 - 10.5 mg/dL Final  . GFR 21/30/865710/21/2015 47.50* >60.00 mL/min Final  . Cholesterol 12/19/2013 111  0 - 200 mg/dL Final   ATP III Classification       Desirable:  < 200 mg/dL               Borderline High:  200 - 239 mg/dL          High:  > = 846240 mg/dL  . Triglycerides 12/19/2013 62.0  0.0 - 149.0 mg/dL Final   Normal:  <962<150 mg/dLBorderline High:  150 - 199 mg/dL  . HDL 12/19/2013 35.80* >39.00 mg/dL Final  . VLDL 95/28/413210/21/2015 12.4  0.0 - 40.0 mg/dL Final  . LDL Cholesterol 12/19/2013 63  0 - 99  mg/dL Final  . Total CHOL/HDL Ratio 12/19/2013 3   Final                  Men          Women1/2 Average Risk     3.4          3.3Average Risk          5.0          4.42X Average Risk          9.6          7.13X Average Risk          15.0          11.0                      . NonHDL 12/19/2013 75.20   Final   NOTE:  Non-HDL goal should be 30 mg/dL higher than patient's LDL goal (i.e. LDL goal of < 70 mg/dL, would have non-HDL goal of < 100 mg/dL)  . Hemoglobin A1C 12/19/2013 8.2* 4.6 - 6.5 % Final   Glycemic Control Guidelines for People with Diabetes:Non Diabetic:  <6%Goal of Therapy: <7%Additional Action Suggested:  >8%

## 2014-01-01 ENCOUNTER — Ambulatory Visit: Payer: 59

## 2014-01-29 ENCOUNTER — Other Ambulatory Visit (INDEPENDENT_AMBULATORY_CARE_PROVIDER_SITE_OTHER): Payer: BC Managed Care – PPO

## 2014-01-29 DIAGNOSIS — IMO0002 Reserved for concepts with insufficient information to code with codable children: Secondary | ICD-10-CM

## 2014-01-29 DIAGNOSIS — E1165 Type 2 diabetes mellitus with hyperglycemia: Secondary | ICD-10-CM

## 2014-01-29 LAB — MICROALBUMIN / CREATININE URINE RATIO
Creatinine,U: 86.4 mg/dL
Microalb Creat Ratio: 67.6 mg/g — ABNORMAL HIGH (ref 0.0–30.0)
Microalb, Ur: 58.4 mg/dL — ABNORMAL HIGH (ref 0.0–1.9)

## 2014-01-30 LAB — BASIC METABOLIC PANEL
BUN: 27 mg/dL — ABNORMAL HIGH (ref 6–23)
CO2: 26 meq/L (ref 19–32)
Calcium: 9.7 mg/dL (ref 8.4–10.5)
Chloride: 101 mEq/L (ref 96–112)
Creatinine, Ser: 1.7 mg/dL — ABNORMAL HIGH (ref 0.4–1.5)
GFR: 52.71 mL/min — AB (ref >60.00)
GLUCOSE: 146 mg/dL — AB (ref 70–99)
POTASSIUM: 4.6 meq/L (ref 3.5–5.1)
SODIUM: 135 meq/L (ref 135–145)

## 2014-01-31 ENCOUNTER — Encounter: Payer: Self-pay | Admitting: Endocrinology

## 2014-01-31 ENCOUNTER — Ambulatory Visit (INDEPENDENT_AMBULATORY_CARE_PROVIDER_SITE_OTHER): Payer: BC Managed Care – PPO | Admitting: Endocrinology

## 2014-01-31 ENCOUNTER — Ambulatory Visit: Payer: 59 | Admitting: Endocrinology

## 2014-01-31 VITALS — BP 144/71 | HR 66 | Temp 98.3°F | Resp 16 | Ht 74.0 in | Wt 239.0 lb

## 2014-01-31 DIAGNOSIS — IMO0002 Reserved for concepts with insufficient information to code with codable children: Secondary | ICD-10-CM

## 2014-01-31 DIAGNOSIS — I10 Essential (primary) hypertension: Secondary | ICD-10-CM

## 2014-01-31 DIAGNOSIS — E1165 Type 2 diabetes mellitus with hyperglycemia: Secondary | ICD-10-CM

## 2014-01-31 DIAGNOSIS — N183 Chronic kidney disease, stage 3 unspecified: Secondary | ICD-10-CM

## 2014-01-31 NOTE — Progress Notes (Signed)
Patient ID: [name] [name], male   DOB: 07/31/1950, 63 y.o.   MRN: [id number]    Reason for Appointment: Follow-up for Type 2 Diabetes  Referring physician: [facility] Lanius  History of Present Illness:           Diagnosis: Type 2 diabetes mellitus, date of diagnosis: 1996       Past history: Metformin was used initially to control his diabetes but this was stopped because of reportedly having constipation Subsequently he had been on other oral hypoglycemic drugs and Januvia for the last 2-3 years Review of his outside records indicate that his A1c was 16.2% in January 2015 His A1c was high at 11.2% in 6/15 and he was referred here for further management.   Recent history:  Because of poor control with Januvia and glipizide he was instructed on starting Victoza in 08/2013 However he could not afford the prescription and continued on oral medications Although his blood sugars were improving with his trying to change his diet significantly and exercise regularly he still did not have adequate control with A1c over 8% In 10/15 he was started on Tanzeum 30 mg weekly and Januvia stopped With this his blood sugars started improving further and when his glucose was below 100 about a week ago he stopped his glipizide. With Tanzeum he is also able to control his portions a little better and has no side effects.  Currently is due to take his fifth injection He does have occasional high readings but he thinks these are from drinking flavored water which contains some sugar; did not realize this until recently The last few days his blood sugars have been mostly near normal although not checking after evening meal usually Fructosamine is pending  Oral hypoglycemic drugs the patient is taking are:      Side effects from medications have been:  ? Constipation from metformin  Glucose monitoring:  done one time a day or less        Glucometer: Currently Contour      Blood Glucose readings by download:  PRE-MEAL  Breakfast Lunch Dinner  PCS?   Overall  Glucose range:  139-150    95-110     Mean/median:     140   POST-MEAL PC Breakfast PC Lunch PC Dinner  Glucose range:  120-174   121-147    Mean/median:      Hypoglycemia: None      Glycemic control:  Past A1c 11.2 in 6/15  Lab Results  Component Value Date   HGBA1C 8.2* 12/19/2013   HGBA1C 8.3* 03/21/2012   Lab Results  Component Value Date   MICROALBUR 58.4* 01/29/2014   LDLCALC 63 12/19/2013   CREATININE 1.7* 01/29/2014    Self-care: The diet that the patient has been following is: tries to limit bread, eating more salads and vegetables.      Meals: 3 meals per day.         Exercise: Swims at times, active at work            Dietician visit: Most recent:2013.               Compliance with the medical regimen: Good Retinal exam: Most recent:4 yrs.    Weight history:  Wt Readings from Last 3 Encounters:  01/31/14 239 lb (108.41 kg)  12/26/13 245 lb 6.4 oz (111.313 kg)  10/12/13 254 lb (115.214 kg)      Medication List       This list is accurate as  of: 01/31/14 10:46 AM.  Always use your most recent med list.               Albiglutide 30 MG Pen  Commonly known as:  TANZEUM  Inject one pen per week     amLODipine 5 MG tablet  Commonly known as:  NORVASC  Take 1 tablet (5 mg total) by mouth daily.     aspirin EC 81 MG tablet  Take 1 tablet (81 mg total) by mouth daily.     atorvastatin 80 MG tablet  Commonly known as:  LIPITOR  Take 1 tablet (80 mg total) by mouth daily at 6 PM.     BAYER MICROLET LANCETS lancets  Use as instructed to check blood sugar once daily dx code 250.42     clopidogrel 75 MG tablet  Commonly known as:  PLAVIX  Take 1 tablet (75 mg total) by mouth daily.     glipiZIDE 10 MG 24 hr tablet  Commonly known as:  GLUCOTROL XL  Take 10 mg by mouth daily.     glucose blood test strip  Commonly known as:  ONETOUCH VERIO  Use as instructed to check blood sugar once a day dx code E11.29      hydrALAZINE 25 MG tablet  Commonly known as:  APRESOLINE  Take 1 tablet (25 mg total) by mouth 3 (three) times daily.     isosorbide mononitrate 60 MG 24 hr tablet  Commonly known as:  IMDUR  Take 1 tablet (60 mg total) by mouth daily.     multivitamin with minerals Tabs tablet  Take 1 tablet by mouth daily.     sitaGLIPtin 50 MG tablet  Commonly known as:  JANUVIA  Take 50 mg by mouth daily.        Allergies:  Allergies  Allergen Reactions  . Metformin And Related     Renal insufficiency    Past Medical History  Diagnosis Date  . Diabetes mellitus   . Obese   . Hypertension   . Coronary artery disease     s/p NSTEMI with multivessel CAD wit hoccluded RCA; myoview with inferior and inferoseptal infarct with minimal peri infarct ishemia - managed medically  . Myocardial infarction 03/2012  . Dysrhythmia 03/2012    bradycardia  . Heart murmur   . CKD (chronic kidney disease) stage 3, GFR 30-59 ml/min     CKD  . Neuromuscular disorder     TREMORS  . Arthritis   . Diabetes mellitus type 2 in obese   . PAF (paroxysmal atrial fibrillation) Jan 2014    Brief episode - no anticoagulation  . LV dysfunction Jan 2014    EF is 45 to 50% per echo    Past Surgical History  Procedure Laterality Date  . S/p knee surgery for torn ligament    . Rib fracture surgery    . Cardiac catheterization  03/21/2012    Family History  Problem Relation Age of Onset  . Heart disease Father     Social History:  reports that he has never smoked. He has never used smokeless tobacco. He reports that he does not drink alcohol or use illicit drugs.    Review of Systems       Lipids: On treatment with Lipitor 80 mg  since 1/14 with good control       Lab Results  Component Value Date   CHOL 111 12/19/2013   HDL 35.80* 12/19/2013   LDLCALC 63 12/19/2013  TRIG 62.0 12/19/2013   CHOLHDL 3 12/19/2013   History of coronary artery disease  Has history of chronic kidney disease  of unknown etiology  Lab Results  Component Value Date   CREATININE 1.7* 01/29/2014                 Physical Examination:  BP 144/71 mmHg  Pulse 66  Temp(Src) 98.3 F (36.8 C)  Resp 16  Ht 6\' 2"  (1.88 m)  Wt 239 lb (108.41 kg)  BMI 30.67 kg/m2  SpO2 99%   ASSESSMENT/PLAN:   Diabetes type 2, uncontrolled     His blood sugars appear to be improving significantly with changing Januvia to Tanzeum He is also not taking his glipizide now Continues to lose weight and is fairly active at his work although not doing any formal exercise For now will continue the same regimen as recent blood sugars are fairly good but reminded him to check more readings after meals He will call if blood sugars are out of the range and consider adding 2.5 mg glipizide ER Followup in 3 months  Hypertension: Blood pressure is high normal, to follow-up with PCP Since he has mild increase in urine microalbumin would recommend adding an ARB drug.  Renal dysfunction: Creatinine slightly better  Patient Instructions  Please check blood sugars at least half the time about 2 hours after any meal and 2-3 times per week on waking up.  Please bring blood sugar monitor to each visit  If sugars go higher call for new Rx for 2.5 Glipizide (call if am sugars > 130 or after meals >180)       Tasia Liz 01/31/2014, 10:46 AM   Note: This office note was prepared with Insurance underwriterDragon voice recognition system technology. Any transcriptional errors that result from this process are unintentional.  Appointment on 01/29/2014  Component Date Value Ref Range Status  . Sodium 01/29/2014 135  135 - 145 mEq/L Final  . Potassium 01/29/2014 4.6  3.5 - 5.1 mEq/L Final  . Chloride 01/29/2014 101  96 - 112 mEq/L Final  . CO2 01/29/2014 26  19 - 32 mEq/L Final  . Glucose, Bld 01/29/2014 146* 70 - 99 mg/dL Final  . BUN 40/98/119112/03/2013 27* 6 - 23 mg/dL Final  . Creatinine, Ser 01/29/2014 1.7* 0.4 - 1.5 mg/dL Final  . Calcium  47/82/956212/03/2013 9.7  8.4 - 10.5 mg/dL Final  . GFR 13/08/657812/03/2013 52.71* >60.00 mL/min Final  . Microalb, Ur 01/29/2014 58.4* 0.0 - 1.9 mg/dL Final  . Creatinine,U 46/96/295212/03/2013 86.4   Final  . Microalb Creat Ratio 01/29/2014 67.6* 0.0 - 30.0 mg/g Final

## 2014-01-31 NOTE — Patient Instructions (Addendum)
Please check blood sugars at least half the time about 2 hours after any meal and 2-3 times per week on waking up.  Please bring blood sugar monitor to each visit  If sugars go higher call for new Rx for 2.5 Glipizide (call if am sugars > 130 or after meals >180)

## 2014-02-01 LAB — FRUCTOSAMINE: Fructosamine: 322 umol/L — ABNORMAL HIGH (ref 190–270)

## 2014-02-07 ENCOUNTER — Encounter (HOSPITAL_COMMUNITY): Payer: Self-pay | Admitting: Cardiology

## 2014-04-23 ENCOUNTER — Other Ambulatory Visit (INDEPENDENT_AMBULATORY_CARE_PROVIDER_SITE_OTHER): Payer: BC Managed Care – PPO

## 2014-04-23 DIAGNOSIS — E1165 Type 2 diabetes mellitus with hyperglycemia: Secondary | ICD-10-CM

## 2014-04-23 DIAGNOSIS — IMO0002 Reserved for concepts with insufficient information to code with codable children: Secondary | ICD-10-CM

## 2014-04-23 LAB — HEMOGLOBIN A1C: Hgb A1c MFr Bld: 8 % — ABNORMAL HIGH (ref 4.6–6.5)

## 2014-04-23 LAB — BASIC METABOLIC PANEL
BUN: 31 mg/dL — ABNORMAL HIGH (ref 6–23)
CHLORIDE: 103 meq/L (ref 96–112)
CO2: 28 meq/L (ref 19–32)
Calcium: 9.1 mg/dL (ref 8.4–10.5)
Creatinine, Ser: 1.8 mg/dL — ABNORMAL HIGH (ref 0.40–1.50)
GFR: 48.97 mL/min — ABNORMAL LOW (ref >60.00)
GLUCOSE: 206 mg/dL — AB (ref 70–99)
Potassium: 4.3 mEq/L (ref 3.5–5.1)
SODIUM: 133 meq/L — AB (ref 135–145)

## 2014-05-02 ENCOUNTER — Ambulatory Visit (INDEPENDENT_AMBULATORY_CARE_PROVIDER_SITE_OTHER): Payer: BLUE CROSS/BLUE SHIELD | Admitting: Endocrinology

## 2014-05-02 ENCOUNTER — Encounter: Payer: Self-pay | Admitting: Endocrinology

## 2014-05-02 VITALS — BP 158/82 | HR 72 | Temp 97.7°F | Resp 16 | Ht 74.0 in | Wt 245.2 lb

## 2014-05-02 DIAGNOSIS — IMO0002 Reserved for concepts with insufficient information to code with codable children: Secondary | ICD-10-CM

## 2014-05-02 DIAGNOSIS — E1165 Type 2 diabetes mellitus with hyperglycemia: Secondary | ICD-10-CM

## 2014-05-02 MED ORDER — VICTOZA 18 MG/3ML ~~LOC~~ SOPN: 1.2000 mg | PEN_INJECTOR | Freq: Every day | SUBCUTANEOUS | Status: DC

## 2014-05-02 NOTE — Patient Instructions (Signed)
Start VICTOZA injection as shown once daily at the same time of the day.  Dial the dose to 0.6 mg on the pen for the2-3 days.  You may inject in the stomach, thigh or arm.    You will feel fullness of the stomach with starting the medication and should try to keep the portions at meals small. After 1 week increase the dose to 1.2mg  daily if no nausea present.   If any questions or concerns are present call the office or the Victoza [facility] helpline at [phone]. Visit [email or web address] for more useful information  Please check blood sugars at least half the time about 2 hours after any meal and 3 times per week on waking up. Please bring blood sugar monitor to each visit. Recommended blood sugar levels about 2 hours after meal is 140-180 and on waking up 90-130  Start walking more

## 2014-05-02 NOTE — Progress Notes (Signed)
Patient ID: [name] [name], male   DOB: 11/22/1950, 64 y.o.   MRN: [id number]    Reason for Appointment: Follow-up for Type 2 Diabetes  Referring physician: Providence Lanius  History of Present Illness:           Diagnosis: Type 2 diabetes mellitus, date of diagnosis: 1996       Past history: Metformin was used initially to control his diabetes but this was stopped because of reportedly having constipation Subsequently [name] [name] had been on other oral hypoglycemic drugs and Januvia for the last 2-3 years Review of his outside records indicate that his A1c was 16.2% in January 2015 His A1c was high at 11.2% in 6/15 and [name] [name] was referred here for further management.  Because of poor control with Januvia and glipizide [name] [name] was instructed on starting Victoza in 08/2013 Subsequently Victoza was stopped because of lack of insurance coverage for this  Recent history:   Although his blood sugars were improving with his trying to change his diet and use Victoza [name] [name] still did not have adequate control with A1c over 8% Since 10/15 [name] [name] was started on Tanzeum 30 mg weekly but his blood sugar control has been only fair [name] [name] had stopped his glipizide on his own prior to his last visit More recently has not been checking his blood sugar much a cause of traveling [name] [name] also [name] [name] thinks that because of being out of town in January [name] [name] did not take his medications with him and was not taking his Tanzeum  However his blood sugar is still appear to be relatively higher especially after meals and his A1c is not improved much [name] [name] is not checking his blood sugars after his evening meal as [name] [name] is eating very late  Oral hypoglycemic drugs the patient is taking are:  None     Side effects from medications have been:  Constipation from metformin  Glucose monitoring:  done 0.5 times a day       Glucometer: Currently One Touch Blood Glucose readings by download:  PRE-MEAL Breakfast Lunch Dinner Bedtime Overall  Glucose range:  123-145    125      Mean     155   POST-MEAL PC Breakfast PC Lunch PC Dinner  Glucose range:  159-202   124-214  ?    Mean/median:      Hypoglycemia: None      Glycemic control:  Previous A1c 11.2 in 6/15  Lab Results  Component Value Date   HGBA1C 8.0* 04/23/2014   HGBA1C 8.2* 12/19/2013   HGBA1C 8.3* 03/21/2012   Lab Results  Component Value Date   MICROALBUR 58.4* 01/29/2014   LDLCALC 63 12/19/2013   CREATININE 1.80* 04/23/2014    Self-care: The diet that the patient has been following is: tries to limit bread, eating more salads and vegetables.      Meals: 3 meals per day.         Exercise: Walks at times, active at work            Dietician visit: Most recent:2013.               Compliance with the medical regimen: Good Retinal exam: Most recent:4 yrs.    Weight history:  Wt Readings from Last 3 Encounters:  05/02/14 245 lb 3.2 oz (111.222 kg)  01/31/14 239 lb (108.41 kg)  12/26/13 245 lb 6.4 oz (111.313 kg)      Medication List       This list is accurate  as of: 05/02/14  1:30 PM.  Always use your most recent med list.               amLODipine 5 MG tablet  Commonly known as:  NORVASC  Take 1 tablet (5 mg total) by mouth daily.     aspirin EC 81 MG tablet  Take 1 tablet (81 mg total) by mouth daily.     atorvastatin 80 MG tablet  Commonly known as:  LIPITOR  Take 1 tablet (80 mg total) by mouth daily at 6 PM.     BAYER MICROLET LANCETS lancets  Use as instructed to check blood sugar once daily dx code 250.42     clopidogrel 75 MG tablet  Commonly known as:  PLAVIX  Take 1 tablet (75 mg total) by mouth daily.     glucose blood test strip  Commonly known as:  ONETOUCH VERIO  Use as instructed to check blood sugar once a day dx code E11.29     hydrALAZINE 25 MG tablet  Commonly known as:  APRESOLINE  Take 1 tablet (25 mg total) by mouth 3 (three) times daily.     isosorbide mononitrate 60 MG 24 hr tablet  Commonly known as:  IMDUR  Take 1 tablet (60 mg  total) by mouth daily.     multivitamin with minerals Tabs tablet  Take 1 tablet by mouth daily.     VICTOZA 18 MG/3ML Sopn  Generic drug:  Liraglutide  Inject 0.2 mLs (1.2 mg total) into the skin daily. Inject once daily at the same time        Allergies:  Allergies  Allergen Reactions  . Metformin And Related     Renal insufficiency    Past Medical History  Diagnosis Date  . Diabetes mellitus   . Obese   . Hypertension   . Coronary artery disease     s/p NSTEMI with multivessel CAD wit hoccluded RCA; myoview with inferior and inferoseptal infarct with minimal peri infarct ishemia - managed medically  . Myocardial infarction 03/2012  . Dysrhythmia 03/2012    bradycardia  . Heart murmur   . CKD (chronic kidney disease) stage 3, GFR 30-59 ml/min     CKD  . Neuromuscular disorder     TREMORS  . Arthritis   . Diabetes mellitus type 2 in obese   . PAF (paroxysmal atrial fibrillation) Jan 2014    Brief episode - no anticoagulation  . LV dysfunction Jan 2014    EF is 45 to 50% per echo    Past Surgical History  Procedure Laterality Date  . S/p knee surgery for torn ligament    . Rib fracture surgery    . Cardiac catheterization  03/21/2012  . Left heart catheterization with coronary angiogram N/A 03/21/2012    Procedure: LEFT HEART CATHETERIZATION WITH CORONARY ANGIOGRAM;  Surgeon: Peter M Swaziland, MD;  Location: [facility] CATH LAB;  Service: Cardiovascular;  Laterality: N/A;    Family History  Problem Relation Age of Onset  . Heart disease Father     Social History:  reports that [name] [name] has never smoked. [name] [name] has never used smokeless tobacco. [name] [name] reports that [name] [name] does not drink alcohol or use illicit drugs.    Review of Systems       Lipids: On treatment with Lipitor 80 mg  since 1/14 with good control       Lab Results  Component Value Date   CHOL 111 12/19/2013   HDL 35.80* 12/19/2013  LDLCALC 63 12/19/2013   TRIG 62.0 12/19/2013   CHOLHDL 3 12/19/2013   History of  coronary artery disease, currently asymptomatic  Has history of chronic kidney disease of unknown etiology, relatively stable  Lab Results  Component Value Date   CREATININE 1.80* 04/23/2014                 Physical Examination:  BP 158/82 mmHg  Pulse 72  Temp(Src) 97.7 F (36.5 C)  Resp 16  Ht  (1.88 m)  Wt 245 lb 3.2 oz (111.222 kg)  BMI 31.47 kg/m2  SpO2 98%   ASSESSMENT/PLAN:   Diabetes type 2, uncontrolled     His blood sugars appear to be not as well controlled as on his last visit A1c is still around 8% Although [name] [name] was previously taking glipizide this was stopped when [name] [name] had low normal blood sugars Currently appears that his insurance company is not going to cover his Tanzeum and [name] [name] probably will benefit from going back to Victoza which is covered and [name] [name] had tried previously [name] [name] also thinks [name] [name] has had less than optimal control because of stress related to family death, traveling and difficulty with compliance with medications and lack of exercise Discussed how Victoza will be dosed [name] [name] will call if blood sugars are out of the range and consider adding 2.5 mg glipizide or 1 mg Amaryl at suppertime Does need more blood sugars after supper also Encouraged him to get back into his aerobic activity with swimming or increased walking Followup in 2 months  Hypertension: Blood pressure is high normal, to follow-up with PCP Since [name] [name] has mild increase in urine microalbumin would recommend adding an ARB drug.  Renal dysfunction: Creatinine still abnormal  Counseling time over 50% of today's 25 minute visit  Patient Instructions  Start VICTOZA injection as shown once daily at the same time of the day.  Dial the dose to 0.6 mg on the pen for the2-3 days.  You may inject in the stomach, thigh or arm.    You will feel fullness of the stomach with starting the medication and should try to keep the portions at meals small. After 1 week increase the dose to 1.2mg  daily if no  nausea present.   If any questions or concerns are present call the office or the Victoza [facility] helpline at [phone]. Visit [email or web address] for more useful information  Please check blood sugars at least half the time about 2 hours after any meal and 3 times per week on waking up. Please bring blood sugar monitor to each visit. Recommended blood sugar levels about 2 hours after meal is 140-180 and on waking up 90-130  Start walking more       Siddh Vandeventer 05/02/2014, 1:30 PM   Note: This office note was prepared with Insurance underwriter. Any transcriptional errors that result from this process are unintentional.  No visits with results within 1 Week(s) from this visit. Latest known visit with results is:  Appointment on 04/23/2014  Component Date Value Ref Range Status  . Hgb A1c MFr Bld 04/23/2014 8.0* 4.6 - 6.5 % Final   Glycemic Control Guidelines for People with Diabetes:Non Diabetic:  <6%Goal of Therapy: <7%Additional Action Suggested:  >8%   . Sodium 04/23/2014 133* 135 - 145 mEq/L Final  . Potassium 04/23/2014 4.3  3.5 - 5.1 mEq/L Final  . Chloride 04/23/2014 103  96 - 112 mEq/L Final  . CO2 04/23/2014 28  19 - 32 mEq/L Final  .  Glucose, Bld 04/23/2014 206* 70 - 99 mg/dL Final  . BUN 16/10/960402/23/2016 31* 6 - 23 mg/dL Final  . Creatinine, Ser 04/23/2014 1.80* 0.40 - 1.50 mg/dL Final  . Calcium 54/09/811902/23/2016 9.1  8.4 - 10.5 mg/dL Final  . GFR 14/78/295602/23/2016 48.97* >60.00 mL/min Final

## 2014-06-28 ENCOUNTER — Other Ambulatory Visit: Payer: BLUE CROSS/BLUE SHIELD

## 2014-07-03 ENCOUNTER — Ambulatory Visit: Payer: BLUE CROSS/BLUE SHIELD | Admitting: Endocrinology

## 2014-07-11 ENCOUNTER — Other Ambulatory Visit (INDEPENDENT_AMBULATORY_CARE_PROVIDER_SITE_OTHER): Payer: BLUE CROSS/BLUE SHIELD

## 2014-07-11 DIAGNOSIS — E1165 Type 2 diabetes mellitus with hyperglycemia: Secondary | ICD-10-CM

## 2014-07-11 DIAGNOSIS — IMO0002 Reserved for concepts with insufficient information to code with codable children: Secondary | ICD-10-CM

## 2014-07-11 LAB — BASIC METABOLIC PANEL
BUN: 24 mg/dL — ABNORMAL HIGH (ref 6–23)
CALCIUM: 9.8 mg/dL (ref 8.4–10.5)
CHLORIDE: 101 meq/L (ref 96–112)
CO2: 29 meq/L (ref 19–32)
Creatinine, Ser: 1.69 mg/dL — ABNORMAL HIGH (ref 0.40–1.50)
GFR: 52.63 mL/min — ABNORMAL LOW (ref >60.00)
GLUCOSE: 90 mg/dL (ref 70–99)
POTASSIUM: 4 meq/L (ref 3.5–5.1)
SODIUM: 136 meq/L (ref 135–145)

## 2014-07-12 LAB — FRUCTOSAMINE: Fructosamine: 450 umol/L — ABNORMAL HIGH (ref 0–285)

## 2014-07-16 ENCOUNTER — Ambulatory Visit: Payer: BLUE CROSS/BLUE SHIELD | Admitting: Endocrinology

## 2014-07-18 ENCOUNTER — Ambulatory Visit (INDEPENDENT_AMBULATORY_CARE_PROVIDER_SITE_OTHER): Payer: BLUE CROSS/BLUE SHIELD | Admitting: Endocrinology

## 2014-07-18 ENCOUNTER — Encounter: Payer: Self-pay | Admitting: Endocrinology

## 2014-07-18 ENCOUNTER — Other Ambulatory Visit: Payer: Self-pay | Admitting: *Deleted

## 2014-07-18 VITALS — BP 138/64 | HR 61 | Temp 98.0°F | Resp 16 | Ht 74.0 in | Wt 244.4 lb

## 2014-07-18 DIAGNOSIS — E1165 Type 2 diabetes mellitus with hyperglycemia: Secondary | ICD-10-CM

## 2014-07-18 DIAGNOSIS — I10 Essential (primary) hypertension: Secondary | ICD-10-CM

## 2014-07-18 DIAGNOSIS — IMO0002 Reserved for concepts with insufficient information to code with codable children: Secondary | ICD-10-CM

## 2014-07-18 MED ORDER — VICTOZA 18 MG/3ML ~~LOC~~ SOPN: 1.8000 mg | PEN_INJECTOR | Freq: Every day | SUBCUTANEOUS | Status: DC

## 2014-07-18 MED ORDER — PIOGLITAZONE HCL 15 MG PO TABS: 15.0000 mg | ORAL_TABLET | Freq: Every day | ORAL | Status: DC

## 2014-07-18 NOTE — Patient Instructions (Addendum)
Victoza 1.8mg  daily  Actos 1 daily  Check blood sugars on waking up ..3  .. times a week Also check blood sugars about 2 hours after a meal and do this after different meals by rotation  Recommended blood sugar levels on waking up is 90-130 and about 2 hours after meal is 140-180 Please bring blood sugar monitor to each visit.  Call if having any low sugars

## 2014-07-18 NOTE — Progress Notes (Signed)
Patient ID: [name] HomesCarl King, male   DOB: 10/20/1950, 64 y.o.   MRN: [id number]    Reason for Appointment: Follow-up for Type 2 Diabetes  Referring physician: Providence LaniusHowell  History of Present Illness:           Diagnosis: Type 2 diabetes mellitus, date of diagnosis: 1996       Past history: Metformin was used initially to control his diabetes but this was stopped because of reportedly having constipation Subsequently he had been on other oral hypoglycemic drugs and Januvia for the last 2-3 years Review of his outside records indicate that his A1c was 16.2% in January 2015 His A1c was high at 11.2% in 6/15 and he was referred here for further management.  Because of poor control with Januvia and glipizide he was instructed on starting Victoza in 08/2013 Subsequently Victoza was stopped because of lack of insurance coverage for this  Recent history:   He now says that he was unable to get Victoza for 6 weeks because of financial reasons He started back on 1.2 mg only 3 weeks ago, previously was taking only glipizide On his last visit he did not want to continue Tanzeum because of lack of insurance coverage for this He has not taken any other oral hypoglycemic drugs except Januvia in the past  However his blood sugar levels are still mostly high with only a few good readings in the mornings and once at bedtime He does not know how to explain the high readings He thinks he is generally following his diet and he thinks he knows what to do with his diet Has only recently started going to the gym  Oral hypoglycemic drugs the patient is taking are:  None     Side effects from medications have been:  Constipation from metformin  Glucose monitoring:  done 0.5 times a day       Glucometer: Currently One Touch Blood Glucose readings by download:  PRE-MEAL Breakfast Lunch Dinner Bedtime Overall  Glucose range: 109-203   114, 268   Mean/median:     177   POST-MEAL PC Breakfast PC Lunch PC Dinner  Glucose  range: 134, 254 312   Mean/median:       Hypoglycemia: None      Glycemic control:  Previous A1c 11.2 in 6/15  Lab Results  Component Value Date   HGBA1C 8.0* 04/23/2014   HGBA1C 8.2* 12/19/2013   HGBA1C 8.3* 03/21/2012   Lab Results  Component Value Date   MICROALBUR 58.4* 01/29/2014   LDLCALC 63 12/19/2013   CREATININE 1.69* 07/11/2014    Self-care: The diet that the patient has been following is: tries to limit bread, eating more salads and vegetables.             Exercise: Walks at times, active at work            Dietician visit: Most recent:2013.               Compliance with the medical regimen: Good Retinal exam: Most recent:4 yrs.    Weight history:  Wt Readings from Last 3 Encounters:  07/18/14 244 lb 6.4 oz (110.859 kg)  05/02/14 245 lb 3.2 oz (111.222 kg)  01/31/14 239 lb (108.41 kg)      Medication List       This list is accurate as of: 07/18/14  8:16 AM.  Always use your most recent med list.  amLODipine 5 MG tablet  Commonly known as:  NORVASC  Take 1 tablet (5 mg total) by mouth daily.     aspirin EC 81 MG tablet  Take 1 tablet (81 mg total) by mouth daily.     atorvastatin 80 MG tablet  Commonly known as:  LIPITOR  Take 1 tablet (80 mg total) by mouth daily at 6 PM.     BAYER MICROLET LANCETS lancets  Use as instructed to check blood sugar once daily dx code 250.42     clopidogrel 75 MG tablet  Commonly known as:  PLAVIX  Take 1 tablet (75 mg total) by mouth daily.     glucose blood test strip  Commonly known as:  ONETOUCH VERIO  Use as instructed to check blood sugar once a day dx code E11.29     hydrALAZINE 25 MG tablet  Commonly known as:  APRESOLINE  Take 1 tablet (25 mg total) by mouth 3 (three) times daily.     isosorbide mononitrate 60 MG 24 hr tablet  Commonly known as:  IMDUR  Take 1 tablet (60 mg total) by mouth daily.     multivitamin with minerals Tabs tablet  Take 1 tablet by mouth daily.      VICTOZA 18 MG/3ML Sopn  Generic drug:  Liraglutide  Inject 0.2 mLs (1.2 mg total) into the skin daily. Inject once daily at the same time        Allergies:  Allergies  Allergen Reactions  . Metformin And Related     Renal insufficiency    Past Medical History  Diagnosis Date  . Diabetes mellitus   . Obese   . Hypertension   . Coronary artery disease     s/p NSTEMI with multivessel CAD wit hoccluded RCA; myoview with inferior and inferoseptal infarct with minimal peri infarct ishemia - managed medically  . Myocardial infarction 03/2012  . Dysrhythmia 03/2012    bradycardia  . Heart murmur   . CKD (chronic kidney disease) stage 3, GFR 30-59 ml/min     CKD  . Neuromuscular disorder     TREMORS  . Arthritis   . Diabetes mellitus type 2 in obese   . PAF (paroxysmal atrial fibrillation) Jan 2014    Brief episode - no anticoagulation  . LV dysfunction Jan 2014    EF is 45 to 50% per echo    Past Surgical History  Procedure Laterality Date  . S/p knee surgery for torn ligament    . Rib fracture surgery    . Cardiac catheterization  03/21/2012  . Left heart catheterization with coronary angiogram N/A 03/21/2012    Procedure: LEFT HEART CATHETERIZATION WITH CORONARY ANGIOGRAM;  Surgeon: Peter M Swaziland, MD;  Location: [facility] CATH LAB;  Service: Cardiovascular;  Laterality: N/A;    Family History  Problem Relation Age of Onset  . Heart disease Father     Social History:  reports that he has never smoked. He has never used smokeless tobacco. He reports that he does not drink alcohol or use illicit drugs.    Review of Systems       Lipids: On treatment with Lipitor 80 mg  since 1/14 with good control       Lab Results  Component Value Date   CHOL 111 12/19/2013   HDL 35.80* 12/19/2013   LDLCALC 63 12/19/2013   TRIG 62.0 12/19/2013   CHOLHDL 3 12/19/2013   History of coronary artery disease, currently asymptomatic  Has history of chronic  kidney disease of unknown  etiology, relatively stable  Lab Results  Component Value Date   CREATININE 1.69* 07/11/2014                 Physical Examination:  BP 138/64 mmHg  Pulse 61  Temp(Src) 98 F (36.7 C)  Resp 16  Ht 6\' 2"  (1.88 m)  Wt 244 lb 6.4 oz (110.859 kg)  BMI 31.37 kg/m2  SpO2 99%   ASSESSMENT/PLAN:   Diabetes type 2, uncontrolled     His blood sugars appear to be not as well controlled as on his last visit A1c was high previously and now fructosamine is significantly high probably indicating high readings overall He thinks his blood sugars will do better with continuing Victoza but most likely will benefit from increasing the dose to 1.8 mg also Most likely he does need an insulin sensitizer also and since he cannot take metformin will start him on low dose Actos, discussed how this works and possible side effects of edema  Hypertension: Blood pressure is better today Since he has mild increase in urine microalbumin would recommend adding an ARB drug such as 25 mg losartan.  Renal dysfunction: Creatinine still mildly abnormal  Osborne Serio 07/18/2014, 8:16 AM   Note: This office note was prepared with Insurance underwriterDragon voice recognition system technology. Any transcriptional errors that result from this process are unintentional.  No visits with results within 1 Week(s) from this visit. Latest known visit with results is:  Lab on 07/11/2014  Component Date Value Ref Range Status  . Sodium 07/11/2014 136  135 - 145 mEq/L Final  . Potassium 07/11/2014 4.0  3.5 - 5.1 mEq/L Final  . Chloride 07/11/2014 101  96 - 112 mEq/L Final  . CO2 07/11/2014 29  19 - 32 mEq/L Final  . Glucose, Bld 07/11/2014 90  70 - 99 mg/dL Final  . BUN 16/10/960405/01/2015 24* 6 - 23 mg/dL Final  . Creatinine, Ser 07/11/2014 1.69* 0.40 - 1.50 mg/dL Final  . Calcium 54/09/811905/01/2015 9.8  8.4 - 10.5 mg/dL Final  . GFR 14/78/295605/01/2015 52.63* >60.00 mL/min Final  . Fructosamine 07/11/2014 450* 0 - 285 umol/L Final   Comment: Published  reference interval for apparently healthy subjects between age 65 and 7260 is 30205 - 285 umol/L and in a poorly controlled diabetic population is 228 - 563 umol/L with a mean of 396 umol/L.

## 2014-07-22 ENCOUNTER — Telehealth: Payer: Self-pay | Admitting: Physician Assistant

## 2014-07-22 NOTE — Telephone Encounter (Signed)
New Message       Office calling to find out if we have received referral for this pt. Please call back and advise.

## 2014-07-23 NOTE — Telephone Encounter (Signed)
[name] in regards to her call 5/23 about referral. Not sure what referral she is referring to. Pt last seen 2014, has appt with Lorin PicketScott W. PA 08/12/14.

## 2014-08-11 NOTE — Progress Notes (Signed)
Cardiology Office Note   Date:  08/11/2014   ID:  [name] [name], DOB 1950-11-07, MRN [id number]  PCP:  Devra Dopp, MD  Cardiologist:  Dr. Peter Swaziland     No chief complaint on file.    History of Present Illness: [name] [name] is a 63 y.o. male with a hx of    Studies/Reports Reviewed Today:  Myoview 03/28/12 IMPRESSION: 1. Mild LV systolic dysfunction, EF 42% with inferior and inferoseptal hypokinesis. 2. Fixed medium-sized, severe basal to mid inferior and inferoseptal perfusion defect. This suggests prior infarction with minimal ischemia. 3. Intermediate risk study.   LHC 03/21/12 Left mainstem: 30% distal left main. Left anterior descending (LAD): Mild wall irregularities. There is a large diagonal with a 90-95% ostial lesion. Left circumflex (LCx): The left circumflex gives rise to a single large marginal branch. There is a 70-80% ostial LCX lesion. Right coronary artery (RCA): The RCA is occluded proximally with evidence of recent thrombus. There are left to right collaterals to the distal RCA. Left ventriculography: Not performed. Final Conclusions:  1. 3 vessel obstructive CAD. Recent RCA occlusion. Recommendations: Would maximize medical therapy. Consider stress myoview on medical therapy to assess ischemic burden and symptoms. Ostial location of diagonal and LCX disease is not optimal for PCI. If significant symptoms or ischemia on medical therapy may need to consider CABG.  Echo 03/21/12 - Left ventricle: The cavity size was normal. Wall thickness was increased in a pattern of mild LVH. Systolic function was mildly reduced. The estimated ejection fraction was in the range of 45% to 50%. There is akinesis of the inferior myocardium. Doppler parameters are consistent with abnormal left ventricular relaxation (grade 1 diastolic dysfunction). - Mitral valve: Mild regurgitation.    Past Medical History  Diagnosis Date  . Diabetes mellitus   .  Obese   . Hypertension   . Coronary artery disease     s/p NSTEMI with multivessel CAD wit hoccluded RCA; myoview with inferior and inferoseptal infarct with minimal peri infarct ishemia - managed medically  . Myocardial infarction 03/2012  . Dysrhythmia 03/2012    bradycardia  . Heart murmur   . CKD (chronic kidney disease) stage 3, GFR 30-59 ml/min     CKD  . Neuromuscular disorder     TREMORS  . Arthritis   . Diabetes mellitus type 2 in obese   . PAF (paroxysmal atrial fibrillation) Jan 2014    Brief episode - no anticoagulation  . LV dysfunction Jan 2014    EF is 45 to 50% per echo    Past Surgical History  Procedure Laterality Date  . S/p knee surgery for torn ligament    . Rib fracture surgery    . Cardiac catheterization  03/21/2012  . Left heart catheterization with coronary angiogram N/A 03/21/2012    Procedure: LEFT HEART CATHETERIZATION WITH CORONARY ANGIOGRAM;  Surgeon: Peter M Swaziland, MD;  Location: [facility] CATH LAB;  Service: Cardiovascular;  Laterality: N/A;     Current Outpatient Prescriptions  Medication Sig Dispense Refill  . amLODipine (NORVASC) 5 MG tablet Take 1 tablet (5 mg total) by mouth daily. 30 tablet 6  . aspirin EC 81 MG tablet Take 1 tablet (81 mg total) by mouth daily.    Marland Kitchen atorvastatin (LIPITOR) 80 MG tablet Take 1 tablet (80 mg total) by mouth daily at 6 PM. 30 tablet 6  . BAYER MICROLET LANCETS lancets Use as instructed to check blood sugar once daily dx code 250.42 100 each 1  .  clopidogrel (PLAVIX) 75 MG tablet Take 1 tablet (75 mg total) by mouth daily. 30 tablet 6  . glipiZIDE (GLUCOTROL XL) 10 MG 24 hr tablet Take 10 mg by mouth daily with breakfast.    . glucose blood (ONETOUCH VERIO) test strip Use as instructed to check blood sugar once a day dx code E11.29 50 each 5  . hydrALAZINE (APRESOLINE) 25 MG tablet Take 1 tablet (25 mg total) by mouth 3 (three) times daily. (Patient taking differently: Take 25 mg by mouth daily. ) 90 tablet 3  .  isosorbide mononitrate (IMDUR) 60 MG 24 hr tablet Take 1 tablet (60 mg total) by mouth daily. 30 tablet 6  . Multiple Vitamin (MULTIVITAMIN WITH MINERALS) TABS Take 1 tablet by mouth daily.    . pioglitazone (ACTOS) 15 MG tablet Take 1 tablet (15 mg total) by mouth daily. 90 tablet 1  . VICTOZA 18 MG/3ML SOPN Inject 0.3 mLs (1.8 mg total) into the skin daily. Inject once daily at the same time 3 pen 3   No current facility-administered medications for this visit.    Allergies:   Metformin and related    Social History:  The patient  reports that he has never smoked. He has never used smokeless tobacco. He reports that he does not drink alcohol or use illicit drugs.   Family History:  The patient's family history includes Heart disease in his father.    ROS:   Please see the history of present illness.   ROS    PHYSICAL EXAM: VS:  There were no vitals taken for this visit.    Wt Readings from Last 3 Encounters:  07/18/14 244 lb 6.4 oz (110.859 kg)  05/02/14 245 lb 3.2 oz (111.222 kg)  01/31/14 239 lb (108.41 kg)     GEN: Well nourished, well developed, in no acute distress HEENT: normal Neck: no JVD, no carotid bruits, no masses Cardiac:  Normal S1/S2, RRR; no murmur ,  no rubs or gallops, no edema  Respiratory:  clear to auscultation bilaterally, no wheezing, rhonchi or rales. GI: soft, nontender, nondistended, + BS MS: no deformity or atrophy Skin: warm and dry  Neuro:  CNs II-XII intact, Strength and sensation are intact Psych: Normal affect   EKG:  EKG is ordered today.  It demonstrates:      Recent Labs: 12/19/2013: ALT 30 07/11/2014: BUN 24*; Creatinine, Ser 1.69*; Potassium 4.0; Sodium 136    Lipid Panel    Component Value Date/Time   CHOL 111 12/19/2013 0851   TRIG 62.0 12/19/2013 0851   HDL 35.80* 12/19/2013 0851   CHOLHDL 3 12/19/2013 0851   VLDL 12.4 12/19/2013 0851   LDLCALC 63 12/19/2013 0851      ASSESSMENT AND PLAN:  No diagnosis found.      Current medicines are reviewed at length with the patient today.  Concerns regarding medicines are as outlined above.  The following changes have been made:    As above  Labs/ tests ordered today include:   No orders of the defined types were placed in this encounter.     Disposition:   FU with    Signed, Brynda Rim, MHS 08/11/2014 10:49 PM    [facility] [address] [place], [place], [place]  [address] Phone: [phone]; Fax: [phone]    This encounter was created in error - please disregard.

## 2014-08-12 ENCOUNTER — Encounter: Payer: Medicare HMO | Admitting: Physician Assistant

## 2014-08-27 ENCOUNTER — Other Ambulatory Visit: Payer: Self-pay | Admitting: *Deleted

## 2014-08-27 ENCOUNTER — Other Ambulatory Visit (INDEPENDENT_AMBULATORY_CARE_PROVIDER_SITE_OTHER): Payer: Medicare HMO

## 2014-08-27 DIAGNOSIS — E1165 Type 2 diabetes mellitus with hyperglycemia: Secondary | ICD-10-CM

## 2014-08-27 DIAGNOSIS — IMO0002 Reserved for concepts with insufficient information to code with codable children: Secondary | ICD-10-CM

## 2014-08-27 LAB — BASIC METABOLIC PANEL
BUN: 31 mg/dL — ABNORMAL HIGH (ref 6–23)
CO2: 27 meq/L (ref 19–32)
Calcium: 9.4 mg/dL (ref 8.4–10.5)
Chloride: 102 mEq/L (ref 96–112)
Creatinine, Ser: 1.55 mg/dL — ABNORMAL HIGH (ref 0.40–1.50)
GFR: 58.13 mL/min — AB (ref >60.00)
GLUCOSE: 99 mg/dL (ref 70–99)
Potassium: 4.1 mEq/L (ref 3.5–5.1)
Sodium: 135 mEq/L (ref 135–145)

## 2014-08-27 LAB — HEMOGLOBIN A1C: HEMOGLOBIN A1C: 8 % — AB (ref 4.6–6.5)

## 2014-08-27 MED ORDER — GLUCOSE BLOOD VI STRP: ORAL_STRIP | Status: AC

## 2014-08-30 ENCOUNTER — Ambulatory Visit (INDEPENDENT_AMBULATORY_CARE_PROVIDER_SITE_OTHER): Payer: Medicare HMO | Admitting: Endocrinology

## 2014-08-30 ENCOUNTER — Encounter: Payer: Self-pay | Admitting: Endocrinology

## 2014-08-30 VITALS — BP 126/80 | HR 52 | Temp 98.4°F | Resp 15 | Ht 74.0 in | Wt 249.0 lb

## 2014-08-30 DIAGNOSIS — N183 Chronic kidney disease, stage 3 unspecified: Secondary | ICD-10-CM

## 2014-08-30 DIAGNOSIS — E785 Hyperlipidemia, unspecified: Secondary | ICD-10-CM | POA: Diagnosis not present

## 2014-08-30 DIAGNOSIS — E1165 Type 2 diabetes mellitus with hyperglycemia: Secondary | ICD-10-CM

## 2014-08-30 DIAGNOSIS — I10 Essential (primary) hypertension: Secondary | ICD-10-CM

## 2014-08-30 DIAGNOSIS — IMO0002 Reserved for concepts with insufficient information to code with codable children: Secondary | ICD-10-CM

## 2014-08-30 NOTE — Patient Instructions (Addendum)
Check blood sugars on waking up ..2  .. times a week Also check blood sugars about 2 hours after a meal and do this after different meals by rotation  Recommended blood sugar levels on waking up is 90-130 and about 2 hours after meal is 140-180 Please bring blood sugar monitor to each visit.  If sugar 2 hrs after meal is > 180 change that food; add low fat protein to each meal

## 2014-08-30 NOTE — Progress Notes (Signed)
Patient ID: [name] [name], male   DOB: Nov 04, 1950, 64 y.o.   MRN: [id number]    Reason for Appointment: Follow-up for Type 2 Diabetes  Referring physician: Providence Lanius  History of Present Illness:           Diagnosis: Type 2 diabetes mellitus, date of diagnosis: 1996       Past history: Metformin was used initially to control his diabetes but this was stopped because of reportedly having constipation Subsequently he had been on other oral hypoglycemic drugs and Januvia for the last 2-3 years Review of his outside records indicate that his A1c was 16.2% in January 2015 His A1c was high at 11.2% in 6/15 and he was referred here for further management.  Because of poor control with Januvia and glipizide he was instructed on starting Victoza in 08/2013 Subsequently Victoza was stopped because of lack of insurance coverage for this  Recent history:  He told to go up on his Victoza to 1.8 mg on his last visit when his A1c was 8% Also started on Actos 15 mg daily and glipizide was continued No side effects from the higher dose of Victoza but his weight has actually gone up  Although his A1c is still 8% he does not appear to have as many high blood sugar spikes as on the last visit He does check his sugar mostly in the morning and sometimes after lunch but now he says that he is checking readings about 30 minutes after eating Fasting glucose in the lab was normal He thinks he is generally following his diet and he thinks he knows what to do with his diet but is generally not getting enough protein and may a lot of cereal especially in the morning and sometimes at bedtime.  Eating more salads but not always getting protein  Oral hypoglycemic drugs the patient is taking are:  Glipizide ER 10 mg, Actos 15 mg     Side effects from medications have been:  Constipation from metformin  Glucose monitoring:  done 0.5 times a day       Glucometer: Currently One Touch Blood Glucose readings by download:  Mean  values apply above for all meters except median for One Touch  PRE-MEAL Fasting  9 AM  Dinner Bedtime Overall  Glucose range: ?    84-160      Mean/median:     145   POST-MEAL PC Breakfast PC Lunch PC Dinner  Glucose range:  129-276   124-176    Mean/median:  146       Hypoglycemia: None      Glycemic control:  Previous A1c 11.2 in 6/15  Lab Results  Component Value Date   HGBA1C 8.0* 08/27/2014   HGBA1C 8.0* 04/23/2014   HGBA1C 8.2* 12/19/2013   Lab Results  Component Value Date   MICROALBUR 58.4* 01/29/2014   LDLCALC 63 12/19/2013   CREATININE 1.55* 08/27/2014    Self-care: The diet that the patient has been following is: tries to limit bread, eating more salads and vegetables.     Dinner 7 pm         Exercise: Walks at times, active at work            [employer] visit: Most recent:2013.               Compliance with the medical regimen: Good Retinal exam: Most recent:4 yrs.    Weight history:  Wt Readings from Last 3 Encounters:  08/30/14 249 lb (112.946  kg)  07/18/14 244 lb 6.4 oz (110.859 kg)  05/02/14 245 lb 3.2 oz (111.222 kg)      Medication List       This list is accurate as of: 08/30/14  8:38 AM.  Always use your most recent med list.               amLODipine 5 MG tablet  Commonly known as:  NORVASC  Take 1 tablet (5 mg total) by mouth daily.     amLODipine 10 MG tablet  Commonly known as:  NORVASC  Take 10 mg by mouth.     aspirin EC 81 MG tablet  Take 1 tablet (81 mg total) by mouth daily.     atorvastatin 80 MG tablet  Commonly known as:  LIPITOR  Take 1 tablet (80 mg total) by mouth daily at 6 PM.     BAYER MICROLET LANCETS lancets  Use as instructed to check blood sugar once daily dx code 250.42     clopidogrel 75 MG tablet  Commonly known as:  PLAVIX  Take 1 tablet (75 mg total) by mouth daily.     glipiZIDE 10 MG 24 hr tablet  Commonly known as:  GLUCOTROL XL  Take 10 mg by mouth daily with breakfast.     glucose blood  test strip  Commonly known as:  ONETOUCH VERIO  Use as instructed to check blood sugar once a day dx code E11.29     hydrALAZINE 25 MG tablet  Commonly known as:  APRESOLINE  Take 1 tablet (25 mg total) by mouth 3 (three) times daily.     isosorbide mononitrate 60 MG 24 hr tablet  Commonly known as:  IMDUR  Take 1 tablet (60 mg total) by mouth daily.     multivitamin with minerals Tabs tablet  Take 1 tablet by mouth daily.     pioglitazone 15 MG tablet  Commonly known as:  ACTOS  Take 1 tablet (15 mg total) by mouth daily.     VICTOZA 18 MG/3ML Sopn  Generic drug:  Liraglutide  Inject 1.2 mg into the skin.     VICTOZA 18 MG/3ML Sopn  Generic drug:  Liraglutide  Inject 0.3 mLs (1.8 mg total) into the skin daily. Inject once daily at the same time        Allergies:  Allergies  Allergen Reactions  . Metformin And Related     Renal insufficiency    Past Medical History  Diagnosis Date  . Diabetes mellitus   . Obese   . Hypertension   . Coronary artery disease     s/p NSTEMI with multivessel CAD wit hoccluded RCA; myoview with inferior and inferoseptal infarct with minimal peri infarct ishemia - managed medically  . Myocardial infarction 03/2012  . Dysrhythmia 03/2012    bradycardia  . Heart murmur   . CKD (chronic kidney disease) stage 3, GFR 30-59 ml/min     CKD  . Neuromuscular disorder     TREMORS  . Arthritis   . Diabetes mellitus type 2 in obese   . PAF (paroxysmal atrial fibrillation) Jan 2014    Brief episode - no anticoagulation  . LV dysfunction Jan 2014    EF is 45 to 50% per echo    Past Surgical History  Procedure Laterality Date  . S/p knee surgery for torn ligament    . Rib fracture surgery    . Cardiac catheterization  03/21/2012  . Left heart catheterization with coronary angiogram  N/A 03/21/2012    Procedure: LEFT HEART CATHETERIZATION WITH CORONARY ANGIOGRAM;  Surgeon: Peter M Swaziland, MD;  Location: [facility] CATH LAB;  Service: Cardiovascular;   Laterality: N/A;    Family History  Problem Relation Age of Onset  . Heart disease Father     Social History:  reports that he has never smoked. He has never used smokeless tobacco. He reports that he does not drink alcohol or use illicit drugs.    Review of Systems       Lipids: On treatment with Lipitor 80 mg  since 1/14 with good control       Lab Results  Component Value Date   CHOL 111 12/19/2013   HDL 35.80* 12/19/2013   LDLCALC 63 12/19/2013   TRIG 62.0 12/19/2013   CHOLHDL 3 12/19/2013   History of coronary artery disease, currently asymptomatic  Has history of chronic kidney disease of unknown etiology, relatively stable He periodically takes ibuprofen, usually one a day  He has had mild increase in microalbumin, losartan has been recommended to PCP who is managing blood pressure occasions  Lab Results  Component Value Date   CREATININE 1.55* 08/27/2014                 Physical Examination:  BP 126/80 mmHg  Pulse 52  Temp(Src) 98.4 F (36.9 C) (Oral)  Resp 15  Ht 6\' 2"  (1.88 m)  Wt 249 lb (112.946 kg)  BMI 31.96 kg/m2  SpO2 99%   No pedal edema present  ASSESSMENT/PLAN:   Diabetes type 2, uncontrolled     His blood sugars appear to be relatively better on his home readings and his fasting glucose is normal However A1c is still 8% Now on triple therapy including 1.8 mg Victoza and 15 mg Actos He has gained a little weight possibly from starting Actos and better sugar control  Discussed his diet and meal planning and advised him to add more protein to each meal and if blood sugars are higher with cereal need to stop doing this  He does not want to see the dietitian. He thinks he'll be more active with starting exercise at the [facility] Also discussed timing of glucose monitoring and blood sugar targets and needs to do some readings after supper also  Hypertension: Blood pressure is more consistent now Since he has mild increase in urine microalbumin  would recommend adding an ARB drug such as 25 mg losartan.  Renal dysfunction: Creatinine still mildly abnormal Advised him to avoid ibuprofen and discussed analgesics with his PCP  Retinal exams: He is going to see his eye doctor in about 2 weeks and asked him to have the report sent to [place]  Counseling time on subjects discussed above is over 50% of today's 25 minute visit  Olive Motyka 08/30/2014, 8:38 AM   Note: This office note was prepared with Dragon voice recognition system technology. Any transcriptional errors that result from this process are unintentional.  Lab on 08/27/2014  Component Date Value Ref Range Status  . Hgb A1c MFr Bld 08/27/2014 8.0* 4.6 - 6.5 % Final   Glycemic Control Guidelines for People with Diabetes:Non Diabetic:  <6%Goal of Therapy: <7%Additional Action Suggested:  >8%   . Sodium 08/27/2014 135  135 - 145 mEq/L Final  . Potassium 08/27/2014 4.1  3.5 - 5.1 mEq/L Final  . Chloride 08/27/2014 102  96 - 112 mEq/L Final  . CO2 08/27/2014 27  19 - 32 mEq/L Final  . Glucose, Bld 08/27/2014 99  70 - 99 mg/dL Final  . BUN 16/10/960406/28/2016 31* 6 - 23 mg/dL Final  . Creatinine, Ser 08/27/2014 1.55* 0.40 - 1.50 mg/dL Final  . Calcium 54/09/811906/28/2016 9.4  8.4 - 10.5 mg/dL Final  . GFR 14/78/295606/28/2016 58.13* >60.00 mL/min Final

## 2014-09-11 ENCOUNTER — Other Ambulatory Visit: Payer: Self-pay | Admitting: *Deleted

## 2014-09-11 ENCOUNTER — Telehealth: Payer: Self-pay | Admitting: Endocrinology

## 2014-09-11 MED ORDER — VICTOZA 18 MG/3ML ~~LOC~~ SOPN: 1.8000 mg | PEN_INJECTOR | Freq: Every day | SUBCUTANEOUS | Status: DC

## 2014-09-11 NOTE — Telephone Encounter (Signed)
rx sent

## 2014-09-11 NOTE — Telephone Encounter (Signed)
Patient called and would like a refill on his medication   Rx: Victoza  Pharmacy: [facility]    Thank you

## 2014-09-24 NOTE — Progress Notes (Signed)
Cardiology Office Note   Date:  09/25/2014   ID:  [name] [name], DOB [birth year], MRN [id number]  PCP:  [name] Dopp, MD  Cardiologist:  Dr. Peter [name]     Chief Complaint  Patient presents with  . Heart Murmur  . Coronary Artery Disease     History of Present Illness: [name] [name] is a 64 y.o. married male with one child.  He is a retired Psychologist, occupational from [employer].  He has a hx of CAD, ischemic cardiomyopathy, diabetes, HTN, HL, CKD, PAF, bradycardia with history of Mobitz type II AV block (no beta blocker). Chronic kidney disease has precluded the use of ACE inhibitor. Patient suffered a non-STEMI 03/2012 demonstrating multivessel CAD with an occluded RCA with collaterals and high-grade stenosis at the ostium of first diagonal branch. There was also moderate (70%) ostial left circumflex lesion. Nuclear study demonstrated inferior and inferoseptal scar with minimal peri-infarct ischemia. Ejection fraction on echocardiogram was 45-50%. Medical therapy was recommended. Last seen by Dr. Swaziland 7/14.  Patient was seen by primary care in May for wellness exam. Apparently there was a newly recognized murmur and he is referred back for further evaluation.  He is overall doing well.  He works out at [employer] 3 days a week.  The patient denies chest pain, shortness of breath, syncope, orthopnea, PND or significant pedal edema.  No dizziness or near syncope.    Studies/Reports Reviewed Today:  Myoview 03/28/12 IMPRESSION: 1. Mild LV systolic dysfunction, EF 42% with inferior and inferoseptal hypokinesis. 2. Fixed medium-sized, severe basal to mid inferior and inferoseptal perfusion defect. This suggests prior infarction with minimal ischemia. 3. Intermediate risk study.  Echo 03/21/12 - mild LVH. EF 45% to 50%. There is akinesis of the inferior myocardium. Grade 1 diastolicdysfunction). - Mitral valve: Mild regurgitation.  LHC 03/21/12 LM: 30% distal left main. LAD: Mild wall  irregularities. There is a large diagonal with a 90-95% ostial lesion. LCx: The left circumflex gives rise to a single large marginal branch. There is a 70-80% ostial LCX lesion. RCA: The RCA is occluded proximally with evidence of recent thrombus. There are left to right collaterals to the distal RCA. Left ventriculography: Not performed. Final Conclusions:  1. 3 vessel obstructive CAD. Recent RCA occlusion. Recommendations: Would maximize medical therapy. Consider stress myoview on medical therapy to assess ischemic burden and symptoms. Ostial location of diagonal and LCX disease is not optimal for PCI. If significant symptoms or ischemia on medical therapy may need to consider CABG.   Past Medical History  Diagnosis Date  . Diabetes mellitus   . Obese   . Hypertension   . Coronary artery disease     s/p NSTEMI with multivessel CAD wit hoccluded RCA; myoview with inferior and inferoseptal infarct with minimal peri infarct ishemia - managed medically  . Myocardial infarction 03/2012  . Dysrhythmia 03/2012    bradycardia  . Heart murmur   . CKD (chronic kidney disease) stage 3, GFR 30-59 ml/min     CKD  . Neuromuscular disorder     TREMORS  . Arthritis   . Diabetes mellitus type 2 in obese   . PAF (paroxysmal atrial fibrillation) Jan 2014    Brief episode - no anticoagulation  . LV dysfunction Jan 2014    EF is 45 to 50% per echo    Past Surgical History  Procedure Laterality Date  . S/p knee surgery for torn ligament    . Rib fracture surgery    . Cardiac  catheterization  03/21/2012  . Left heart catheterization with coronary angiogram N/A 03/21/2012    Procedure: LEFT HEART CATHETERIZATION WITH CORONARY ANGIOGRAM;  Surgeon: [name] M Swaziland, MD;  Location: [facility] CATH LAB;  Service: Cardiovascular;  Laterality: N/A;     Current Outpatient Prescriptions  Medication Sig Dispense Refill  . amLODipine (NORVASC) 10 MG tablet Take 10 mg by mouth.    Marland Kitchen aspirin 81 MG tablet Take 81 mg  by mouth daily.    Marland Kitchen atorvastatin (LIPITOR) 80 MG tablet Take 1 tablet (80 mg total) by mouth daily at 6 PM. 30 tablet 6  . BAYER MICROLET LANCETS lancets Use as instructed to check blood sugar once daily dx code 250.42 100 each 1  . clopidogrel (PLAVIX) 75 MG tablet Take 1 tablet (75 mg total) by mouth daily. 30 tablet 6  . glipiZIDE (GLUCOTROL XL) 10 MG 24 hr tablet Take 10 mg by mouth daily with breakfast.    . glucose blood (ONETOUCH VERIO) test strip Use as instructed to check blood sugar once a day dx code E11.29 100 each 1  . isosorbide mononitrate (IMDUR) 60 MG 24 hr tablet Take 1 tablet (60 mg total) by mouth daily. 30 tablet 6  . Liraglutide (VICTOZA) 18 MG/3ML SOPN Inject 1.2 mg into the skin.    . Multiple Vitamin (MULTIVITAMIN WITH MINERALS) TABS Take 1 tablet by mouth daily.    . pioglitazone (ACTOS) 15 MG tablet Take 1 tablet (15 mg total) by mouth daily. 90 tablet 1  . VICTOZA 18 MG/3ML SOPN Inject 0.3 mLs (1.8 mg total) into the skin daily. Inject once daily at the same time 3 pen 3   No current facility-administered medications for this visit.    Allergies:   Metformin and related    Social History:  The patient  reports that he has never smoked. He has never used smokeless tobacco. He reports that he does not drink alcohol or use illicit drugs.   Family History:  The patient's family history includes Heart disease in his father.    ROS:   Please see the history of present illness.   Review of Systems  Cardiovascular: Positive for irregular heartbeat.  All other systems reviewed and are negative.     PHYSICAL EXAM: VS:  BP 160/78 mmHg  Pulse 69  Ht 6\' 2"  (1.88 m)  Wt 248 lb 12.8 oz (112.855 kg)  BMI 31.93 kg/m2    Wt Readings from Last 3 Encounters:  09/25/14 248 lb 12.8 oz (112.855 kg)  08/30/14 249 lb (112.946 kg)  07/18/14 244 lb 6.4 oz (110.859 kg)     GEN: Well nourished, well developed, in no acute distress HEENT: normal Neck: no JVD, no carotid  bruits, no masses Cardiac:  Normal S1/S2, RRR; 1-2/6 systolic murmur LLSB/apex,  no rubs or gallops, trace ankle edema   Respiratory:  clear to auscultation bilaterally, no wheezing, rhonchi or rales. GI: soft, nontender, nondistended, + BS MS: no deformity or atrophy Skin: warm and dry  Neuro:  CNs II-XII intact, Strength and sensation are intact Psych: Normal affect   EKG:  EKG is ordered today.  It demonstrates:   Sinus brady, HR 56, Mobitz 1, IVCD.   Recent Labs: 12/19/2013: ALT 30 08/27/2014: BUN 31*; Creatinine, Ser 1.55*; Potassium 4.1; Sodium 135    Lipid Panel    Component Value Date/Time   CHOL 111 12/19/2013 0851   TRIG 62.0 12/19/2013 0851   HDL 35.80* 12/19/2013 0851   CHOLHDL 3 12/19/2013  0851   VLDL 12.4 12/19/2013 0851   LDLCALC 63 12/19/2013 0851      ASSESSMENT AND PLAN:  Murmur:  His systolic murmur is consistent with mild mitral regurgitation noted on previous echocardiogram in 2014. It has been 2 years since his last echo.  I will arrange a follow-up echocardiogram.  Coronary artery disease involving native coronary artery of native heart without angina pectoris:  No angina. Continue amlodipine, aspirin, statin, Plavix, nitrates.  Cardiomyopathy, ischemic:  He is not on beta blocker secondary to bradycardia and prior history of Mobitz type II AV block. He was previously not on ACE inhibitor or ARB secondary to chronic kidney disease. I reviewed his creatinines over the past couple of years located in our chart as well as his primary care physician's chart via care everywhere. His creatinines have remained fairly stable (1.8-2.0). His endocrinologist has suggested over the last several visits to consider adding an ARB secondary to recent detection of proteinuria. Overall, the patient would benefit from the addition of an ACE inhibitor or ARB given his ischemic cardiomyopathy, chronic kidney disease and diabetic nephropathy. I will start losartan 25 mg daily.  Check a BMET today and repeat a BMET in 5 days. We will need to keep a close eye on his renal function.   Essential hypertension:  Fairly well controlled. He is off hydralazine for unknown reasons. As noted, I will place him on losartan.  Hyperlipidemia:  Managed by primary care. Recent LDL 65. Continue current dose of Lipitor.  PAF (paroxysmal atrial fibrillation):  Maintaining NSR. Episode was apparently brief and anticoagulation was not warranted in the past.  Mobitz type II atrioventricular block:  His ECG today demonstrates Mobitz type I. I reviewed this with Dr. Johney Frame who agreed. The patient is not having any symptoms of syncope or near syncope. I reviewed this with the patient and advised him to contact our office for immediate evaluation should he have syncope or near-syncope.  Chronic kidney disease, stage III (moderate):  As noted, creatinine has remained fairly stable. I will add an ARB and keep a close eye on his renal function and potassium.    Medication Changes: Current medicines are reviewed at length with the patient today.  Concerns regarding medicines are as outlined above.  The following changes have been made:   Discontinued Medications   HYDRALAZINE (APRESOLINE) 25 MG TABLET    Take 1 tablet (25 mg total) by mouth 3 (three) times daily.   Modified Medications   No medications on file   New Prescriptions   No medications on file    Labs/ tests ordered today include:   No orders of the defined types were placed in this encounter.     Disposition:   FU with Dr. Peter [name] 6 mos.    Signed, Brynda Rim, MHS 09/25/2014 10:04 AM    [facility] [address] [place], [place], [place]  [address] Phone: [phone]; Fax: [phone]

## 2014-09-25 ENCOUNTER — Ambulatory Visit (INDEPENDENT_AMBULATORY_CARE_PROVIDER_SITE_OTHER): Payer: Medicare HMO | Admitting: Physician Assistant

## 2014-09-25 ENCOUNTER — Encounter: Payer: Self-pay | Admitting: Physician Assistant

## 2014-09-25 ENCOUNTER — Telehealth: Payer: Self-pay | Admitting: *Deleted

## 2014-09-25 VITALS — BP 138/60 | HR 69 | Ht 74.0 in | Wt 248.8 lb

## 2014-09-25 DIAGNOSIS — E785 Hyperlipidemia, unspecified: Secondary | ICD-10-CM

## 2014-09-25 DIAGNOSIS — I255 Ischemic cardiomyopathy: Secondary | ICD-10-CM

## 2014-09-25 DIAGNOSIS — N183 Chronic kidney disease, stage 3 unspecified: Secondary | ICD-10-CM

## 2014-09-25 DIAGNOSIS — I480 Paroxysmal atrial fibrillation: Secondary | ICD-10-CM

## 2014-09-25 DIAGNOSIS — I340 Nonrheumatic mitral (valve) insufficiency: Secondary | ICD-10-CM

## 2014-09-25 DIAGNOSIS — I441 Atrioventricular block, second degree: Secondary | ICD-10-CM

## 2014-09-25 DIAGNOSIS — R011 Cardiac murmur, unspecified: Secondary | ICD-10-CM | POA: Diagnosis not present

## 2014-09-25 DIAGNOSIS — I10 Essential (primary) hypertension: Secondary | ICD-10-CM

## 2014-09-25 DIAGNOSIS — I2510 Atherosclerotic heart disease of native coronary artery without angina pectoris: Secondary | ICD-10-CM | POA: Diagnosis not present

## 2014-09-25 LAB — BASIC METABOLIC PANEL
BUN: 27 mg/dL — ABNORMAL HIGH (ref 6–23)
CALCIUM: 9.6 mg/dL (ref 8.4–10.5)
CHLORIDE: 105 meq/L (ref 96–112)
CO2: 27 mEq/L (ref 19–32)
Creatinine, Ser: 1.55 mg/dL — ABNORMAL HIGH (ref 0.40–1.50)
GFR: 58.12 mL/min — ABNORMAL LOW (ref >60.00)
Glucose, Bld: 104 mg/dL — ABNORMAL HIGH (ref 70–99)
POTASSIUM: 4 meq/L (ref 3.5–5.1)
SODIUM: 136 meq/L (ref 135–145)

## 2014-09-25 MED ORDER — LOSARTAN POTASSIUM 25 MG PO TABS: 25.0000 mg | ORAL_TABLET | Freq: Every day | ORAL | Status: DC

## 2014-09-25 NOTE — Patient Instructions (Signed)
Medication Instructions:  1. START LOSARTAN 25 MG DAILY  Labwork: 1. TODAY BMET  2. BMET ON Monday 09/30/14  Testing/Procedures: Your physician has requested that you have an echocardiogram. Echocardiography is a painless test that uses sound waves to create images of your heart. It provides your doctor with information about the size and shape of your heart and how well your heart's chambers and valves are working. This procedure takes approximately one hour. There are no restrictions for this procedure.   Follow-Up: Your physician recommends that you schedule a follow-up appointment in: 6 MONTHS WITH DR. Swaziland; WE WILL SEND OUT A REMINDER LETTER   Any Other Special Instructions Will Be Listed Below (If Applicable).

## 2014-09-25 NOTE — Telephone Encounter (Signed)
Pt notified of lab results by phone with verbal understanding; pt said to please let Bing Neighbors. PA know that he did pick up the Losaratan and will start tonight. BMET 8/1 when he comes in for echo.

## 2014-09-30 ENCOUNTER — Other Ambulatory Visit (INDEPENDENT_AMBULATORY_CARE_PROVIDER_SITE_OTHER): Payer: Medicare HMO | Admitting: *Deleted

## 2014-09-30 ENCOUNTER — Other Ambulatory Visit: Payer: Self-pay

## 2014-09-30 ENCOUNTER — Ambulatory Visit (HOSPITAL_COMMUNITY): Payer: Medicare HMO | Attending: Cardiovascular Disease

## 2014-09-30 ENCOUNTER — Encounter: Payer: Self-pay | Admitting: Physician Assistant

## 2014-09-30 DIAGNOSIS — E785 Hyperlipidemia, unspecified: Secondary | ICD-10-CM | POA: Diagnosis not present

## 2014-09-30 DIAGNOSIS — I2510 Atherosclerotic heart disease of native coronary artery without angina pectoris: Secondary | ICD-10-CM | POA: Diagnosis not present

## 2014-09-30 DIAGNOSIS — N183 Chronic kidney disease, stage 3 unspecified: Secondary | ICD-10-CM

## 2014-09-30 DIAGNOSIS — E669 Obesity, unspecified: Secondary | ICD-10-CM | POA: Insufficient documentation

## 2014-09-30 DIAGNOSIS — Z6831 Body mass index (BMI) 31.0-31.9, adult: Secondary | ICD-10-CM | POA: Insufficient documentation

## 2014-09-30 DIAGNOSIS — R011 Cardiac murmur, unspecified: Secondary | ICD-10-CM

## 2014-09-30 DIAGNOSIS — I10 Essential (primary) hypertension: Secondary | ICD-10-CM

## 2014-09-30 DIAGNOSIS — I517 Cardiomegaly: Secondary | ICD-10-CM | POA: Diagnosis not present

## 2014-09-30 DIAGNOSIS — I351 Nonrheumatic aortic (valve) insufficiency: Secondary | ICD-10-CM | POA: Diagnosis not present

## 2014-09-30 DIAGNOSIS — I340 Nonrheumatic mitral (valve) insufficiency: Secondary | ICD-10-CM | POA: Insufficient documentation

## 2014-09-30 DIAGNOSIS — E119 Type 2 diabetes mellitus without complications: Secondary | ICD-10-CM | POA: Insufficient documentation

## 2014-09-30 DIAGNOSIS — I255 Ischemic cardiomyopathy: Secondary | ICD-10-CM

## 2014-09-30 LAB — BASIC METABOLIC PANEL
BUN: 30 mg/dL — ABNORMAL HIGH (ref 6–23)
CHLORIDE: 106 meq/L (ref 96–112)
CO2: 25 mEq/L (ref 19–32)
Calcium: 9 mg/dL (ref 8.4–10.5)
Creatinine, Ser: 1.67 mg/dL — ABNORMAL HIGH (ref 0.40–1.50)
GFR: 53.32 mL/min — AB (ref >60.00)
Glucose, Bld: 83 mg/dL (ref 70–99)
Potassium: 3.8 mEq/L (ref 3.5–5.1)
SODIUM: 138 meq/L (ref 135–145)

## 2014-09-30 NOTE — Addendum Note (Signed)
Addended by: Tonita Phoenix on: 09/30/2014 09:36 AM   Modules accepted: Orders

## 2014-10-01 ENCOUNTER — Telehealth: Payer: Self-pay | Admitting: Physician Assistant

## 2014-10-01 DIAGNOSIS — I10 Essential (primary) hypertension: Secondary | ICD-10-CM

## 2014-10-01 NOTE — Telephone Encounter (Signed)
New message     Pt returning call about lab reports Pt states if you do not reach him it is okay to detailed message on voicemail Please call to discuss

## 2014-10-01 NOTE — Telephone Encounter (Signed)
Pt notified of lab and echo results by phone w/verbal understanding to plan ofc are, bmet 8/9. 

## 2014-10-01 NOTE — Telephone Encounter (Signed)
Pt notified of lab and echo results by phone w/verbal understanding to plan ofc are, bmet 8/9.

## 2014-10-07 ENCOUNTER — Other Ambulatory Visit (INDEPENDENT_AMBULATORY_CARE_PROVIDER_SITE_OTHER): Payer: Medicare HMO | Admitting: *Deleted

## 2014-10-07 DIAGNOSIS — I10 Essential (primary) hypertension: Secondary | ICD-10-CM | POA: Diagnosis not present

## 2014-10-07 LAB — BASIC METABOLIC PANEL
BUN: 33 mg/dL — ABNORMAL HIGH (ref 6–23)
CO2: 26 mEq/L (ref 19–32)
Calcium: 9.4 mg/dL (ref 8.4–10.5)
Chloride: 106 mEq/L (ref 96–112)
Creatinine, Ser: 2.06 mg/dL — ABNORMAL HIGH (ref 0.40–1.50)
GFR: 41.85 mL/min — AB (ref >60.00)
Glucose, Bld: 156 mg/dL — ABNORMAL HIGH (ref 70–99)
POTASSIUM: 4.2 meq/L (ref 3.5–5.1)
SODIUM: 139 meq/L (ref 135–145)

## 2014-10-08 ENCOUNTER — Other Ambulatory Visit: Payer: Medicare HMO

## 2014-10-10 ENCOUNTER — Telehealth: Payer: Self-pay | Admitting: *Deleted

## 2014-10-10 DIAGNOSIS — R7989 Other specified abnormal findings of blood chemistry: Secondary | ICD-10-CM

## 2014-10-10 NOTE — Telephone Encounter (Signed)
-----   Message from Beatrice Lecher, [place] sent at 10/07/2014  5:12 PM EDT ----- Creatinine increased from last check. I need to repeat this to see if we can continue the Losartan or not. Please have him repeat the BMET Wednesday 8/10. Tereso Newcomer, PA-C   10/07/2014 5:12 PM

## 2014-10-10 NOTE — Telephone Encounter (Signed)
Informed pt of lab results. Pt unable to come in today for labs. Scheduled pt to come in 8/12. Pt verbalized understanding and was in agreement with this plan.

## 2014-10-11 ENCOUNTER — Telehealth: Payer: Self-pay | Admitting: *Deleted

## 2014-10-11 ENCOUNTER — Other Ambulatory Visit (INDEPENDENT_AMBULATORY_CARE_PROVIDER_SITE_OTHER): Payer: Medicare HMO | Admitting: *Deleted

## 2014-10-11 DIAGNOSIS — N183 Chronic kidney disease, stage 3 unspecified: Secondary | ICD-10-CM

## 2014-10-11 DIAGNOSIS — R748 Abnormal levels of other serum enzymes: Secondary | ICD-10-CM

## 2014-10-11 DIAGNOSIS — I10 Essential (primary) hypertension: Secondary | ICD-10-CM

## 2014-10-11 DIAGNOSIS — R7989 Other specified abnormal findings of blood chemistry: Secondary | ICD-10-CM

## 2014-10-11 LAB — BASIC METABOLIC PANEL
BUN: 30 mg/dL — ABNORMAL HIGH (ref 6–23)
CALCIUM: 9.3 mg/dL (ref 8.4–10.5)
CO2: 28 mEq/L (ref 19–32)
Chloride: 106 mEq/L (ref 96–112)
Creatinine, Ser: 1.86 mg/dL — ABNORMAL HIGH (ref 0.40–1.50)
GFR: 47.08 mL/min — AB (ref >60.00)
Glucose, Bld: 142 mg/dL — ABNORMAL HIGH (ref 70–99)
POTASSIUM: 3.9 meq/L (ref 3.5–5.1)
SODIUM: 138 meq/L (ref 135–145)

## 2014-10-11 NOTE — Telephone Encounter (Signed)
S/w pt's wife Jasmine December due to pt sleeping before he has to go to work [employer]. I advised of lb results as well as 1 month for repeat bmet. She said she will have ptcb before 5 pm before he goes to work.

## 2014-10-11 NOTE — Telephone Encounter (Signed)
I was handed a walk in note late this evening looks like pt had come in this morning to discussa about losartan and that his feet are still swelling. I lmom tcb on Monday to s/w Triage nurse about feet swelling.

## 2014-10-30 ENCOUNTER — Other Ambulatory Visit (INDEPENDENT_AMBULATORY_CARE_PROVIDER_SITE_OTHER): Payer: Medicare HMO

## 2014-10-30 DIAGNOSIS — E785 Hyperlipidemia, unspecified: Secondary | ICD-10-CM | POA: Diagnosis not present

## 2014-10-30 DIAGNOSIS — E1165 Type 2 diabetes mellitus with hyperglycemia: Secondary | ICD-10-CM

## 2014-10-30 DIAGNOSIS — IMO0002 Reserved for concepts with insufficient information to code with codable children: Secondary | ICD-10-CM

## 2014-10-30 LAB — LIPID PANEL
Cholesterol: 80 mg/dL (ref 0–200)
HDL: 33.6 mg/dL — AB (ref >39.00)
LDL Cholesterol: 37 mg/dL (ref 0–99)
NONHDL: 46.86
TRIGLYCERIDES: 47 mg/dL (ref 0.0–149.0)
Total CHOL/HDL Ratio: 2
VLDL: 9.4 mg/dL (ref 0.0–40.0)

## 2014-10-30 LAB — BASIC METABOLIC PANEL
BUN: 31 mg/dL — ABNORMAL HIGH (ref 6–23)
CALCIUM: 9 mg/dL (ref 8.4–10.5)
CO2: 26 mEq/L (ref 19–32)
Chloride: 105 mEq/L (ref 96–112)
Creatinine, Ser: 1.67 mg/dL — ABNORMAL HIGH (ref 0.40–1.50)
GFR: 53.31 mL/min — AB (ref >60.00)
GLUCOSE: 122 mg/dL — AB (ref 70–99)
Potassium: 4.1 mEq/L (ref 3.5–5.1)
SODIUM: 136 meq/L (ref 135–145)

## 2014-10-31 ENCOUNTER — Other Ambulatory Visit: Payer: Medicare HMO

## 2014-10-31 LAB — FRUCTOSAMINE: FRUCTOSAMINE: 275 umol/L (ref 0–285)

## 2014-11-05 ENCOUNTER — Encounter: Payer: Self-pay | Admitting: Endocrinology

## 2014-11-05 ENCOUNTER — Ambulatory Visit (INDEPENDENT_AMBULATORY_CARE_PROVIDER_SITE_OTHER): Payer: Medicare HMO | Admitting: Endocrinology

## 2014-11-05 VITALS — BP 136/68 | HR 52 | Temp 98.0°F | Resp 16 | Ht 74.0 in | Wt 254.4 lb

## 2014-11-05 DIAGNOSIS — N183 Chronic kidney disease, stage 3 unspecified: Secondary | ICD-10-CM

## 2014-11-05 DIAGNOSIS — IMO0002 Reserved for concepts with insufficient information to code with codable children: Secondary | ICD-10-CM

## 2014-11-05 DIAGNOSIS — E1165 Type 2 diabetes mellitus with hyperglycemia: Secondary | ICD-10-CM

## 2014-11-05 DIAGNOSIS — E785 Hyperlipidemia, unspecified: Secondary | ICD-10-CM

## 2014-11-05 MED ORDER — FUROSEMIDE 20 MG PO TABS: 20.0000 mg | ORAL_TABLET | Freq: Every day | ORAL | Status: DC

## 2014-11-05 NOTE — Progress Notes (Signed)
Patient ID: [name] [name], male   DOB: 1950/07/31, 64 y.o.   MRN: [id number]    Reason for Appointment: Follow-up for Type 2 Diabetes  Referring physician: Providence Lanius  History of Present Illness:           Diagnosis: Type 2 diabetes mellitus, date of diagnosis: 1996       Past history: Metformin was used initially to control his diabetes but this was stopped because of reportedly having constipation Subsequently he had been on other oral hypoglycemic drugs and Januvia for the last 2-3 years Review of his outside records indicate that his A1c was 16.2% in January 2015 His A1c was high at 11.2% in 6/15 and he was referred here for further management.  Because of poor control with Januvia and glipizide he was instructed on starting Victoza in 08/2013 Subsequently Victoza was stopped because of lack of insurance coverage for this  Recent history:  He Victoza to 1.8 mg when his A1c was 8% Also started on Actos 15 mg daily and glipizide was continued Although his A1c was still 8% on the last visit his fructosamine is upper normal now  Current blood sugar patterns and management:  He is checking blood sugars mostly in the mornings again despite instructions to do more postprandial readings  He has mildly increased fasting readings but not consistently  Blood sugars later in the day are generally somewhat better but has only one reading after supper  No hypoglycemia with glipizide he does not appear to have as many high blood sugar spikes as on the last visit He thinks he is generally following his diet and he thinks he knows what to do with his diet but is generally not getting enough protein and may a lot of cereal especially in the morning and sometimes at bedtime.  Eating more salads but not always getting protein  Oral hypoglycemic drugs the patient is taking are:  Glipizide ER 10 mg, Actos 15 mg     Side effects from medications have been:  Constipation from metformin  Glucose  monitoring:  done 0.5 times a day       Glucometer: Currently One Touch Blood Glucose readings by download:  Mean values apply above for all meters except median for One Touch  PRE-MEAL  a.m.  Lunch Dinner Bedtime Overall  Glucose range:  117-167   110, 115    111    Mean/median:  139      130    POST-MEAL PC Breakfast PC Lunch PC Dinner  Glucose range:  120-157     Mean/median:        Mean values apply above for all meters except median for One Touch  PRE-MEAL Fasting  9 AM  Dinner Bedtime Overall  Glucose range: ?    84-160      Mean/median:     145   POST-MEAL PC Breakfast PC Lunch PC Dinner  Glucose range:  129-276   124-176    Mean/median:  146       Hypoglycemia: None      Glycemic control:  Previous A1c 11.2 in 6/15  Lab Results  Component Value Date   HGBA1C 8.0* 08/27/2014   HGBA1C 8.0* 04/23/2014   HGBA1C 8.2* 12/19/2013   Lab Results  Component Value Date   MICROALBUR 58.4* 01/29/2014   LDLCALC 37 10/30/2014   CREATININE 1.67* 10/30/2014    Self-care: The diet that the patient has been following is: tries to limit bread, eating more  salads and vegetables.     Dinner 7 pm         Exercise: Walks at times, active at work            [employer] visit: Most recent:2013.               Compliance with the medical regimen: Good Retinal exam: Most recent:4 yrs.    Weight history:  Wt Readings from Last 3 Encounters:  11/05/14 254 lb 6.4 oz (115.395 kg)  09/25/14 248 lb 12.8 oz (112.855 kg)  08/30/14 249 lb (112.946 kg)      Medication List       This list is accurate as of: 11/05/14 10:02 AM.  Always use your most recent med list.               amLODipine 10 MG tablet  Commonly known as:  NORVASC  Take 10 mg by mouth.     aspirin 81 MG tablet  Take 81 mg by mouth daily.     atorvastatin 80 MG tablet  Commonly known as:  LIPITOR  Take 1 tablet (80 mg total) by mouth daily at 6 PM.     BAYER MICROLET LANCETS lancets  Use as instructed to  check blood sugar once daily dx code 250.42     clopidogrel 75 MG tablet  Commonly known as:  PLAVIX  Take 1 tablet (75 mg total) by mouth daily.     furosemide 20 MG tablet  Commonly known as:  LASIX  Take 1 tablet (20 mg total) by mouth daily.     glipiZIDE 10 MG 24 hr tablet  Commonly known as:  GLUCOTROL XL  Take 10 mg by mouth daily with breakfast.     glucose blood test strip  Commonly known as:  ONETOUCH VERIO  Use as instructed to check blood sugar once a day dx code E11.29     isosorbide mononitrate 60 MG 24 hr tablet  Commonly known as:  IMDUR  Take 1 tablet (60 mg total) by mouth daily.     losartan 25 MG tablet  Commonly known as:  COZAAR  Take 1 tablet (25 mg total) by mouth daily.     multivitamin with minerals Tabs tablet  Take 1 tablet by mouth daily.     pioglitazone 15 MG tablet  Commonly known as:  ACTOS  Take 1 tablet (15 mg total) by mouth daily.     VICTOZA 18 MG/3ML Sopn  Generic drug:  Liraglutide  Inject 0.3 mLs (1.8 mg total) into the skin daily. Inject once daily at the same time        Allergies:  Allergies  Allergen Reactions  . Metformin And Related     Renal insufficiency    Past Medical History  Diagnosis Date  . Diabetes mellitus   . Obese   . Hypertension   . Coronary artery disease     s/p NSTEMI with multivessel CAD wit hoccluded RCA; myoview with inferior and inferoseptal infarct with minimal peri infarct ishemia - managed medically  . Myocardial infarction 03/2012  . Dysrhythmia 03/2012    bradycardia  . Heart murmur   . CKD (chronic kidney disease) stage 3, GFR 30-59 ml/min     CKD  . Neuromuscular disorder     TREMORS  . Arthritis   . Diabetes mellitus type 2 in obese   . PAF (paroxysmal atrial fibrillation) Jan 2014    Brief episode - no anticoagulation  . LV  dysfunction Jan 2014    EF is 45 to 50% per echo  . History of echocardiogram     Echo 8/16:  Mild LVH, EF 50-55%, trivial AI, mild MR, severe LAE, mild  RAE, PASP 37 mmHg    Past Surgical History  Procedure Laterality Date  . S/p knee surgery for torn ligament    . Rib fracture surgery    . Cardiac catheterization  03/21/2012  . Left heart catheterization with coronary angiogram N/A 03/21/2012    Procedure: LEFT HEART CATHETERIZATION WITH CORONARY ANGIOGRAM;  Surgeon: Peter M Swaziland, MD;  Location: [facility] CATH LAB;  Service: Cardiovascular;  Laterality: N/A;    Family History  Problem Relation Age of Onset  . Heart disease Father     Social History:  reports that he has never smoked. He has never used smokeless tobacco. He reports that he does not drink alcohol or use illicit drugs.    Review of Systems       Lipids: On treatment with Lipitor 80 mg  since 03/2012 with relatively low LDL values, tends to have low HDL.  Being followed by cardiologist      Lab Results  Component Value Date   CHOL 80 10/30/2014   HDL 33.60* 10/30/2014   LDLCALC 37 10/30/2014   TRIG 47.0 10/30/2014   CHOLHDL 2 10/30/2014   History of coronary artery disease, followed by cardiologist  Has history of chronic kidney disease of unknown etiology, relatively stable He periodically takes OTC ibuprofen, usually one at a time  He has had mild increase in microalbumin, losartan has been started in July but he did not get the prescription refilled last month His creatinine is somewhat better than before   Lab Results  Component Value Date   CREATININE 1.67* 10/30/2014                 Physical Examination:  BP 136/68 mmHg  Pulse 52  Temp(Src) 98 F (36.7 C)  Resp 16  Ht 6\' 2"  (1.88 m)  Wt 254 lb 6.4 oz (115.395 kg)  BMI 32.65 kg/m2  SpO2 99%   2+ pedal edema present  ASSESSMENT/PLAN:   Diabetes type 2, uncontrolled     His blood sugars appear to be fairly good with fructosamine indicating overall good control However he has not done any readings after meals as directed However A1c will need to be rechecked, this may be falsely high  Now  on triple therapy including 1.8 mg Victoza and 15 mg Actos He has gained a little weight possibly from recent edema  Although he has been fairly good with a diet he thinks he can start being more active with formal exercise once gets more time from work  Discussed his diet and meal planning and advised him to add  protein to each meal  Also discussed timing of glucose monitoring and blood sugar targets and needs to do some readings after supper also  EDEMA: This is probably a combination of amlodipine and Actos causing this Will have him start 20 mg of Lasix daily.  He can have follow-up electrolytes done when he sees his PCP or cardiologist  Hypertension: Blood pressure is controlled, added 25 mg losartan by cardiologist as suggested Since he has mild increase in urine microalbumin would recommend continuing losartan 25 mg  Renal dysfunction: Creatinine still mildly abnormal but appears to be relatively better; also had been advised to reduce ibuprofen use   Leng Montesdeoca 11/05/2014, 10:02 AM   Note: This  office note was prepared with Insurance underwriter. Any transcriptional errors that result from this process are unintentional.  Appointment on 10/30/2014  Component Date Value Ref Range Status  . Sodium 10/30/2014 136  135 - 145 mEq/L Final  . Potassium 10/30/2014 4.1  3.5 - 5.1 mEq/L Final  . Chloride 10/30/2014 105  96 - 112 mEq/L Final  . CO2 10/30/2014 26  19 - 32 mEq/L Final  . Glucose, Bld 10/30/2014 122* 70 - 99 mg/dL Final  . BUN 16/11/9602 31* 6 - 23 mg/dL Final  . Creatinine, Ser 10/30/2014 1.67* 0.40 - 1.50 mg/dL Final  . Calcium 54/10/8117 9.0  8.4 - 10.5 mg/dL Final  . GFR 14/78/2956 53.31* >60.00 mL/min Final  . Fructosamine 10/30/2014 275  0 - 285 umol/L Final   Comment: Published reference interval for apparently healthy subjects between age 58 and 25 is 61 - 285 umol/L and in a poorly controlled diabetic population is 228 - 563 umol/L with  a mean of 396 umol/L.   Marland Kitchen Cholesterol 10/30/2014 80  0 - 200 mg/dL Final   ATP III Classification       Desirable:  < 200 mg/dL               Borderline High:  200 - 239 mg/dL          High:  > = 213 mg/dL  . Triglycerides 10/30/2014 47.0  0.0 - 149.0 mg/dL Final   Normal:  <086 mg/dLBorderline High:  150 - 199 mg/dL  . HDL 10/30/2014 33.60* >39.00 mg/dL Final  . VLDL 57/84/6962 9.4  0.0 - 95.2 mg/dL Final  . LDL Cholesterol 10/30/2014 37  0 - 99 mg/dL Final  . Total CHOL/HDL Ratio 10/30/2014 2   Final                  Men          Women1/2 Average Risk     3.4          3.3Average Risk          5.0          4.42X Average Risk          9.6          7.13X Average Risk          15.0          11.0                      . NonHDL 10/30/2014 46.86   Final   NOTE:  Non-HDL goal should be 30 mg/dL higher than patient's LDL goal (i.e. LDL goal of < 70 mg/dL, would have non-HDL goal of < 100 mg/dL)

## 2014-11-05 NOTE — Patient Instructions (Signed)
Furosemide  daily  Check blood sugars on waking up .Marland Kitchen2-3  .Marland Kitchen times a week Also check blood sugars about 2 hours after a meal and do this after different meals by rotation  Recommended blood sugar levels on waking up is 90-130 and about 2 hours after meal is 140-180 Please bring blood sugar monitor to each visit.  Restart Losartan

## 2014-11-08 ENCOUNTER — Other Ambulatory Visit (INDEPENDENT_AMBULATORY_CARE_PROVIDER_SITE_OTHER): Payer: Medicare HMO | Admitting: *Deleted

## 2014-11-08 DIAGNOSIS — IMO0002 Reserved for concepts with insufficient information to code with codable children: Secondary | ICD-10-CM

## 2014-11-08 DIAGNOSIS — N183 Chronic kidney disease, stage 3 unspecified: Secondary | ICD-10-CM

## 2014-11-08 DIAGNOSIS — I10 Essential (primary) hypertension: Secondary | ICD-10-CM

## 2014-11-08 DIAGNOSIS — E1165 Type 2 diabetes mellitus with hyperglycemia: Secondary | ICD-10-CM | POA: Diagnosis not present

## 2014-11-08 LAB — COMPREHENSIVE METABOLIC PANEL
ALT: 33 U/L (ref 0–53)
AST: 21 U/L (ref 0–37)
Albumin: 3.7 g/dL (ref 3.5–5.2)
Alkaline Phosphatase: 60 U/L (ref 39–117)
BILIRUBIN TOTAL: 0.4 mg/dL (ref 0.2–1.2)
BUN: 42 mg/dL — AB (ref 6–23)
CO2: 28 meq/L (ref 19–32)
CREATININE: 1.85 mg/dL — AB (ref 0.40–1.50)
Calcium: 9.3 mg/dL (ref 8.4–10.5)
Chloride: 103 mEq/L (ref 96–112)
GFR: 47.37 mL/min — ABNORMAL LOW (ref >60.00)
GLUCOSE: 141 mg/dL — AB (ref 70–99)
Potassium: 4.4 mEq/L (ref 3.5–5.1)
Sodium: 135 mEq/L (ref 135–145)
Total Protein: 6.9 g/dL (ref 6.0–8.3)

## 2014-11-08 LAB — HEMOGLOBIN A1C: HEMOGLOBIN A1C: 7.1 % — AB (ref 4.6–6.5)

## 2014-11-08 NOTE — Addendum Note (Signed)
Addended by: Tonita Phoenix on: 11/08/2014 08:03 AM   Modules accepted: Orders

## 2014-11-11 NOTE — Progress Notes (Signed)
Quick Note:  Please let patient know that the A1c was better at 7.1  ______

## 2014-11-27 ENCOUNTER — Other Ambulatory Visit: Payer: Self-pay | Admitting: Endocrinology

## 2015-02-04 ENCOUNTER — Other Ambulatory Visit: Payer: Medicare HMO

## 2015-02-06 ENCOUNTER — Telehealth: Payer: Self-pay | Admitting: Endocrinology

## 2015-02-06 NOTE — Telephone Encounter (Signed)
Yes or assistance program

## 2015-02-06 NOTE — Telephone Encounter (Signed)
Patient states he is meeting a representative from his insurance company soon to find out what they will cover, he will call back to let us know.

## 2015-02-06 NOTE — Telephone Encounter (Signed)
Please see below, does he need to call his insurance company?

## 2015-02-06 NOTE — Telephone Encounter (Signed)
Patient called stating that he would like to know if there is anything cheaper that he can take for victoza When he is in the donut hole [name] cannot afford the 500.00 copay   Please advise    Thank you

## 2015-02-07 ENCOUNTER — Ambulatory Visit: Payer: Medicare HMO | Admitting: Endocrinology

## 2015-03-04 ENCOUNTER — Other Ambulatory Visit: Payer: Medicare HMO

## 2015-03-04 ENCOUNTER — Telehealth: Payer: Self-pay | Admitting: Endocrinology

## 2015-03-04 NOTE — Telephone Encounter (Signed)
Name: [name] [name] Gender: Male DOB: 08/09/1950 Age: [age] Y 8 M 1 D Return Phone Number: [phone] (Primary) Address: City/State/Zip: [place] Client [facility] Night - Client Client Site [facility] Physician Reather LittlerKumar, Ajay Contact Type Call Caller Name [name] [name] Caller Phone Number [phone] Relationship To Patient Self Is this call to report lab results? No Call Type General Information Initial Comment Caller states rec'd message has a lab tomorrow; wants to set for next week if possible; still out of town; adv to call office after 8am tomorrow; General Information Type Appointment Nurse Assessment Guidelines Guideline Title Affirmed Question Affirmed Notes Nurse Date

## 2015-03-11 ENCOUNTER — Ambulatory Visit: Payer: Medicare HMO | Admitting: Endocrinology

## 2015-03-20 ENCOUNTER — Other Ambulatory Visit: Payer: Self-pay | Admitting: *Deleted

## 2015-03-20 ENCOUNTER — Other Ambulatory Visit (INDEPENDENT_AMBULATORY_CARE_PROVIDER_SITE_OTHER): Payer: Medicare HMO

## 2015-03-20 DIAGNOSIS — E1129 Type 2 diabetes mellitus with other diabetic kidney complication: Secondary | ICD-10-CM

## 2015-03-20 LAB — COMPREHENSIVE METABOLIC PANEL
ALBUMIN: 3.7 g/dL (ref 3.5–5.2)
ALK PHOS: 71 U/L (ref 39–117)
ALT: 40 U/L (ref 0–53)
AST: 29 U/L (ref 0–37)
BUN: 37 mg/dL — ABNORMAL HIGH (ref 6–23)
CALCIUM: 9.4 mg/dL (ref 8.4–10.5)
CO2: 27 mEq/L (ref 19–32)
Chloride: 101 mEq/L (ref 96–112)
Creatinine, Ser: 1.87 mg/dL — ABNORMAL HIGH (ref 0.40–1.50)
GFR: 46.73 mL/min — AB (ref >60.00)
Glucose, Bld: 187 mg/dL — ABNORMAL HIGH (ref 70–99)
POTASSIUM: 4.2 meq/L (ref 3.5–5.1)
Sodium: 134 mEq/L — ABNORMAL LOW (ref 135–145)
TOTAL PROTEIN: 7 g/dL (ref 6.0–8.3)
Total Bilirubin: 0.4 mg/dL (ref 0.2–1.2)

## 2015-03-20 LAB — LIPID PANEL
CHOLESTEROL: 95 mg/dL (ref 0–200)
HDL: 34.3 mg/dL — AB (ref >39.00)
LDL Cholesterol: 49 mg/dL (ref 0–99)
NonHDL: 60.25
Total CHOL/HDL Ratio: 3
Triglycerides: 55 mg/dL (ref 0.0–149.0)
VLDL: 11 mg/dL (ref 0.0–40.0)

## 2015-03-20 LAB — HEMOGLOBIN A1C: Hgb A1c MFr Bld: 10.3 % — ABNORMAL HIGH (ref 4.6–6.5)

## 2015-03-25 ENCOUNTER — Encounter: Payer: Self-pay | Admitting: *Deleted

## 2015-03-25 ENCOUNTER — Ambulatory Visit: Payer: Medicare HMO | Admitting: Endocrinology

## 2015-03-25 NOTE — Progress Notes (Signed)
Quick Note:  Please let patient know that sugars are poorly controlled, needs follow-up ASAP ______

## 2015-04-10 ENCOUNTER — Other Ambulatory Visit: Payer: Self-pay | Admitting: Physician Assistant

## 2015-04-11 NOTE — Telephone Encounter (Signed)
Primary cardiologist is Dr. Peter Swaziland

## 2015-05-30 ENCOUNTER — Other Ambulatory Visit: Payer: Self-pay | Admitting: Endocrinology

## 2015-05-30 ENCOUNTER — Other Ambulatory Visit: Payer: Self-pay | Admitting: Cardiology

## 2015-05-30 NOTE — Telephone Encounter (Signed)
Rx(s) sent to pharmacy electronically.  

## 2015-06-10 ENCOUNTER — Other Ambulatory Visit: Payer: Self-pay | Admitting: Endocrinology

## 2015-06-16 ENCOUNTER — Other Ambulatory Visit: Payer: Self-pay | Admitting: *Deleted

## 2015-06-16 MED ORDER — LOSARTAN POTASSIUM 25 MG PO TABS: 25.0000 mg | ORAL_TABLET | Freq: Every day | ORAL | Status: DC

## 2015-07-17 ENCOUNTER — Other Ambulatory Visit: Payer: Self-pay | Admitting: Endocrinology

## 2015-11-12 ENCOUNTER — Other Ambulatory Visit: Payer: Self-pay | Admitting: Endocrinology

## 2015-12-10 ENCOUNTER — Other Ambulatory Visit: Payer: Self-pay | Admitting: Endocrinology

## 2016-01-20 ENCOUNTER — Encounter (INDEPENDENT_AMBULATORY_CARE_PROVIDER_SITE_OTHER): Payer: Self-pay

## 2016-01-20 ENCOUNTER — Ambulatory Visit (INDEPENDENT_AMBULATORY_CARE_PROVIDER_SITE_OTHER): Payer: Medicare HMO | Admitting: Physician Assistant

## 2016-01-20 ENCOUNTER — Encounter: Payer: Self-pay | Admitting: Physician Assistant

## 2016-01-20 VITALS — BP 150/60 | HR 42 | Ht 74.0 in | Wt 287.8 lb

## 2016-01-20 DIAGNOSIS — I255 Ischemic cardiomyopathy: Secondary | ICD-10-CM

## 2016-01-20 DIAGNOSIS — I2510 Atherosclerotic heart disease of native coronary artery without angina pectoris: Secondary | ICD-10-CM

## 2016-01-20 DIAGNOSIS — I441 Atrioventricular block, second degree: Secondary | ICD-10-CM | POA: Diagnosis not present

## 2016-01-20 DIAGNOSIS — I10 Essential (primary) hypertension: Secondary | ICD-10-CM | POA: Diagnosis not present

## 2016-01-20 DIAGNOSIS — D649 Anemia, unspecified: Secondary | ICD-10-CM | POA: Diagnosis not present

## 2016-01-20 DIAGNOSIS — I50810 Right heart failure, unspecified: Secondary | ICD-10-CM | POA: Diagnosis not present

## 2016-01-20 DIAGNOSIS — E7800 Pure hypercholesterolemia, unspecified: Secondary | ICD-10-CM

## 2016-01-20 DIAGNOSIS — N183 Chronic kidney disease, stage 3 unspecified: Secondary | ICD-10-CM

## 2016-01-20 LAB — BASIC METABOLIC PANEL
BUN: 37 mg/dL — ABNORMAL HIGH (ref 7–25)
CO2: 23 mmol/L (ref 20–31)
Calcium: 8.6 mg/dL (ref 8.6–10.3)
Chloride: 104 mmol/L (ref 98–110)
Creat: 2.04 mg/dL — ABNORMAL HIGH (ref 0.70–1.25)
GLUCOSE: 136 mg/dL — AB (ref 65–99)
POTASSIUM: 4.1 mmol/L (ref 3.5–5.3)
SODIUM: 136 mmol/L (ref 135–146)

## 2016-01-20 MED ORDER — FUROSEMIDE 40 MG PO TABS: 40.0000 mg | ORAL_TABLET | Freq: Two times a day (BID) | ORAL | 3 refills | Status: DC

## 2016-01-20 NOTE — Progress Notes (Signed)
Cardiology Office Note:    Date:  01/20/2016   ID:  [name] [name], DOB 01/01/1951, MRN [id number]  PCP:  Devra DoppHOWELL, TAMIEKA, MD  Cardiologist:  Dr. Peter SwazilandJordan   Electrophysiologist:  n/a  Referring MD: Devra DoppHowell, Tamieka, MD   Chief Complaint  Patient presents with  . Leg Swelling    History of Present Illness:    [name] HomesCarl Sturges is a 65 y.o. male with a hx of CAD, ischemic cardiomyopathy, diabetes, HTN, HL, CKD, PAF, bradycardia with history of Mobitz type II AV block (no beta blocker). Chronic kidney disease has precluded the use of ACE inhibitor. Patient suffered a non-STEMI 03/2012 demonstrating multivessel CAD with an occluded RCA with collaterals and high-grade stenosis at the ostium of first diagonal branch. There was also moderate (70%) ostial left circumflex lesion. Nuclear study demonstrated inferior and inferoseptal scar with minimal peri-infarct ischemia. Ejection fraction on echocardiogram was 45-50%. Medical therapy was recommended. Last seen in 7/16 by me.  He was recently seen by his PCP for leg edema.  Duplex was neg for DVT.  Labs 01/05/16 - Hgb 8.9 (7/17: 9.5), Creatinine 1.85 (9/17: 1.85), K 4.4, ALT 17, Alb 4.1, TSH 3.950.  He notes significant LE edema over the past several weeks. Since 9/16, he has gained 33 pounds. He notes that his leg edema has improved significantly since starting on Lasix 20 mg twice a day as prescribed by his primary care doctor. He denies significant dyspnea. He denies chest pain. He denies orthopnea or PND. He denies coughing or wheezing. He is not aware of his anemia. He denies melena or hematochezia. He had a cologard done earlier this year. This was apparently normal. He has not seen a gastroenterologist. He denies dizziness, weakness, syncope or near-syncope.  He tells me that several family members have had PPM implanted.   Prior CV studies that were reviewed today include:    Echo 09/30/14 Mild LVH, EF 50-55, trivial AI, mild MR, severe LAE, mild RAE,  PASP 37  Myoview 03/28/12 IMPRESSION: 1. Mild LV systolic dysfunction, EF 42% with inferior and inferoseptal hypokinesis. 2. Fixed medium-sized, severe basal to mid inferior and inferoseptal perfusion defect. This suggests prior infarction with minimal ischemia. 3. Intermediate risk study.  Echo 03/21/12 - mild LVH. EF 45% to 50%. There is akinesis of the inferior myocardium. Grade 1 diastolicdysfunction). - Mitral valve: Mild regurgitation.  LHC 03/21/12 LM: 30% distal left main. LAD: Mild wall irregularities. There is a large diagonal with a 90-95% ostial lesion. LCx: The left circumflex gives rise to a single large marginal branch. There is a 70-80% ostial LCX lesion. RCA: The RCA is occluded proximally with evidence of recent thrombus. There are left to right collaterals to the distal RCA. Left ventriculography: Not performed. Final Conclusions:  1. 3 vessel obstructive CAD. Recent RCA occlusion. Recommendations: Would maximize medical therapy. Consider stress myoview on medical therapy to assess ischemic burden and symptoms. Ostial location of diagonal and LCX disease is not optimal for PCI. If significant symptoms or ischemia on medical therapy may need to consider CABG.   Past Medical History:  Diagnosis Date  . Arthritis   . CKD (chronic kidney disease) stage 3, GFR 30-59 ml/min    CKD  . Coronary artery disease    s/p NSTEMI with multivessel CAD wit hoccluded RCA; myoview with inferior and inferoseptal infarct with minimal peri infarct ishemia - managed medically  . Diabetes mellitus   . Diabetes mellitus type 2 in obese (HCC)   . Dysrhythmia  03/2012   bradycardia  . Heart murmur   . History of echocardiogram    Echo 8/16:  Mild LVH, EF 50-55%, trivial AI, mild MR, severe LAE, mild RAE, PASP 37 mmHg  . Hypertension   . LV dysfunction Jan 2014   EF is 45 to 50% per echo  . Myocardial infarction 03/2012  . Neuromuscular disorder (HCC)    TREMORS  . Obese   . PAF  (paroxysmal atrial fibrillation) [facility]) Jan 2014   Brief episode - no anticoagulation    Past Surgical History:  Procedure Laterality Date  . CARDIAC CATHETERIZATION  03/21/2012  . LEFT HEART CATHETERIZATION WITH CORONARY ANGIOGRAM N/A 03/21/2012   Procedure: LEFT HEART CATHETERIZATION WITH CORONARY ANGIOGRAM;  Surgeon: Peter M Swaziland, MD;  Location: [facility] CATH LAB;  Service: Cardiovascular;  Laterality: N/A;  . RIB FRACTURE SURGERY    . s/p knee surgery for torn ligament      Current Medications: Current Meds  Medication Sig  . amLODipine (NORVASC) 10 MG tablet Take 10 mg by mouth.  Marland Kitchen atorvastatin (LIPITOR) 80 MG tablet Take 1 tablet (80 mg total) by mouth daily at 6 PM.  . BAYER MICROLET LANCETS lancets Use as instructed to check blood sugar once daily dx code 250.42  . clopidogrel (PLAVIX) 75 MG tablet Take 1 tablet (75 mg total) by mouth daily.  Marland Kitchen glimepiride (AMARYL) 1 MG tablet Take 1 mg by mouth daily with breakfast.   . glucose blood (ONETOUCH VERIO) test strip Use as instructed to check blood sugar once a day dx code E11.29  . isosorbide mononitrate (IMDUR) 60 MG 24 hr tablet Take 1 tablet (60 mg total) by mouth daily.  Marland Kitchen losartan (COZAAR) 25 MG tablet Take 1 tablet (25 mg total) by mouth daily. NEED OV.  . Multiple Vitamin (MULTIVITAMIN WITH MINERALS) TABS Take 1 tablet by mouth daily.  . [DISCONTINUED] aspirin 81 MG tablet Take 81 mg by mouth daily.  . [DISCONTINUED] furosemide (LASIX) 20 MG tablet Take 20 mg by mouth 2 (two) times daily.     Allergies:   Metformin and related   Social History   Social History  . Marital status: Married    Spouse name: N/A  . Number of children: N/A  . Years of education: N/A   Social History Main Topics  . Smoking status: Never Smoker  . Smokeless tobacco: Never Used  . Alcohol use No  . Drug use: No  . Sexual activity: No   Other Topics Concern  . None   Social History Narrative  . None     Family History:  The patient's  family history includes Heart disease in his father.   ROS:   Please see the history of present illness.    ROS All other systems reviewed and are negative.   EKGs/Labs/Other Test Reviewed:    EKG:  EKG is  ordered today.  The ekg ordered today demonstrates 2:1 AV block, HR 43, IVCD, QTc 393  Recent Labs: 03/20/2015: ALT 40; BUN 37; Creatinine, Ser 1.87; Potassium 4.2; Sodium 134   Recent Lipid Panel    Component Value Date/Time   CHOL 95 03/20/2015 0846   TRIG 55.0 03/20/2015 0846   HDL 34.30 (L) 03/20/2015 0846   CHOLHDL 3 03/20/2015 0846   VLDL 11.0 03/20/2015 0846   LDLCALC 49 03/20/2015 0846     Physical Exam:    VS:  BP (!) 150/60   Pulse (!) 42   Ht 6\' 2"  (1.88 m)  Wt 287 lb 12.8 oz (130.5 kg)   BMI 36.95 kg/m     Wt Readings from Last 3 Encounters:  01/20/16 287 lb 12.8 oz (130.5 kg)  11/05/14 254 lb 6.4 oz (115.4 kg)  09/25/14 248 lb 12.8 oz (112.9 kg)     Physical Exam  Constitutional: He is oriented to person, place, and time. He appears well-developed and well-nourished.  HENT:  Head: Normocephalic and atraumatic.  Eyes: No scleral icterus.  Neck: JVD present.  JVD to the angle of his jaw  Cardiovascular: Normal rate, regular rhythm and normal heart sounds.   No murmur heard. Pulmonary/Chest: Effort normal. He has no wheezes. He has no rales.  Abdominal: He exhibits distension. There is no tenderness.  Musculoskeletal: He exhibits edema.  Massive tight bilateral LE edema to above the knee  Neurological: He is alert and oriented to person, place, and time.  Skin: Skin is warm and dry.  Psychiatric: He has a normal mood and affect.    ASSESSMENT:    1. Right-sided heart failure   2. 2nd degree AV block   3. Coronary artery disease involving native coronary artery of native heart without angina pectoris   4. Cardiomyopathy, ischemic   5. Essential hypertension   6. Pure hypercholesterolemia   7. Anemia, unspecified type   8. Chronic kidney  disease, stage III (moderate)    PLAN:    In order of problems listed above:  1. R sided HF - He has massive LE edema and elevated JVP.  His lung are clear and he denies dyspnea.  His picture is more c/w R sided HF.  He has had some improvement with Lasix 20 bid.  However, he has CKD and will not likely have adequate response to such a low dose of diuretic.  I reviewed his case with Dr. Everette Rank (DOD) today.  Since he is not feeling poorly, we will continue to try to adjust his diuresis as an outpatient.  If we cannot make any improvement, he may need admission for IV Lasix.  Amlodipine may be making his edema worse.    -  Increase Lasix to 40 bid  -  BMET, BNP today  -  BMET in 1 week.  -  Get Echocardiogram  -  Consider decreasing/stopping Amlodipine over time.   2. 2:1 AV block - I reviewed his case with Dr. Lewayne Bunting (EP).  As noted the patient is not really symptomatic.  He denies fatigue, syncope, near syncope.  He may well end up needing a PPM.  Dr. Ladona Ridgel would like to see him in the office to evaluate further.  -  FU with Dr. Lewayne Bunting in 2 weeks.  3. CAD - Hx of non-STEMI 03/2012 demonstrating multivessel CAD with an occluded RCA with collaterals and high-grade stenosis at the ostium of first diagonal branch. There was also moderate (70%) ostial left circumflex lesion. Nuclear study demonstrated inferior and inferoseptal scar with minimal peri-infarct ischemia. Med Rx was recommended.  He is not having angina.  He has significant anemia.  D/w Dr. Everette Rank.  Will DC ASA.  Continue Plavix.  Check Echo.  Continue statin, nitrates.  He is not on beta-blocker 2/2 AV block.    4. Ischemic CM - EF was 45-50% previously.  Last echo in 8/16 with EF 50-55%.  He is not on beta-blocker due to AV block.  Continue ARB, nitrates.  5. HTN - Elevated today.  Adjust diuresis.  Continue other medications the same.  Continue to monitor.  6. HL - LDL in 1/17 was 49.  Continue statin.  7.  Anemia - MCV is normal and iron studies earlier this year were normal.  This may be related to chronic disease.  However, he should see GI to rule out bleeding.  Hold ASA as noted.  Refer to GI.    8. CKD - He has a nephrologist but has not seen him in a long time.  SCr is overall stable.  He will eventually need to get back and see his nephrologist.     Medication Adjustments/Labs and Tests Ordered: Current medicines are reviewed at length with the patient today.  Concerns regarding medicines are outlined above.  Medication changes, Labs and Tests ordered today are outlined in the Patient Instructions noted below. Patient Instructions  Medication Instructions:  1. STOP ASPIRIN 2. INCREASE LASIX TO 40 MG TWICE DAILY; NEW RX HAS BEEN SENT IN  Labwork: TODAY BMET, BNP BMET TO BE DONE IN 1 WEEK  Testing/Procedures: Your physician has requested that you have an echocardiogram. Echocardiography is a painless test that uses sound waves to create images of your heart. It provides your doctor with information about the size and shape of your heart and how well your heart's chambers and valves are working. This procedure takes approximately one hour. There are no restrictions for this procedure.  Follow-Up: DR. SwazilandJORDAN OR PA/NP AT THE [facility] OFFICE IN 2-3 WEEKS YOU ARE BEING REFERRED TO GASTROENTEROLOGY ;DX ANEMIA YOU WILL NEED TO SEE DR. Ladona RidgelAYLOR IN 2 WEEKS PER DR. Ladona RidgelAYLOR  Any Other Special Instructions Will Be Listed Below (If Applicable).  If you need a refill on your cardiac medications before your next appointment, please call your pharmacy.    Signed, Tereso NewcomerScott Deztinee Lohmeyer, PA-C  01/20/2016 5:20 PM    [facility] [address], [place], [place]  [id number] Phone: [phone]; Fax: [phone]

## 2016-01-20 NOTE — Patient Instructions (Addendum)
Medication Instructions:  1. STOP ASPIRIN 2. INCREASE LASIX TO 40 MG TWICE DAILY; NEW RX HAS BEEN SENT IN  Labwork: TODAY BMET, BNP BMET TO BE DONE IN 1 WEEK  Testing/Procedures: Your physician has requested that you have an echocardiogram. Echocardiography is a painless test that uses sound waves to create images of your heart. It provides your doctor with information about the size and shape of your heart and how well your heart's chambers and valves are working. This procedure takes approximately one hour. There are no restrictions for this procedure.  Follow-Up: DR. SwazilandJORDAN OR PA/NP AT THE [facility] OFFICE IN 2-3 WEEKS YOU ARE BEING REFERRED TO GASTROENTEROLOGY ;DX ANEMIA YOU WILL NEED TO SEE DR. Ladona RidgelAYLOR IN 2 WEEKS PER DR. Ladona RidgelAYLOR  Any Other Special Instructions Will Be Listed Below (If Applicable).  If you need a refill on your cardiac medications before your next appointment, please call your pharmacy.

## 2016-01-21 ENCOUNTER — Other Ambulatory Visit: Payer: Self-pay | Admitting: *Deleted

## 2016-01-21 ENCOUNTER — Encounter: Payer: Self-pay | Admitting: Gastroenterology

## 2016-01-21 ENCOUNTER — Telehealth: Payer: Self-pay | Admitting: *Deleted

## 2016-01-21 DIAGNOSIS — N183 Chronic kidney disease, stage 3 unspecified: Secondary | ICD-10-CM

## 2016-01-21 LAB — BRAIN NATRIURETIC PEPTIDE: BRAIN NATRIURETIC PEPTIDE: 723.3 pg/mL — AB (ref <100)

## 2016-01-21 MED ORDER — FUROSEMIDE 40 MG PO TABS: 40.0000 mg | ORAL_TABLET | ORAL | 3 refills | Status: DC

## 2016-01-21 MED ORDER — FUROSEMIDE 40 MG PO TABS: 40.0000 mg | ORAL_TABLET | Freq: Two times a day (BID) | ORAL | 3 refills | Status: DC

## 2016-01-21 NOTE — Telephone Encounter (Signed)
Labs taken to DOD Dr. Eden EmmsNishan.. Dr. Eden EmmsNishan reviewed labs. Based on Creatinine 2.04 11/21, BUN 37 11/21, BNP 723.3 11/21. Per Dr. Eden EmmsNishan recommendations to have pt only take the increased dose of lasix as stated yesterday OV note lasix 40 mg BID, to only take this dose for 2 days then decrease to lasix 40 mg AM/20 mg PM, lab work in 1-2 weeks. Pt scheduled for labs 11/29. Pt advised to f/u Nephrologist in 2-4 weeks per BurgawScott W. PA.

## 2016-01-21 NOTE — Telephone Encounter (Signed)
Pt has been notified to continue on the Lasix 40 mg BID as prescribed per Bing NeighborsScott W. PA at OV yesterday. Pt agreeable to lab work 11/27. Pt verbalized understanding to plan of care .

## 2016-01-21 NOTE — Telephone Encounter (Signed)
I lmtcb to go over lab results again now that Loews CorporationScott W. PA has reviewed. I left a detailed message on pt's phone to not follow the instructions we went over earlier today per Dr. Fabio BeringNishan's recommendations though to stay on the lasix dose of 40 mg BID that he prescribed yesterday at OV. Per Bing NeighborsScott W. PA he wants pt to come in Monday 11/27 instead of 11/29 for lab work, I also lmom.

## 2016-01-26 ENCOUNTER — Other Ambulatory Visit: Payer: Medicare HMO | Admitting: *Deleted

## 2016-01-26 ENCOUNTER — Telehealth: Payer: Self-pay | Admitting: *Deleted

## 2016-01-26 DIAGNOSIS — N183 Chronic kidney disease, stage 3 unspecified: Secondary | ICD-10-CM

## 2016-01-26 LAB — BASIC METABOLIC PANEL
BUN: 51 mg/dL — ABNORMAL HIGH (ref 7–25)
CHLORIDE: 104 mmol/L (ref 98–110)
CO2: 24 mmol/L (ref 20–31)
Calcium: 8.9 mg/dL (ref 8.6–10.3)
Creat: 2.25 mg/dL — ABNORMAL HIGH (ref 0.70–1.25)
Glucose, Bld: 132 mg/dL — ABNORMAL HIGH (ref 65–99)
POTASSIUM: 3.8 mmol/L (ref 3.5–5.3)
SODIUM: 136 mmol/L (ref 135–146)

## 2016-01-26 MED ORDER — FUROSEMIDE 40 MG PO TABS: 40.0000 mg | ORAL_TABLET | ORAL | Status: DC

## 2016-01-26 NOTE — Telephone Encounter (Signed)
Pt notified of lab results and findings by phone with verbal understanding to plan of care. BMET 12/1.

## 2016-01-26 NOTE — Addendum Note (Signed)
Addended by: Tarri FullerFIATO, Aracelli Woloszyn M on: 01/26/2016 05:53 PM   Modules accepted: Orders

## 2016-01-27 ENCOUNTER — Other Ambulatory Visit: Payer: Medicare HMO

## 2016-01-28 ENCOUNTER — Other Ambulatory Visit: Payer: Medicare HMO

## 2016-01-30 ENCOUNTER — Other Ambulatory Visit: Payer: Medicare HMO

## 2016-02-04 ENCOUNTER — Encounter: Payer: Self-pay | Admitting: Internal Medicine

## 2016-02-04 ENCOUNTER — Ambulatory Visit (INDEPENDENT_AMBULATORY_CARE_PROVIDER_SITE_OTHER): Payer: Medicare HMO | Admitting: Internal Medicine

## 2016-02-04 VITALS — BP 171/59 | HR 45 | Ht 74.0 in | Wt 299.0 lb

## 2016-02-04 DIAGNOSIS — I441 Atrioventricular block, second degree: Secondary | ICD-10-CM

## 2016-02-04 DIAGNOSIS — Z01812 Encounter for preprocedural laboratory examination: Secondary | ICD-10-CM | POA: Diagnosis not present

## 2016-02-04 NOTE — Progress Notes (Signed)
    HPI Mr. Maclaughlin is referred today for evaluation of 2:1 AV block. He is a 65 yo man with multiple medical problems who has had worsening peripheral edema for the past 3-4 months. The patient has not had syncope and he has mild LV dysfunction by echo.  He has LA enlargement. He has had severe peripheral edema and moderate dyspnea. No anginal symptoms His voice has been hoarse during this same time period.  Allergies  Allergen Reactions  . Metformin And Related     Renal insufficiency     Current Outpatient Prescriptions  Medication Sig Dispense Refill  . amLODipine (NORVASC) 10 MG tablet Take 10 mg by mouth.    . atorvastatin (LIPITOR) 80 MG tablet Take 1 tablet (80 mg total) by mouth daily at 6 PM. 30 tablet 6  . BAYER MICROLET LANCETS lancets Use as instructed to check blood sugar once daily dx code 250.42 100 each 1  . clopidogrel (PLAVIX) 75 MG tablet Take 1 tablet (75 mg total) by mouth daily. 30 tablet 6  . furosemide (LASIX) 40 MG tablet Take 40 mg by mouth every other day.    . glimepiride (AMARYL) 1 MG tablet Take 1 mg by mouth daily with breakfast.     . glucose blood (ONETOUCH VERIO) test strip Use as instructed to check blood sugar once a day dx code E11.29 100 each 1  . isosorbide mononitrate (IMDUR) 60 MG 24 hr tablet Take 1 tablet (60 mg total) by mouth daily. 30 tablet 6  . losartan (COZAAR) 25 MG tablet Take 1 tablet (25 mg total) by mouth daily. NEED OV. 90 tablet 0  . Multiple Vitamin (MULTIVITAMIN WITH MINERALS) TABS Take 1 tablet by mouth daily.     No current facility-administered medications for this visit.      Past Medical History:  Diagnosis Date  . Arthritis   . CKD (chronic kidney disease) stage 3, GFR 30-59 ml/min    CKD  . Coronary artery disease    s/p NSTEMI with multivessel CAD wit hoccluded RCA; myoview with inferior and inferoseptal infarct with minimal peri infarct ishemia - managed medically  . Diabetes mellitus   . Diabetes mellitus type  2 in obese (HCC)   . Dysrhythmia 03/2012   bradycardia  . Heart murmur   . History of echocardiogram    Echo 8/16:  Mild LVH, EF 50-55%, trivial AI, mild MR, severe LAE, mild RAE, PASP 37 mmHg  . Hypertension   . LV dysfunction Jan 2014   EF is 45 to 50% per echo  . Myocardial infarction 03/2012  . Neuromuscular disorder (HCC)    TREMORS  . Obese   . PAF (paroxysmal atrial fibrillation) (HCC) Jan 2014   Brief episode - no anticoagulation    ROS:   All systems reviewed and negative except as noted in the HPI.   Past Surgical History:  Procedure Laterality Date  . CARDIAC CATHETERIZATION  03/21/2012  . LEFT HEART CATHETERIZATION WITH CORONARY ANGIOGRAM N/A 03/21/2012   Procedure: LEFT HEART CATHETERIZATION WITH CORONARY ANGIOGRAM;  Surgeon: Peter M Jordan, MD;  Location: [facility] CATH LAB;  Service: Cardiovascular;  Laterality: N/A;  . RIB FRACTURE SURGERY    . s/p knee surgery for torn ligament       Family History  Problem Relation Age of Onset  . Heart disease Father      Social History   Social History  . Marital status: Married    Spouse name: N/A  .   Number of children: N/A  . Years of education: N/A   Occupational History  . Not on file.   Social History Main Topics  . Smoking status: Former Smoker    Packs/day: 0.25    Years: 6.00    Types: Cigarettes    Start date: 06/30/1966    Quit date: 06/29/1972  . Smokeless tobacco: Never Used  . Alcohol use No  . Drug use: No  . Sexual activity: No   Other Topics Concern  . Not on file   Social History Narrative  . No narrative on file     BP (!) 171/59   Pulse (!) 45   Ht 6\' 2"  (1.88 m)   Wt 299 lb (135.6 kg)   BMI 38.39 kg/m   Physical Exam:  obese appearing NAD HEENT: Unremarkable Neck:  No JVD, no thyromegally Lymphatics:  No adenopathy Back:  No CVA tenderness Lungs:  Clear with minimal basilar rales HEART:  Regular rate rhythm, no murmurs, no rubs, no clicks Abd:  soft, positive bowel sounds,  no organomegally, no rebound, no guarding Ext:  2 plus pulses, 2+peripheral edema, no cyanosis, no clubbing, left arm is mildly swollen compared to the right arm. Skin:  No rashes no nodules Neuro:  CN II through XII intact, motor grossly intact  EKG - nsr with 2:1 AV block   Assess/Plan: 1. 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 Will attempt HIS bundle pacing. 2. Hoarseness - the etiology is unclear. I am trying to find a unifying diagnosis. Will obtain a TSH and Free T4.  3. Left arm swelling - I might be inclined to place his PPM on the right side. Would consider he undergo left sided venography. 4. Chronic renal insufficiency - his renal function has worsened and he is now stage 4. I will check labs today and check LDH, CBC, TSH, and urine for Bence jones proteins.  Leonia ReevesGregg Taylor,M.D.

## 2016-02-04 NOTE — Patient Instructions (Addendum)
Medication Instructions:  Your physician recommends that you continue on your current medications as directed. Please refer to the Current Medication list given to you today.   Labwork: TOMORROW: TSH / Free T4  / BMET / CBC w/ diff / LDH / IFE Urine  Testing/Procedures: Your physician has recommended that you have a pacemaker inserted. A pacemaker is a small device that is placed under the skin of your chest or abdomen to help control abnormal heart rhythms. This device uses electrical pulses to prompt the heart to beat at a normal rate. Pacemakers are used to treat heart rhythms that are too slow. Wire (leads) are attached to the pacemaker that goes into the chambers of you heart. This is done in the hospital and usually requires and overnight stay. Please see the instruction sheet given to you today for more information.    Follow-Up: Follow-up for a wound check in the [facility] Clinic 10-14 days from 02/12/16 and with Dr. Ladona Ridgelaylor 91 days from  02/12/16.   Any Other Special Instructions Will Be Listed Below ---  Please wash with the CHG Soap the night before and morning of procedure   [facility] on  02/12/16 at 7:30 AM  Nothing to eat or drink after midnight the night before procedure  Do not take any medication the morning of procedure  Plan 1 night stay    If you need a refill on your cardiac medications before your next appointment, please call your pharmacy.

## 2016-02-05 ENCOUNTER — Other Ambulatory Visit: Payer: Medicare HMO | Admitting: *Deleted

## 2016-02-05 ENCOUNTER — Other Ambulatory Visit: Payer: Self-pay | Admitting: Internal Medicine

## 2016-02-05 DIAGNOSIS — N183 Chronic kidney disease, stage 3 unspecified: Secondary | ICD-10-CM

## 2016-02-05 DIAGNOSIS — Z01812 Encounter for preprocedural laboratory examination: Secondary | ICD-10-CM

## 2016-02-05 DIAGNOSIS — I441 Atrioventricular block, second degree: Secondary | ICD-10-CM

## 2016-02-05 LAB — CBC WITH DIFFERENTIAL/PLATELET
BASOS ABS: 0 {cells}/uL (ref 0–200)
BASOS PCT: 0 %
EOS PCT: 3 %
Eosinophils Absolute: 150 cells/uL (ref 15–500)
HCT: 25.8 % — ABNORMAL LOW (ref 38.5–50.0)
Hemoglobin: 8.2 g/dL — ABNORMAL LOW (ref 13.2–17.1)
Lymphocytes Relative: 16 %
Lymphs Abs: 800 cells/uL — ABNORMAL LOW (ref 850–3900)
MCH: 29.6 pg (ref 27.0–33.0)
MCHC: 31.8 g/dL — ABNORMAL LOW (ref 32.0–36.0)
MCV: 93.1 fL (ref 80.0–100.0)
MONOS PCT: 11 %
MPV: 11.2 fL (ref 7.5–12.5)
Monocytes Absolute: 550 cells/uL (ref 200–950)
NEUTROS ABS: 3500 {cells}/uL (ref 1500–7800)
Neutrophils Relative %: 70 %
PLATELETS: 201 10*3/uL (ref 140–400)
RBC: 2.77 MIL/uL — ABNORMAL LOW (ref 4.20–5.80)
RDW: 16.7 % — AB (ref 11.0–15.0)
WBC: 5 10*3/uL (ref 3.8–10.8)

## 2016-02-05 LAB — BASIC METABOLIC PANEL
BUN: 38 mg/dL — ABNORMAL HIGH (ref 7–25)
CO2: 23 mmol/L (ref 20–31)
Calcium: 8.7 mg/dL (ref 8.6–10.3)
Chloride: 107 mmol/L (ref 98–110)
Creat: 1.83 mg/dL — ABNORMAL HIGH (ref 0.70–1.25)
Glucose, Bld: 146 mg/dL — ABNORMAL HIGH (ref 65–99)
POTASSIUM: 4.1 mmol/L (ref 3.5–5.3)
SODIUM: 139 mmol/L (ref 135–146)

## 2016-02-05 LAB — LACTATE DEHYDROGENASE: LDH: 202 U/L (ref 120–250)

## 2016-02-05 LAB — T4, FREE: FREE T4: 1.3 ng/dL (ref 0.8–1.8)

## 2016-02-05 LAB — TSH: TSH: 3.39 m[IU]/L (ref 0.40–4.50)

## 2016-02-06 ENCOUNTER — Telehealth: Payer: Self-pay | Admitting: *Deleted

## 2016-02-06 ENCOUNTER — Ambulatory Visit: Payer: Medicare HMO | Admitting: Cardiology

## 2016-02-06 NOTE — Telephone Encounter (Signed)
Pt notified of lab results and findings by phone with verbal understanding. Pt asked for me to please send results to PCP Dr. Devra Doppamieka Howell.

## 2016-02-10 ENCOUNTER — Telehealth: Payer: Self-pay | Admitting: Internal Medicine

## 2016-02-10 LAB — PROTEIN, URINE, RANDOM: Total Protein, Urine: 145 mg/dL — ABNORMAL HIGH (ref 5–25)

## 2016-02-10 NOTE — Telephone Encounter (Signed)
New Message  Pt has heartcath procedure on 12.14.17 and had Echo scheduled for 12.15.17.  Pt's echo was rescheduled to 1.2.17 @ 3 pm due procedure with MD-Taylor on 12.14.17.  Can [facility] put [facility] for pt to have Echo at hospital on 12.14.17 with heartcath procedure?  Contacted echo department and was informed if cannot be done we'll leave appt as-in for 1.2.17, if can be done we'll need to cancel appt for 1.2.17 @ 3pm.  Please f/u

## 2016-02-11 LAB — IMMUNOFIXATION INTE

## 2016-02-12 ENCOUNTER — Ambulatory Visit (HOSPITAL_COMMUNITY)
Admission: RE | Admit: 2016-02-12 | Discharge: 2016-02-13 | Disposition: A | Discharge status: Home / Self Care | Payer: Medicare HMO | Source: Ambulatory Visit | Attending: Internal Medicine | Admitting: Internal Medicine

## 2016-02-12 ENCOUNTER — Encounter (HOSPITAL_COMMUNITY)
Admission: RE | Disposition: A | Discharge status: Home / Self Care | Payer: Self-pay | Source: Ambulatory Visit | Attending: Internal Medicine

## 2016-02-12 DIAGNOSIS — M1990 Unspecified osteoarthritis, unspecified site: Secondary | ICD-10-CM | POA: Diagnosis not present

## 2016-02-12 DIAGNOSIS — Z7902 Long term (current) use of antithrombotics/antiplatelets: Secondary | ICD-10-CM | POA: Diagnosis not present

## 2016-02-12 DIAGNOSIS — I130 Hypertensive heart and chronic kidney disease with heart failure and stage 1 through stage 4 chronic kidney disease, or unspecified chronic kidney disease: Secondary | ICD-10-CM | POA: Diagnosis not present

## 2016-02-12 DIAGNOSIS — I5022 Chronic systolic (congestive) heart failure: Secondary | ICD-10-CM | POA: Diagnosis not present

## 2016-02-12 DIAGNOSIS — Z8249 Family history of ischemic heart disease and other diseases of the circulatory system: Secondary | ICD-10-CM | POA: Diagnosis not present

## 2016-02-12 DIAGNOSIS — E1122 Type 2 diabetes mellitus with diabetic chronic kidney disease: Secondary | ICD-10-CM | POA: Diagnosis not present

## 2016-02-12 DIAGNOSIS — Z6838 Body mass index (BMI) 38.0-38.9, adult: Secondary | ICD-10-CM | POA: Insufficient documentation

## 2016-02-12 DIAGNOSIS — E669 Obesity, unspecified: Secondary | ICD-10-CM | POA: Diagnosis not present

## 2016-02-12 DIAGNOSIS — I252 Old myocardial infarction: Secondary | ICD-10-CM | POA: Insufficient documentation

## 2016-02-12 DIAGNOSIS — I2510 Atherosclerotic heart disease of native coronary artery without angina pectoris: Secondary | ICD-10-CM | POA: Insufficient documentation

## 2016-02-12 DIAGNOSIS — Z87891 Personal history of nicotine dependence: Secondary | ICD-10-CM | POA: Diagnosis not present

## 2016-02-12 DIAGNOSIS — I480 Paroxysmal atrial fibrillation: Secondary | ICD-10-CM | POA: Insufficient documentation

## 2016-02-12 DIAGNOSIS — N183 Chronic kidney disease, stage 3 (moderate): Secondary | ICD-10-CM | POA: Insufficient documentation

## 2016-02-12 DIAGNOSIS — I441 Atrioventricular block, second degree: Secondary | ICD-10-CM | POA: Diagnosis not present

## 2016-02-12 DIAGNOSIS — Z950 Presence of cardiac pacemaker: Secondary | ICD-10-CM

## 2016-02-12 HISTORY — PX: EP IMPLANTABLE DEVICE: SHX172B

## 2016-02-12 LAB — CBC
HCT: 25.1 % — ABNORMAL LOW (ref 39.0–52.0)
Hemoglobin: 8.2 g/dL — ABNORMAL LOW (ref 13.0–17.0)
MCH: 29.4 pg (ref 26.0–34.0)
MCHC: 32.7 g/dL (ref 30.0–36.0)
MCV: 90 fL (ref 78.0–100.0)
PLATELETS: 193 10*3/uL (ref 150–400)
RBC: 2.79 MIL/uL — ABNORMAL LOW (ref 4.22–5.81)
RDW: 17.3 % — AB (ref 11.5–15.5)
WBC: 5.7 10*3/uL (ref 4.0–10.5)

## 2016-02-12 LAB — SURGICAL PCR SCREEN
MRSA, PCR: NEGATIVE
Staphylococcus aureus: NEGATIVE

## 2016-02-12 LAB — GLUCOSE, CAPILLARY
GLUCOSE-CAPILLARY: 139 mg/dL — AB (ref 65–99)
GLUCOSE-CAPILLARY: 155 mg/dL — AB (ref 65–99)
Glucose-Capillary: 152 mg/dL — ABNORMAL HIGH (ref 65–99)
Glucose-Capillary: 135 mg/dL — ABNORMAL HIGH (ref 65–99)

## 2016-02-12 SURGERY — BIV PACEMAKER INSERTION CRT-P

## 2016-02-12 MED ORDER — MIDAZOLAM HCL 5 MG/5ML IJ SOLN
INTRAMUSCULAR | Status: AC
  Filled 2016-02-12: qty 5

## 2016-02-12 MED ORDER — HEPARIN (PORCINE) IN NACL 2-0.9 UNIT/ML-% IJ SOLN
INTRAMUSCULAR | Status: DC | PRN
  Administered 2016-02-12: 11:00:00

## 2016-02-12 MED ORDER — ATORVASTATIN CALCIUM 80 MG PO TABS
80.0000 mg | ORAL_TABLET | Freq: Every day | ORAL | Status: DC
  Administered 2016-02-12: 80 mg via ORAL
  Filled 2016-02-12: qty 1

## 2016-02-12 MED ORDER — SODIUM CHLORIDE 0.9 % IR SOLN
Status: DC | PRN
  Administered 2016-02-12: 450 mL

## 2016-02-12 MED ORDER — LIDOCAINE HCL (PF) 1 % IJ SOLN
INTRAMUSCULAR | Status: DC | PRN
  Administered 2016-02-12: 40 mL via INTRADERMAL

## 2016-02-12 MED ORDER — AMLODIPINE BESYLATE 10 MG PO TABS
10.0000 mg | ORAL_TABLET | Freq: Every day | ORAL | Status: DC
  Administered 2016-02-12 – 2016-02-13 (×2): 10 mg via ORAL
  Filled 2016-02-12 (×2): qty 1

## 2016-02-12 MED ORDER — VANCOMYCIN HCL IN DEXTROSE 1-5 GM/200ML-% IV SOLN
1000.0000 mg | Freq: Two times a day (BID) | INTRAVENOUS | Status: AC
  Administered 2016-02-12: 1000 mg via INTRAVENOUS
  Filled 2016-02-12: qty 200

## 2016-02-12 MED ORDER — FUROSEMIDE 40 MG PO TABS
40.0000 mg | ORAL_TABLET | ORAL | Status: DC
  Administered 2016-02-12: 40 mg via ORAL
  Filled 2016-02-12: qty 1

## 2016-02-12 MED ORDER — MUPIROCIN 2 % EX OINT
1.0000 "application " | TOPICAL_OINTMENT | Freq: Once | CUTANEOUS | Status: AC
  Administered 2016-02-12: 1 via TOPICAL

## 2016-02-12 MED ORDER — SODIUM CHLORIDE 0.9 % IV SOLN
INTRAVENOUS | Status: DC
  Administered 2016-02-12: 08:00:00 via INTRAVENOUS

## 2016-02-12 MED ORDER — DEXTROSE 5 % IV SOLN
3.0000 g | INTRAVENOUS | Status: AC
  Administered 2016-02-12: 3 g via INTRAVENOUS
  Filled 2016-02-12: qty 3000

## 2016-02-12 MED ORDER — ADULT MULTIVITAMIN W/MINERALS CH
1.0000 | ORAL_TABLET | Freq: Every day | ORAL | Status: DC
  Administered 2016-02-13: 1 via ORAL
  Filled 2016-02-12: qty 1

## 2016-02-12 MED ORDER — SODIUM CHLORIDE 0.9 % IR SOLN: 80.0000 mg | Status: DC

## 2016-02-12 MED ORDER — LIDOCAINE HCL (PF) 1 % IJ SOLN
INTRAMUSCULAR | Status: AC
  Filled 2016-02-12: qty 30

## 2016-02-12 MED ORDER — MENTHOL 3 MG MT LOZG
1.0000 | LOZENGE | OROMUCOSAL | Status: DC | PRN
  Administered 2016-02-12: 3 mg via ORAL
  Filled 2016-02-12: qty 9

## 2016-02-12 MED ORDER — LIDOCAINE HCL (PF) 1 % IJ SOLN
INTRAMUSCULAR | Status: AC
Start: 2016-02-12 — End: 2016-02-12
  Filled 2016-02-12: qty 30

## 2016-02-12 MED ORDER — HEPARIN (PORCINE) IN NACL 2-0.9 UNIT/ML-% IJ SOLN
INTRAMUSCULAR | Status: AC
  Filled 2016-02-12: qty 1000

## 2016-02-12 MED ORDER — LOSARTAN POTASSIUM 25 MG PO TABS
25.0000 mg | ORAL_TABLET | Freq: Every day | ORAL | Status: DC
  Administered 2016-02-12 – 2016-02-13 (×2): 25 mg via ORAL
  Filled 2016-02-12 (×2): qty 1

## 2016-02-12 MED ORDER — MUPIROCIN 2 % EX OINT
TOPICAL_OINTMENT | CUTANEOUS | Status: AC
Start: 2016-02-12 — End: 2016-02-12
  Administered 2016-02-12: 1 via TOPICAL
  Filled 2016-02-12: qty 22

## 2016-02-12 MED ORDER — IOPAMIDOL (ISOVUE-370) INJECTION 76%
INTRAVENOUS | Status: DC | PRN
  Administered 2016-02-12: 10 mL via INTRAVENOUS

## 2016-02-12 MED ORDER — ONDANSETRON HCL 4 MG/2ML IJ SOLN
4.0000 mg | Freq: Four times a day (QID) | INTRAMUSCULAR | Status: DC | PRN
Start: 2016-02-12 — End: 2016-02-13
  Administered 2016-02-13: 4 mg via INTRAVENOUS
  Filled 2016-02-12: qty 2

## 2016-02-12 MED ORDER — ACETAMINOPHEN 325 MG PO TABS
325.0000 mg | ORAL_TABLET | ORAL | Status: DC | PRN
  Administered 2016-02-12: 650 mg via ORAL
  Filled 2016-02-12: qty 2

## 2016-02-12 MED ORDER — FENTANYL CITRATE (PF) 100 MCG/2ML IJ SOLN
INTRAMUSCULAR | Status: AC
  Filled 2016-02-12: qty 2

## 2016-02-12 MED ORDER — ISOSORBIDE MONONITRATE ER 60 MG PO TB24
60.0000 mg | ORAL_TABLET | Freq: Every day | ORAL | Status: DC
  Administered 2016-02-12 – 2016-02-13 (×2): 60 mg via ORAL
  Filled 2016-02-12 (×2): qty 1

## 2016-02-12 MED ORDER — IOPAMIDOL (ISOVUE-370) INJECTION 76%
INTRAVENOUS | Status: AC
  Filled 2016-02-12: qty 50

## 2016-02-12 MED ORDER — FENTANYL CITRATE (PF) 100 MCG/2ML IJ SOLN
INTRAMUSCULAR | Status: DC | PRN
  Administered 2016-02-12 (×8): 12.5 ug via INTRAVENOUS

## 2016-02-12 MED ORDER — GLIMEPIRIDE 1 MG PO TABS
1.0000 mg | ORAL_TABLET | Freq: Every day | ORAL | Status: DC
  Administered 2016-02-13: 1 mg via ORAL
  Filled 2016-02-12: qty 1

## 2016-02-12 MED ORDER — SODIUM CHLORIDE 0.9 % IR SOLN
Status: AC
  Filled 2016-02-12: qty 2

## 2016-02-12 MED ORDER — MIDAZOLAM HCL 5 MG/5ML IJ SOLN
INTRAMUSCULAR | Status: DC | PRN
  Administered 2016-02-12 (×8): 1 mg via INTRAVENOUS

## 2016-02-12 MED ORDER — CHLORHEXIDINE GLUCONATE 4 % EX LIQD: 60.0000 mL | Freq: Once | CUTANEOUS | Status: DC

## 2016-02-12 SURGICAL SUPPLY — 23 items
ADAPTER SEALING SSA-EW-09 (MISCELLANEOUS) ×1 IMPLANT
ADPR INTRO LNG 9FR SL XTD WNG (MISCELLANEOUS) ×1
CABLE SURGICAL S-101-97-12 (CABLE) ×2 IMPLANT
CATH ATTAIN COM SURV 6250V-45S (CATHETERS) ×1 IMPLANT
CATH HEX JOS 2-5-2 65CM 6F REP (CATHETERS) ×1 IMPLANT
CATH RIGHTSITE C315HIS02 (CATHETERS) ×1 IMPLANT
DEVICE CRTP PERCEPTA QUAD MRI (Pacemaker) ×1 IMPLANT
LEAD ATTAIN PERFORMA S 4598-88 (Lead) ×1 IMPLANT
LEAD CAPSURE NOVUS 5076-52CM (Lead) ×1 IMPLANT
LEAD CAPSURE NOVUS 5076-58CM (Lead) ×1 IMPLANT
LEAD SELECT SECURE 3830 383069 (Lead) IMPLANT
PAD DEFIB LIFELINK (PAD) ×1 IMPLANT
SELECT SECURE 3830 383069 (Lead) IMPLANT
SHEATH CLASSIC 7F (SHEATH) ×2 IMPLANT
SHEATH CLASSIC 7F 25CM (SHEATH) ×2 IMPLANT
SHEATH CLASSIC 9.5F (SHEATH) ×1 IMPLANT
SLITTER 6232ADJ (MISCELLANEOUS) ×1 IMPLANT
TRAY PACEMAKER INSERTION (PACKS) ×1 IMPLANT
WIRE ACUITY WHISPER EDS 4648 (WIRE) ×1 IMPLANT
WIRE HI TORQ VERSACORE-J 145CM (WIRE) ×2 IMPLANT
WIRE LUGE 182CM (WIRE) ×1 IMPLANT
WIRE LUGE 300CM (WIRE) IMPLANT
WIRE MAILMAN 182CM (WIRE) ×1 IMPLANT

## 2016-02-12 NOTE — Interval H&P Note (Signed)
History and Physical Interval Note:  02/12/2016 10:15 AM  [name] [name]  has presented today for surgery, with the diagnosis of second degree heart block  The various methods of treatment have been discussed with the patient and family. After consideration of risks, benefits and other options for treatment, the patient has consented to  Procedure(s): Pacemaker Implant (N/A) as a surgical intervention .  The patient's history has been reviewed, patient examined, no change in status, stable for surgery.  I have reviewed the patient's chart and labs.  Questions were answered to the patient's satisfaction.     Lewayne BuntingGregg Marithza Malachi

## 2016-02-12 NOTE — H&P (View-Only) (Signed)
HPI [name] [name] is referred today for evaluation of 2:1 AV block. He is a 65 yo man with multiple medical problems who has had worsening peripheral edema for the past 3-4 months. The patient has not had syncope and he has mild LV dysfunction by echo.  He has LA enlargement. He has had severe peripheral edema and moderate dyspnea. No anginal symptoms His voice has been hoarse during this same time period.  Allergies  Allergen Reactions  . Metformin And Related     Renal insufficiency     Current Outpatient Prescriptions  Medication Sig Dispense Refill  . amLODipine (NORVASC) 10 MG tablet Take 10 mg by mouth.    Marland Kitchen. atorvastatin (LIPITOR) 80 MG tablet Take 1 tablet (80 mg total) by mouth daily at 6 PM. 30 tablet 6  . BAYER MICROLET LANCETS lancets Use as instructed to check blood sugar once daily dx code 250.42 100 each 1  . clopidogrel (PLAVIX) 75 MG tablet Take 1 tablet (75 mg total) by mouth daily. 30 tablet 6  . furosemide (LASIX) 40 MG tablet Take 40 mg by mouth every other day.    Marland Kitchen. glimepiride (AMARYL) 1 MG tablet Take 1 mg by mouth daily with breakfast.     . glucose blood (ONETOUCH VERIO) test strip Use as instructed to check blood sugar once a day dx code E11.29 100 each 1  . isosorbide mononitrate (IMDUR) 60 MG 24 hr tablet Take 1 tablet (60 mg total) by mouth daily. 30 tablet 6  . losartan (COZAAR) 25 MG tablet Take 1 tablet (25 mg total) by mouth daily. NEED OV. 90 tablet 0  . Multiple Vitamin (MULTIVITAMIN WITH MINERALS) TABS Take 1 tablet by mouth daily.     No current facility-administered medications for this visit.      Past Medical History:  Diagnosis Date  . Arthritis   . CKD (chronic kidney disease) stage 3, GFR 30-59 ml/min    CKD  . Coronary artery disease    s/p NSTEMI with multivessel CAD wit hoccluded RCA; myoview with inferior and inferoseptal infarct with minimal peri infarct ishemia - managed medically  . Diabetes mellitus   . Diabetes mellitus type  2 in obese (HCC)   . Dysrhythmia 03/2012   bradycardia  . Heart murmur   . History of echocardiogram    Echo 8/16:  Mild LVH, EF 50-55%, trivial AI, mild MR, severe LAE, mild RAE, PASP 37 mmHg  . Hypertension   . LV dysfunction Jan 2014   EF is 45 to 50% per echo  . Myocardial infarction 03/2012  . Neuromuscular disorder (HCC)    TREMORS  . Obese   . PAF (paroxysmal atrial fibrillation) (HCC) Jan 2014   Brief episode - no anticoagulation    ROS:   All systems reviewed and negative except as noted in the HPI.   Past Surgical History:  Procedure Laterality Date  . CARDIAC CATHETERIZATION  03/21/2012  . LEFT HEART CATHETERIZATION WITH CORONARY ANGIOGRAM N/A 03/21/2012   Procedure: LEFT HEART CATHETERIZATION WITH CORONARY ANGIOGRAM;  Surgeon: Peter M SwazilandJordan, MD;  [facility];  Service: Cardiovascular;  Laterality: N/A;  . RIB FRACTURE SURGERY    . s/p knee surgery for torn ligament       Family History  Problem Relation Age of Onset  . Heart disease Father      Social History   Social History  . Marital status: Married    Spouse name: N/A  .  Number of children: N/A  . Years of education: N/A   Occupational History  . Not on file.   Social History Main Topics  . Smoking status: Former Smoker    Packs/day: 0.25    Years: 6.00    Types: Cigarettes    Start date: 06/30/1966    Quit date: 06/29/1972  . Smokeless tobacco: Never Used  . Alcohol use No  . Drug use: No  . Sexual activity: No   Other Topics Concern  . Not on file   Social History Narrative  . No narrative on file     BP (!) 171/59   Pulse (!) 45   Ht 6\' 2"  (1.88 m)   Wt 299 lb (135.6 kg)   BMI 38.39 kg/m   Physical Exam:  obese appearing NAD HEENT: Unremarkable Neck:  No JVD, no thyromegally Lymphatics:  No adenopathy Back:  No CVA tenderness Lungs:  Clear with minimal basilar rales HEART:  Regular rate rhythm, no murmurs, no rubs, no clicks Abd:  soft, positive bowel sounds,  no organomegally, no rebound, no guarding Ext:  2 plus pulses, 2+peripheral edema, no cyanosis, no clubbing, left arm is mildly swollen compared to the right arm. Skin:  No rashes no nodules Neuro:  CN II through XII intact, motor grossly intact  EKG - nsr with 2:1 AV block   Assess/Plan: 1. 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 Will attempt HIS bundle pacing. 2. Hoarseness - the etiology is unclear. I am trying to find a unifying diagnosis. Will obtain a TSH and Free T4.  3. Left arm swelling - I might be inclined to place his PPM on the right side. Would consider he undergo left sided venography. 4. Chronic renal insufficiency - his renal function has worsened and he is now stage 4. I will check labs today and check LDH, CBC, TSH, and urine for Bence jones proteins.  Leonia ReevesGregg Taylor,M.D.

## 2016-02-12 NOTE — Discharge Summary (Signed)
ELECTROPHYSIOLOGY PROCEDURE DISCHARGE SUMMARY    Patient ID: [name] [name],  MRN: [id number], DOB/AGE: 05/11/1950 65 y.o.  Admit date: 02/12/2016 Discharge date: 02/13/16  Primary Care Physician: Devra DoppHOWELL, TAMIEKA, MD Primary Cardiologist: Dr. SwazilandJordan Electrophysiologist: Dr. Ladona Ridgelaylor  Primary Discharge Diagnosis:  1. 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 CAD 2. CRI (stage III) 3. DM 4. HTN  Allergies  Allergen Reactions  . Metformin And Related     Renal insufficiency     Procedures This Admission:  1.  Implantation of a CRT-P on 12/144/17 by Dr Ladona Ridgelaylor.  The patient received a Medtronic MRI compatible biventricular pacemaker, serial number [id number], Medtronic model 5076 active fixation pacing lead serial number [id number] G atrial lead, Medtronic model 5076, 58 centimeter active fixation pacing lead, serial number [id number] RV lead, left ventricular lead serial number was [id number], and the lead.  There were no immediate post procedure complications. 2.  CXR on 02/13/16 demonstrated no pneumothorax status post device implantation.   Brief HPI: [name] HomesCarl Laura is a 65 y.o. male was referred to electrophysiology in the outpatient setting for consideration of PPM implantation.  Past medical history includes 2:1 AV block, CAD, DM, HTN, CRI.  The patient had symptomatic bradycardia, 2:1 AVblock with no reversible causes identified.  Risks, benefits, and alternatives to PPM implantation were reviewed with the patient who wished to proceed.   Hospital Course:  The patient was admitted and underwent implantation of a CRT-P with details as outlined above. He was monitored on telemetry overnight which demonstrated SR, V paced.  Right chest was without hematoma or ecchymosis.  The device was interrogated and found to be functioning normally.  CXR was obtained and demonstrated no pneumothorax status post device implantation.  Wound care,  arm mobility, and restrictions were reviewed with the patient.  The patient was examined by Dr. Ladona Ridgelaylor and considered stable for discharge to home.  BP on higher side it appears chronically, will make no adjustments today with AV conduction may improve some, he has f/u with general cardiology next week.   Physical Exam: Vitals:   02/12/16 2000 02/12/16 2056 02/12/16 2126 02/13/16 0417  BP:  (!) 165/67  (!) 163/83  Pulse: 86  86 92  Resp: (!) 23  18 (!) 21  Temp:   99 F (37.2 C) 99 F (37.2 C)  TempSrc:   Oral Oral  SpO2: 100%  100% 96%  Weight:    291 lb (132 kg)  Height:        GEN- The patient is well appearing, alert and oriented x 3 today.   HEENT: normocephalic, atraumatic; sclera clear, conjunctiva pink; hearing intact; oropharynx clear Lungs- Clear to ausculation bilaterally, normal work of breathing.  No wheezes, rales, rhonchi Heart- Regular rate and rhythm, no murmurs, rubs or gallops, PMI not laterally displaced GI- soft, non-tender, non-distended Extremities- no clubbing, cyanosis, or edema MS- no significant deformity or atrophy Skin- warm and dry, no rash or lesion, right chest without hematoma/ecchymosis Psych- euthymic mood, full affect Neuro- no gross defecits  Labs:   Lab Results  Component Value Date   WBC 5.7 02/12/2016   HGB 8.2 (L) 02/12/2016   HCT 25.1 (L) 02/12/2016   MCV 90.0 02/12/2016   PLT 193 02/12/2016   No results for input(s): NA, K, CL, CO2, BUN, CREATININE, CALCIUM, PROT, BILITOT, ALKPHOS, ALT, AST, GLUCOSE in the last 168 hours.  Invalid input(s): LABALBU  Discharge Medications:  Allergies as of 02/13/2016      Reactions   Metformin And Related    Renal insufficiency      Medication List    TAKE these medications   amLODipine 10 MG tablet Commonly known as:  NORVASC Take 10 mg by mouth daily.   ASPIR-81 PO Take 1 tablet by mouth daily.   atorvastatin 80 MG tablet Commonly known as:  LIPITOR Take 1 tablet (80 mg total) by  mouth daily at 6 PM.   BAYER MICROLET LANCETS lancets Use as instructed to check blood sugar once daily dx code 250.42   clopidogrel 75 MG tablet Commonly known as:  PLAVIX Take 1 tablet (75 mg total) by mouth daily. What changed:  when to take this Notes to patient:  Continue to take this medicine as you have been instructed by the prescribing physician   furosemide 40 MG tablet Commonly known as:  LASIX Take 40 mg by mouth every other day.   glimepiride 1 MG tablet Commonly known as:  AMARYL Take 1 mg by mouth daily with breakfast.   glucose blood test strip Commonly known as:  ONETOUCH VERIO Use as instructed to check blood sugar once a day dx code E11.29   isosorbide mononitrate 60 MG 24 hr tablet Commonly known as:  IMDUR Take 1 tablet (60 mg total) by mouth daily.   losartan 25 MG tablet Commonly known as:  COZAAR Take 1 tablet (25 mg total) by mouth daily. NEED OV.   multivitamin with minerals Tabs tablet Take 1 tablet by mouth daily.       Disposition:  Home Discharge Instructions    Diet - low sodium heart healthy    Complete by:  As directed    Increase activity slowly    Complete by:  As directed      Follow-up Information    [facility] [address] [facility] Follow up on 02/25/2016.   Specialty:  Cardiology Why:  9:30AM, wound check Contact information: [address], [address] [place] [id number] [phone]       Lewayne BuntingGregg Mckenna Boruff, MD Follow up on 05/19/2016.   Specialty:  Cardiology Why:  9:15AM Contact information: [address] [place] [address] [phone]        Azalee CourseHao Meng, [place] Follow up on 02/16/2016.   Specialties:  Cardiology, Radiology Why:  2:30PM Contact information: [address] [place] [address] [phone]           Duration of Discharge Encounter: Greater than 30 minutes including physician time.  Norma FredricksonSigned, Renee Ursuy, PA-C 02/13/2016 9:06 AM  EP  Attending  Patient seen and examined. Agree with above. His device interrogation demonstrates normal function. He is stable for DC home. Usual followup.   Lewayne BuntingGregg Hai Grabe, M.D.

## 2016-02-12 NOTE — Discharge Instructions (Signed)
° ° °  Supplemental Discharge Instructions for  °Pacemaker/Defibrillator Patients ° °Activity °No heavy lifting or vigorous activity with your left/right arm for 6 to 8 weeks.  Do not raise your left/right arm above your head for one week.  Gradually raise your affected arm as drawn below. ° °        °  02/16/16                    02/17/16                  02/18/16                02/19/16 °__ ° °NO DRIVING for 1 week    ; you may begin driving on  02/19/16   . ° °WOUND CARE °- Keep the wound area clean and dry.  Do not get this area wet for one week. No showers for one week; you may shower on 02/19/16    . °- The tape/steri-strips on your wound will fall off; do not pull them off.  No bandage is needed on the site.  DO  NOT apply any creams, oils, or ointments to the wound area. °- If you notice any drainage or discharge from the wound, any swelling or bruising at the site, or you develop a fever > 101? F after you are discharged home, call the office at once. ° °Special Instructions °- You are still able to use cellular telephones; use the ear opposite the side where you have your pacemaker/defibrillator.  Avoid carrying your cellular phone near your device. °- When traveling through airports, show security personnel your identification card to avoid being screened in the metal detectors.  Ask the security personnel to use the hand wand. °- Avoid arc welding equipment, MRI testing (magnetic resonance imaging), TENS units (transcutaneous nerve stimulators).  Call the office for questions about other devices. °- Avoid electrical appliances that are in poor condition or are not properly grounded. °- Microwave ovens are safe to be near or to operate. ° °Additional information for defibrillator patients should your device go off: °- If your device goes off ONCE and you feel fine afterward, notify the device clinic nurses. °- If your device goes off ONCE and you do not feel well afterward, call 911. °- If your device goes  off TWICE, call 911. °- If your device goes off THREE times in one day, call 911. ° °DO NOT DRIVE YOURSELF OR A FAMILY MEMBER °WITH A DEFIBRILLATOR TO THE HOSPITAL--CALL 911. ° °

## 2016-02-13 ENCOUNTER — Other Ambulatory Visit (HOSPITAL_COMMUNITY): Payer: Medicare HMO

## 2016-02-13 ENCOUNTER — Encounter (HOSPITAL_COMMUNITY): Payer: Self-pay | Admitting: Internal Medicine

## 2016-02-13 ENCOUNTER — Ambulatory Visit (HOSPITAL_COMMUNITY): Payer: Medicare HMO

## 2016-02-13 DIAGNOSIS — I130 Hypertensive heart and chronic kidney disease with heart failure and stage 1 through stage 4 chronic kidney disease, or unspecified chronic kidney disease: Secondary | ICD-10-CM | POA: Diagnosis not present

## 2016-02-13 DIAGNOSIS — I441 Atrioventricular block, second degree: Secondary | ICD-10-CM

## 2016-02-13 DIAGNOSIS — N183 Chronic kidney disease, stage 3 (moderate): Secondary | ICD-10-CM | POA: Diagnosis not present

## 2016-02-13 DIAGNOSIS — E1122 Type 2 diabetes mellitus with diabetic chronic kidney disease: Secondary | ICD-10-CM | POA: Diagnosis not present

## 2016-02-13 DIAGNOSIS — I5022 Chronic systolic (congestive) heart failure: Secondary | ICD-10-CM | POA: Diagnosis not present

## 2016-02-13 LAB — GLUCOSE, CAPILLARY: Glucose-Capillary: 148 mg/dL — ABNORMAL HIGH (ref 65–99)

## 2016-02-13 NOTE — Progress Notes (Signed)
The client has been given his discharge instructions along with his education about his implantation care. All questions have been answered for the client and his wife. His sweater has been put on without affecting the procedure arm and the wife and son have had teaching on how to remove this and the importance of not lifting that procedure arm. A medication list has been given with meds listed that should be taken today.   Sheppard Evensina Woodrow Dulski RN

## 2016-02-16 ENCOUNTER — Ambulatory Visit (INDEPENDENT_AMBULATORY_CARE_PROVIDER_SITE_OTHER): Payer: Medicare HMO | Admitting: Physician Assistant

## 2016-02-16 ENCOUNTER — Encounter: Payer: Self-pay | Admitting: Physician Assistant

## 2016-02-16 VITALS — BP 107/94 | HR 98 | Ht 74.0 in | Wt 283.0 lb

## 2016-02-16 DIAGNOSIS — E785 Hyperlipidemia, unspecified: Secondary | ICD-10-CM | POA: Diagnosis not present

## 2016-02-16 DIAGNOSIS — E118 Type 2 diabetes mellitus with unspecified complications: Secondary | ICD-10-CM

## 2016-02-16 DIAGNOSIS — N183 Chronic kidney disease, stage 3 unspecified: Secondary | ICD-10-CM

## 2016-02-16 DIAGNOSIS — I2510 Atherosclerotic heart disease of native coronary artery without angina pectoris: Secondary | ICD-10-CM | POA: Diagnosis not present

## 2016-02-16 DIAGNOSIS — I10 Essential (primary) hypertension: Secondary | ICD-10-CM

## 2016-02-16 DIAGNOSIS — Z950 Presence of cardiac pacemaker: Secondary | ICD-10-CM

## 2016-02-16 DIAGNOSIS — I5033 Acute on chronic diastolic (congestive) heart failure: Secondary | ICD-10-CM | POA: Diagnosis not present

## 2016-02-16 LAB — CBC
HEMATOCRIT: 25.5 % — AB (ref 38.5–50.0)
HEMOGLOBIN: 8.1 g/dL — AB (ref 13.2–17.1)
MCH: 29.6 pg (ref 27.0–33.0)
MCHC: 31.8 g/dL — AB (ref 32.0–36.0)
MCV: 93.1 fL (ref 80.0–100.0)
MPV: 10.4 fL (ref 7.5–12.5)
Platelets: 209 10*3/uL (ref 140–400)
RBC: 2.74 MIL/uL — AB (ref 4.20–5.80)
RDW: 16.4 % — AB (ref 11.0–15.0)
WBC: 4.3 10*3/uL (ref 3.8–10.8)

## 2016-02-16 NOTE — Progress Notes (Signed)
Cardiology Office Note    Date:  02/16/2016   ID:  [name] [name], DOB 17-Dec-1950, MRN [id number]  PCP:  Devra Dopp, MD  Cardiologist:  Dr. Swaziland EP: Dr. Ladona Ridgel  Chief Complaint  Patient presents with  . Follow-up    seen for Dr. Swaziland    History of Present Illness:  [name] [name] is a 65 y.o. male with PMH of CAD, ICD, HTN, HLD, DM, CKD, PAF, and bradycardia with h/o mobitz II AV block. He is not on ACE inhibitor due to renal issue. He has suffered a NSTEMI in January 2014 demonstrating multivessel CAD with occluded RCA with collaterals and a high-grade stenosis at the ostium of the first diagonal branch. There was also moderate 70% ostial left circumflex lesion. Medical therapy was recommended along with subsequent Myoview for assessment of ischemic burden. Nuclear study demonstrating inferior and inferoseptal scar with minimal peri-infarct ischemia. Ejection fraction on echocardiogram was 45-50%. He had a repeat echocardiogram in August 2016 that showed EF 50-55%, severe left atrial enlargement, PASP 37 mmHg. He was last seen on 01/20/2016, at which time he appears to have massive lower extremity edema and elevated JVP consistent with right-sided heart failure. His Lasix was increased to 40 mg twice a day.  He was seen by Dr. Ladona Ridgel in the office on 02/04/2016, due to symptomatic 2:1 AV block, pacemaker was recommended with attempt of His bundle pacing. He eventually underwent successful Medtronic biventricular CRT-P insertion on 02/12/2016. Due to recent heart failure symptom, he has upcoming echocardiogram ordered for early January. He presents today for cardiology office visit. He has been feeling well since pacemaker placement. He has lost an additional 10 pounds since her pacemaker placement. He denies any recent fever or chill. Incision site looks good without any drainage or redness. He has since cut back his Lasix to 40 mg daily instead. He still has at least 1-2+ pitting edema in  bilateral lower extremity. He denies any significant shortness of breath. He denies any recent chest discomfort. Also noted recently that his hemoglobin is quite low around 8.2. This is down by 3 units since 3 years ago. He has upcoming GI workup however lab work shows it appears to be macrocytic anemia. I have advised him to follow-up with his PCP.   Past Medical History:  Diagnosis Date  . Arthritis   . CKD (chronic kidney disease) stage 3, GFR 30-59 ml/min    CKD  . Coronary artery disease    s/p NSTEMI with multivessel CAD wit hoccluded RCA; myoview with inferior and inferoseptal infarct with minimal peri infarct ishemia - managed medically  . Diabetes mellitus   . Diabetes mellitus type 2 in obese (HCC)   . Dysrhythmia 03/2012   bradycardia  . Heart murmur   . History of echocardiogram    Echo 8/16:  Mild LVH, EF 50-55%, trivial AI, mild MR, severe LAE, mild RAE, PASP 37 mmHg  . Hypertension   . LV dysfunction Jan 2014   EF is 45 to 50% per echo  . Myocardial infarction 03/2012  . Neuromuscular disorder (HCC)    TREMORS  . Obese   . PAF (paroxysmal atrial fibrillation) [facility]) Jan 2014   Brief episode - no anticoagulation    Past Surgical History:  Procedure Laterality Date  . CARDIAC CATHETERIZATION  03/21/2012  . EP IMPLANTABLE DEVICE N/A 02/12/2016   Procedure: BiV Pacemaker Insertion CRT-P;  Surgeon: Marinus Maw, MD;  Location: [facility] INVASIVE CV LAB;  Service: Cardiovascular;  Laterality: N/A;  . LEFT HEART CATHETERIZATION WITH CORONARY ANGIOGRAM N/A 03/21/2012   Procedure: LEFT HEART CATHETERIZATION WITH CORONARY ANGIOGRAM;  Surgeon: Peter M SwazilandJordan, MD;  [facility];  Service: Cardiovascular;  Laterality: N/A;  . RIB FRACTURE SURGERY    . s/p knee surgery for torn ligament      Current Medications: Outpatient Medications Prior to Visit  Medication Sig Dispense Refill  . amLODipine (NORVASC) 10 MG tablet Take 10 mg by mouth daily.     Marland Kitchen. atorvastatin (LIPITOR)  80 MG tablet Take 1 tablet (80 mg total) by mouth daily at 6 PM. 30 tablet 6  . BAYER MICROLET LANCETS lancets Use as instructed to check blood sugar once daily dx code 250.42 100 each 1  . clopidogrel (PLAVIX) 75 MG tablet Take 1 tablet (75 mg total) by mouth daily. (Patient taking differently: Take 75 mg by mouth every other day. ) 30 tablet 6  . furosemide (LASIX) 40 MG tablet Take 40 mg by mouth every other day.    Marland Kitchen. glimepiride (AMARYL) 1 MG tablet Take 1 mg by mouth daily with breakfast.     . glucose blood (ONETOUCH VERIO) test strip Use as instructed to check blood sugar once a day dx code E11.29 100 each 1  . isosorbide mononitrate (IMDUR) 60 MG 24 hr tablet Take 1 tablet (60 mg total) by mouth daily. 30 tablet 6  . losartan (COZAAR) 25 MG tablet Take 1 tablet (25 mg total) by mouth daily. NEED OV. 90 tablet 0  . Multiple Vitamin (MULTIVITAMIN WITH MINERALS) TABS Take 1 tablet by mouth daily.    . Aspirin (ASPIR-81 PO) Take 1 tablet by mouth daily.     No facility-administered medications prior to visit.      Allergies:   Metformin and related   Social History   Social History  . Marital status: Married    Spouse name: N/A  . Number of children: N/A  . Years of education: N/A   Social History Main Topics  . Smoking status: Former Smoker    Packs/day: 0.25    Years: 6.00    Types: Cigarettes    Start date: 06/30/1966    Quit date: 06/29/1972  . Smokeless tobacco: Never Used  . Alcohol use No  . Drug use: No  . Sexual activity: No   Other Topics Concern  . None   Social History Narrative  . None     Family History:  The patient's family history includes Heart disease in his father.   ROS:   Please see the history of present illness.    ROS All other systems reviewed and are negative.   PHYSICAL EXAM:   VS:  BP (!) 107/94   Pulse 98   Ht 6\' 2"  (1.88 m)   Wt 283 lb (128.4 kg)   BMI 36.34 kg/m    GEN: Well nourished, well developed, in no acute distress    HEENT: normal  Neck: no JVD, carotid bruits, or masses Cardiac: RRR; no murmurs, rubs, or gallops,no edema  Respiratory:  clear to auscultation bilaterally, normal work of breathing GI: soft, nontender, nondistended, + BS MS: no deformity or atrophy  Skin: warm and dry, no rash Neuro:  Alert and Oriented x 3, Strength and sensation are intact Psych: euthymic mood, full affect  Wt Readings from Last 3 Encounters:  02/16/16 283 lb (128.4 kg)  02/13/16 291 lb (132 kg)  02/04/16 299 lb (135.6 kg)  Studies/Labs Reviewed:   EKG:  EKG is ordered today.  The ekg ordered today demonstrates Normal sinus rhythm with baseline left bundle branch block. Heart rate 98. QTC 434  Recent Labs: 03/20/2015: ALT 40 01/20/2016: Brain Natriuretic Peptide 723.3 02/05/2016: BUN 38; Creat 1.83; Potassium 4.1; Sodium 139; TSH 3.39 02/12/2016: Hemoglobin 8.2; Platelets 193   Lipid Panel    Component Value Date/Time   CHOL 95 03/20/2015 0846   TRIG 55.0 03/20/2015 0846   HDL 34.30 (L) 03/20/2015 0846   CHOLHDL 3 03/20/2015 0846   VLDL 11.0 03/20/2015 0846   LDLCALC 49 03/20/2015 0846    Additional studies/ records that were reviewed today include:   Medtronic CRT P by Dr. Ladona Ridgelaylor 02/12/2016 left ventricular lead serial number was [id number] Medtronic MRI compatible biventricular pacemaker, serial number [id number] Medtronic model 5076 active fixation pacing lead serial [id number]   Echo 09/30/14 Mild LVH, EF 50-55, trivial AI, mild MR, severe LAE, mild RAE, PASP 37  Myoview 03/28/12 IMPRESSION: 1. Mild LV systolic dysfunction, EF 42% with inferior and inferoseptal hypokinesis. 2. Fixed medium-sized, severe basal to mid inferior and inferoseptal perfusion defect. This suggests prior infarction with minimal ischemia. 3. Intermediate risk study.  Echo 03/21/12 - mild LVH. EF 45% to 50%. There is akinesis of the inferior myocardium. Grade 1 diastolicdysfunction). - Mitral valve: Mild  regurgitation.  LHC 03/21/12 LM: 30% distal left main. LAD: Mild wall irregularities. There is a large diagonal with a 90-95% ostial lesion. LCx: The left circumflex gives rise to a single large marginal branch. There is a 70-80% ostial LCX lesion. RCA: The RCA is occluded proximally with evidence of recent thrombus. There are left to right collaterals to the distal RCA. Left ventriculography: Not performed. Final Conclusions:  1. 3 vessel obstructive CAD. Recent RCA occlusion. Recommendations: Would maximize medical therapy. Consider stress myoview on medical therapy to assess ischemic burden and symptoms. Ostial location of diagonal and LCX disease is not optimal for PCI. If significant symptoms or ischemia on medical therapy may need to consider CABG.   ASSESSMENT:    1. Acute on chronic diastolic heart failure (HCC)   2. Essential hypertension   3. Coronary artery disease involving native coronary artery of native heart without angina pectoris   4. Hyperlipidemia, unspecified hyperlipidemia type   5. Controlled type 2 diabetes mellitus with complication, without long-term current use of insulin (HCC)   6. CKD (chronic kidney disease), stage III   7. Cardiac pacemaker      PLAN:  In order of problems listed above:  1. Acute on chronic diastolic heart failure: Pending upcoming echocardiogram in early January given recent accumulation of fluid. He has lost roughly 10 pounds since his pacemaker placement. His overall volume has also improved quite a bit. He still has 1-2+ pitting edema, however his JVD has improved. His Lasix cut back to 40 mg daily due to concern for renal function. His longest quite clear on physical exam, I will continue him on the current dose of Lasix. I have instructed him to take an additional dose of 40 mg Lasix if his weight increased by more than 3 pounds overnight or 5 pounds in a single week. I have also instructed him on sodium and fluid  restriction. 2. Symptomatic 2:1 AV block s/p CRT-P: Upcoming wound clinic follow-up. No sign of infection, no fever or chill. Incision site looks good. 3. Coronary artery disease: Previous cardiac catheterization in 2014, three-vessel disease with high degree stenosis in some branch vessels, currently  on medical therapy.  4. CKD stage III, maybe occasionally stage IV: Need close monitoring, will obtain basic metabolic panel given recent aggressive diuresis. We'll continue on 40 mg daily of Lasix with instruction of additional Lasix if has weight gain greater than 3 pounds overnight and 5 pounds in a single week. 5. DM II: On Coumadin for right 6. Hyperlipidemia continue torsemide 80 mg daily.    Medication Adjustments/Labs and Tests Ordered: Current medicines are reviewed at length with the patient today.  Concerns regarding medicines are outlined above.  Medication changes, Labs and Tests ordered today are listed in the Patient Instructions below. Patient Instructions  Heart failure instruction: 1. Avoid salt 2. Limit daily fluid to < 2 liters (keep daily intake of fluid between 32 to 64 oz) 3. Weigh yourself every morning, if your weight increase by more than 3 lbs overnight or 5 lbs in a single week, please take additional 40mg  lasix in the afternoon.  Medication Instructions:  Continue current medications  Labwork: CBC and BMP  Testing/Procedures: None Ordered  Follow-Up: Your physician recommends that you schedule a follow-up appointment in: 2 Months with Dr Swaziland   Any Other Special Instructions Will Be Listed Below (If Applicable).     If you need a refill on your cardiac medications before your next appointment, please call your pharmacy.      Ramond Dial, [place]  02/16/2016 6:57 PM    [facility] [address] [place], [place], [place]  [address] Phone: [phone]; Fax: [phone]

## 2016-02-16 NOTE — Patient Instructions (Addendum)
Heart failure instruction: 1. Avoid salt 2. Limit daily fluid to < 2 liters (keep daily intake of fluid between 32 to 64 oz) 3. Weigh yourself every morning, if your weight increase by more than 3 lbs overnight or 5 lbs in a single week, please take additional 40mg  lasix in the afternoon.  Medication Instructions:  Continue current medications  Labwork: CBC and BMP  Testing/Procedures: None Ordered  Follow-Up: Your physician recommends that you schedule a follow-up appointment in: 2 Months with Dr SwazilandJordan   Any Other Special Instructions Will Be Listed Below (If Applicable).     If you need a refill on your cardiac medications before your next appointment, please call your pharmacy.

## 2016-02-17 LAB — BASIC METABOLIC PANEL
BUN: 17 mg/dL (ref 7–25)
CHLORIDE: 104 mmol/L (ref 98–110)
CO2: 24 mmol/L (ref 20–31)
CREATININE: 1.29 mg/dL — AB (ref 0.70–1.25)
Calcium: 8.7 mg/dL (ref 8.6–10.3)
GLUCOSE: 157 mg/dL — AB (ref 65–99)
Potassium: 4.1 mmol/L (ref 3.5–5.3)
Sodium: 137 mmol/L (ref 135–146)

## 2016-02-25 ENCOUNTER — Encounter (INDEPENDENT_AMBULATORY_CARE_PROVIDER_SITE_OTHER): Payer: Self-pay

## 2016-02-25 ENCOUNTER — Ambulatory Visit (INDEPENDENT_AMBULATORY_CARE_PROVIDER_SITE_OTHER): Payer: Medicare HMO | Admitting: *Deleted

## 2016-02-25 DIAGNOSIS — I255 Ischemic cardiomyopathy: Secondary | ICD-10-CM | POA: Diagnosis not present

## 2016-02-25 DIAGNOSIS — I441 Atrioventricular block, second degree: Secondary | ICD-10-CM

## 2016-02-25 NOTE — Progress Notes (Signed)
Wound check appointment. Steri-strips removed. Wound without redness or edema. Incision edges approximated, wound well healed. Normal device function. Bi-Vp 99%. Thresholds, sensing, and impedances consistent with implant measurements. Device programmed at 3.5Vfor extra safety margin until 3 month visit. Histogram distribution appropriate for patient and level of activity. No mode switches or high ventricular rates noted. Patient educated about wound care, arm mobility, lifting restrictions. ROV in 05/20/2015 w/GT.

## 2016-03-02 ENCOUNTER — Other Ambulatory Visit: Payer: Self-pay

## 2016-03-02 ENCOUNTER — Ambulatory Visit (HOSPITAL_COMMUNITY): Payer: Medicare HMO | Attending: Cardiovascular Disease

## 2016-03-02 DIAGNOSIS — R9439 Abnormal result of other cardiovascular function study: Secondary | ICD-10-CM | POA: Insufficient documentation

## 2016-03-02 DIAGNOSIS — I5030 Unspecified diastolic (congestive) heart failure: Secondary | ICD-10-CM | POA: Insufficient documentation

## 2016-03-02 DIAGNOSIS — I10 Essential (primary) hypertension: Secondary | ICD-10-CM

## 2016-03-02 DIAGNOSIS — I340 Nonrheumatic mitral (valve) insufficiency: Secondary | ICD-10-CM | POA: Insufficient documentation

## 2016-03-02 DIAGNOSIS — I517 Cardiomegaly: Secondary | ICD-10-CM | POA: Insufficient documentation

## 2016-03-03 ENCOUNTER — Encounter: Payer: Self-pay | Admitting: Physician Assistant

## 2016-03-03 ENCOUNTER — Telehealth: Payer: Self-pay | Admitting: *Deleted

## 2016-03-03 NOTE — Telephone Encounter (Signed)
Pt notified of echo results and findings by phone with verbal understanding to results given today. Pt is agreeable to plan of care to continue current Tx plan. I will send a copy of results to PCP.

## 2016-03-10 ENCOUNTER — Ambulatory Visit: Payer: Medicare HMO | Admitting: Gastroenterology

## 2016-03-11 LAB — HM DIABETES EYE EXAM

## 2016-04-18 NOTE — Progress Notes (Signed)
Cardiology Office Note    Date:  04/20/2016   ID:  [name] [name], DOB Nov 10, 1950, MRN [id number]  PCP:  [name] Dopp, MD  Cardiologist:  Dr. Swaziland [name]: Dr. Ladona [name]  Chief Complaint  Patient presents with  . Follow-up  . Congestive Heart Failure    History of Present Illness:  [name] [name] is a 66 y.o. male with PMH of CAD, ICD, HTN, HLD, DM, CKD, PAF, and bradycardia with h/o mobitz II AV block. He is not on ACE inhibitor due to renal disease. He has suffered a NSTEMI in January 2014 demonstrating multivessel CAD with occluded RCA with collaterals and a high-grade stenosis at the ostium of the first diagonal branch. There was also moderate 70% ostial left circumflex lesion. Medical therapy was recommended along with subsequent Myoview for assessment of ischemic burden. Nuclear study demonstrating inferior and inferoseptal scar with minimal peri-infarct ischemia. Ejection fraction on echocardiogram was 45-50%. He had a repeat echocardiogram in August 2016 that showed EF 50-55%, severe left atrial enlargement, PASP 37 mmHg. He was  seen on 01/20/2016, at which time he appeared to have massive lower extremity edema and elevated JVP consistent with right-sided heart failure. His Lasix was increased to 40 mg twice a day.  He was seen by Dr. Ladona [name] in the office on 02/04/2016, due to symptomatic 2:1 AV block, pacemaker was recommended with attempt of His bundle pacing. He eventually underwent successful Medtronic biventricular CRT-P insertion on 02/12/2016. Repeat Echo in January 2018 showed normal LV systolic function, diastolic dysfunction, mild MR and mild pulmonary HTN.  On follow up today he is doing markedly better. He has lost 43 lbs. Edema has resolved. He denies any dyspnea, chest pain, or dizziness. He is restricting his salt intake. 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 Reports sugars are doing well on Trulicity.   Past Medical History:  Diagnosis Date  .  Arthritis   . CKD (chronic kidney disease) stage 3, GFR 30-59 ml/min    CKD  . Coronary artery disease    s/p NSTEMI with multivessel CAD wit hoccluded RCA; myoview with inferior and inferoseptal infarct with minimal peri infarct ishemia - managed medically  . Diabetes mellitus   . Diabetes mellitus type 2 in obese (HCC)   . Dysrhythmia 03/2012   bradycardia  . Heart murmur   . History of echocardiogram    Echo 8/16:  Mild LVH, EF 50-55%, trivial AI, mild MR, severe LAE, mild RAE, PASP 37 mmHg  //  Echo 03/02/16: Mild concentric LVH, EF 50-55, normal wall motion, grade 2 diastolic dysfunction, mild MR, moderate to severe LAE, PASP 35  . Hypertension   . LV dysfunction Jan 2014   EF is 45 to 50% per echo  . Myocardial infarction 03/2012  . Neuromuscular disorder (HCC)    TREMORS  . Obese   . PAF (paroxysmal atrial fibrillation) [facility]) Jan 2014   Brief episode - no anticoagulation    Past Surgical History:  Procedure Laterality Date  . CARDIAC CATHETERIZATION  03/21/2012  . [name] IMPLANTABLE DEVICE N/A 02/12/2016   Procedure: BiV Pacemaker Insertion CRT-P;  Surgeon: [name] Maw, MD;  Location: [facility] INVASIVE CV LAB;  Service: Cardiovascular;  Laterality: N/A;  . LEFT HEART CATHETERIZATION WITH CORONARY ANGIOGRAM N/A 03/21/2012   Procedure: LEFT HEART CATHETERIZATION WITH CORONARY ANGIOGRAM;  Surgeon: [name] Allaire M Swaziland, MD;  Location: [facility] CATH LAB;  Service: Cardiovascular;  Laterality: N/A;  . RIB FRACTURE SURGERY    .  s/p knee surgery for torn ligament      Current Medications: Outpatient Medications Prior to Visit  Medication Sig Dispense Refill  . amLODipine (NORVASC) 10 MG tablet Take 10 mg by mouth daily.     Marland Kitchen. atorvastatin (LIPITOR) 80 MG tablet Take 1 tablet (80 mg total) by mouth daily at 6 PM. 30 tablet 6  . BAYER MICROLET LANCETS lancets Use as instructed to check blood sugar once daily dx code 250.42 100 each 1  . clopidogrel (PLAVIX) 75 MG tablet Take 1 tablet (75 mg total) by  mouth daily. (Patient taking differently: Take 75 mg by mouth every other day. ) 30 tablet 6  . furosemide (LASIX) 40 MG tablet Take 40 mg by mouth every other day.    Marland Kitchen. glucose blood (ONETOUCH VERIO) test strip Use as instructed to check blood sugar once a day dx code E11.29 100 each 1  . isosorbide mononitrate (IMDUR) 60 MG 24 hr tablet Take 1 tablet (60 mg total) by mouth daily. 30 tablet 6  . losartan (COZAAR) 25 MG tablet Take 1 tablet (25 mg total) by mouth daily. NEED OV. 90 tablet 0  . Multiple Vitamin (MULTIVITAMIN WITH MINERALS) TABS Take 1 tablet by mouth daily.    Marland Kitchen. glimepiride (AMARYL) 1 MG tablet Take 1 mg by mouth daily with breakfast.      No facility-administered medications prior to visit.      Allergies:   Metformin and related   Social History   Social History  . Marital status: Married    Spouse name: N/A  . Number of children: N/A  . Years of education: N/A   Social History Main Topics  . Smoking status: Former Smoker    Packs/day: 0.25    Years: 6.00    Types: Cigarettes    Start date: 06/30/1966    Quit date: 06/29/1972  . Smokeless tobacco: Never Used  . Alcohol use No  . Drug use: No  . Sexual activity: No   Other Topics Concern  . None   Social History Narrative  . None     Family History:  The patient's family history includes Heart disease in his father.   ROS:   Please see the history of present illness.    ROS All other systems reviewed and are negative.   PHYSICAL EXAM:   VS:  BP (!) 146/75 (BP Location: Right Arm, Patient Position: Sitting, Cuff Size: Normal)   Pulse 72   Ht 6' (1.829 m)   Wt 240 lb 12.8 oz (109.2 kg)   SpO2 100%   BMI 32.66 kg/m    GEN: Well nourished, well developed, in no acute distress  HEENT: normal  Neck: no JVD, carotid bruits, or masses Cardiac: RRR; no murmurs, rubs, or gallops,no edema  Respiratory:  clear to auscultation bilaterally, normal work of breathing GI: soft, nontender, nondistended, +  BS MS: no deformity or atrophy  Skin: warm and dry, no rash Neuro:  Alert and Oriented x 3, Strength and sensation are intact Psych: euthymic mood, full affect  Wt Readings from Last 3 Encounters:  04/20/16 240 lb 12.8 oz (109.2 kg)  02/16/16 283 lb (128.4 kg)  02/13/16 291 lb (132 kg)      Studies/Labs Reviewed:   EKG:  EKG is not ordered today.   Recent Labs: 01/20/2016: Brain Natriuretic Peptide 723.3 02/05/2016: TSH 3.39 02/16/2016: BUN 17; Creat 1.29; Hemoglobin 8.1; Platelets 209; Potassium 4.1; Sodium 137   Lipid Panel  Component Value Date/Time   CHOL 95 03/20/2015 0846   TRIG 55.0 03/20/2015 0846   HDL 34.30 (L) 03/20/2015 0846   CHOLHDL 3 03/20/2015 0846   VLDL 11.0 03/20/2015 0846   LDLCALC 49 03/20/2015 0846    Additional studies/ records that were reviewed today include:   Lipid panel dated 11/06/15: cholesterol 77, triglycerides 57, HDL 30, LDL 36.  Echo 03/02/16:Study Conclusions  - Left ventricle: The cavity size was normal. There was mild   concentric hypertrophy. Systolic function was normal. The   estimated ejection fraction was in the range of 50% to 55%. Wall   motion was normal; there were no regional wall motion   abnormalities. Features are consistent with a pseudonormal left   ventricular filling pattern, with concomitant abnormal relaxation   and increased filling pressure (grade 2 diastolic dysfunction). - Mitral valve: There was mild regurgitation directed centrally. - Left atrium: The atrium was moderately to severely dilated. - Pulmonary arteries: Systolic pressure was mildly increased. PA   peak pressure: 35 mm Hg (S).  Myoview 03/28/12 IMPRESSION: 1. Mild LV systolic dysfunction, EF 42% with inferior and inferoseptal hypokinesis. 2. Fixed medium-sized, severe basal to mid inferior and inferoseptal perfusion defect. This suggests prior infarction with minimal ischemia. 3. Intermediate risk study.   LHC 03/21/12 LM: 30% distal  left main. LAD: Mild wall irregularities. There is a large diagonal with a 90-95% ostial lesion. LCx: The left circumflex gives rise to a single large marginal branch. There is a 70-80% ostial LCX lesion. RCA: The RCA is occluded proximally with evidence of recent thrombus. There are left to right collaterals to the distal RCA. Left ventriculography: Not performed. Final Conclusions:  1. 3 vessel obstructive CAD. Recent RCA occlusion. Recommendations: Would maximize medical therapy. Consider stress myoview on medical therapy to assess ischemic burden and symptoms. Ostial location of diagonal and LCX disease is not optimal for PCI. If significant symptoms or ischemia on medical therapy may need to consider CABG.   ASSESSMENT:    1. Mobitz type 2 second degree atrioventricular block   2. Coronary artery disease involving native coronary artery of native heart without angina pectoris   3. CKD (chronic kidney disease), stage III   4. Chronic combined systolic and diastolic heart failure (HCC)      PLAN:  In order of problems listed above:  1. Chronic diastolic heart failure: Recent Echo showed normal LV systolic function. He has lost roughly 43 pounds since his pacemaker placement. His overall volume status is good.   I have also instructed him on sodium and fluid restriction. 2. 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 3. Coronary artery disease: Previous cardiac catheterization in 2014, three-vessel disease with high degree stenosis in some branch vessels, currently on medical therapy. Asymptomatic.  4. CKD stage III 5. DM II: on Trulicity. 6. Hyperlipidemia continue atorvastatin 80 mg daily.    Medication Adjustments/Labs and Tests Ordered: Current medicines are reviewed at length with the patient today.  Concerns regarding medicines are outlined above.  Medication changes, Labs and Tests ordered today are listed in the Patient Instructions below. Patient  Instructions  Continue your current therapy  I will see you in 4 months     Signed, Makendra Vigeant Swaziland, MD  04/20/2016 3:17 PM    [facility]

## 2016-04-20 ENCOUNTER — Ambulatory Visit (INDEPENDENT_AMBULATORY_CARE_PROVIDER_SITE_OTHER): Payer: Medicare HMO | Admitting: Cardiology

## 2016-04-20 ENCOUNTER — Encounter: Payer: Self-pay | Admitting: Cardiology

## 2016-04-20 VITALS — BP 146/75 | HR 72 | Ht 72.0 in | Wt 240.8 lb

## 2016-04-20 DIAGNOSIS — I5042 Chronic combined systolic (congestive) and diastolic (congestive) heart failure: Secondary | ICD-10-CM

## 2016-04-20 DIAGNOSIS — N183 Chronic kidney disease, stage 3 unspecified: Secondary | ICD-10-CM

## 2016-04-20 DIAGNOSIS — I2510 Atherosclerotic heart disease of native coronary artery without angina pectoris: Secondary | ICD-10-CM

## 2016-04-20 DIAGNOSIS — I441 Atrioventricular block, second degree: Secondary | ICD-10-CM

## 2016-04-20 NOTE — Patient Instructions (Addendum)
Continue your current therapy  I will see you in 4 months  

## 2016-05-19 ENCOUNTER — Encounter (INDEPENDENT_AMBULATORY_CARE_PROVIDER_SITE_OTHER): Payer: Self-pay

## 2016-05-19 ENCOUNTER — Encounter: Payer: Self-pay | Admitting: Internal Medicine

## 2016-05-19 ENCOUNTER — Ambulatory Visit (INDEPENDENT_AMBULATORY_CARE_PROVIDER_SITE_OTHER): Payer: Medicare HMO | Admitting: Internal Medicine

## 2016-05-19 VITALS — BP 124/76 | HR 74 | Ht 73.5 in | Wt 236.8 lb

## 2016-05-19 DIAGNOSIS — I441 Atrioventricular block, second degree: Secondary | ICD-10-CM | POA: Diagnosis not present

## 2016-05-19 DIAGNOSIS — I5042 Chronic combined systolic (congestive) and diastolic (congestive) heart failure: Secondary | ICD-10-CM | POA: Diagnosis not present

## 2016-05-19 LAB — CUP PACEART INCLINIC DEVICE CHECK
Battery Remaining Longevity: 119 mo
Battery Voltage: 3.14 V
Brady Statistic AP VS Percent: 0.01 %
Brady Statistic AS VP Percent: 94.46 %
Brady Statistic AS VS Percent: 0.3 %
Brady Statistic RA Percent Paced: 5.26 %
Brady Statistic RV Percent Paced: 99.69 %
Date Time Interrogation Session: 20180321092715
Implantable Lead Implant Date: 20171214
Implantable Lead Location: 753859
Implantable Lead Location: 753860
Implantable Lead Model: 5076
Lead Channel Impedance Value: 399 Ohm
Lead Channel Impedance Value: 494 Ohm
Lead Channel Impedance Value: 551 Ohm
Lead Channel Impedance Value: 570 Ohm
Lead Channel Impedance Value: 589 Ohm
Lead Channel Impedance Value: 665 Ohm
Lead Channel Pacing Threshold Amplitude: 0.5 V
Lead Channel Pacing Threshold Pulse Width: 0.4 ms
Lead Channel Pacing Threshold Pulse Width: 0.4 ms
Lead Channel Sensing Intrinsic Amplitude: 2.25 mV
Lead Channel Sensing Intrinsic Amplitude: 4.125 mV
Lead Channel Sensing Intrinsic Amplitude: 9.375 mV
Lead Channel Setting Pacing Amplitude: 2 V
Lead Channel Setting Pacing Amplitude: 2.5 V
Lead Channel Setting Pacing Pulse Width: 0.4 ms
Lead Channel Setting Sensing Sensitivity: 2.8 mV
MDC IDC LEAD IMPLANT DT: 20171214
MDC IDC LEAD IMPLANT DT: 20171214
MDC IDC LEAD LOCATION: 753858
MDC IDC MSMT LEADCHNL LV IMPEDANCE VALUE: 494 Ohm
MDC IDC MSMT LEADCHNL LV PACING THRESHOLD AMPLITUDE: 1 V
MDC IDC MSMT LEADCHNL RA IMPEDANCE VALUE: 361 Ohm
MDC IDC MSMT LEADCHNL RA IMPEDANCE VALUE: 570 Ohm
MDC IDC MSMT LEADCHNL RA PACING THRESHOLD PULSEWIDTH: 0.4 ms
MDC IDC MSMT LEADCHNL RV IMPEDANCE VALUE: 418 Ohm
MDC IDC MSMT LEADCHNL RV PACING THRESHOLD AMPLITUDE: 0.75 V
MDC IDC MSMT LEADCHNL RV SENSING INTR AMPL: 10.625 mV
MDC IDC PG IMPLANT DT: 20171214
MDC IDC SET LEADCHNL LV PACING AMPLITUDE: 1.5 V
MDC IDC SET LEADCHNL LV PACING PULSEWIDTH: 0.4 ms
MDC IDC STAT BRADY AP VP PERCENT: 5.24 %

## 2016-05-19 NOTE — Progress Notes (Signed)
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 He is a 66 yo man with multiple medical problems who has had worsening peripheral edema which has gotten better since his PPM. The patient has not had syncope and he has mild LV dysfunction by echo.  He has LA enlargement. No anginal symptoms His voice has been hoarse during this same time period.  Allergies  Allergen Reactions  . Metformin And Related Other (See Comments)    Renal insufficiency     Current Outpatient Prescriptions  Medication Sig Dispense Refill  . amLODipine (NORVASC) 10 MG tablet Take 10 mg by mouth daily.     Marland Kitchen atorvastatin (LIPITOR) 80 MG tablet Take 1 tablet (80 mg total) by mouth daily at 6 PM. 30 tablet 6  . BAYER MICROLET LANCETS lancets Use as instructed to check blood sugar once daily dx code 250.42 100 each 1  . clopidogrel (PLAVIX) 75 MG tablet Take 1 tablet (75 mg total) by mouth daily. 30 tablet 6  . Dulaglutide 1.5 MG/0.5ML SOPN Inject 0.5 mLs into the skin once a week.    . furosemide (LASIX) 40 MG tablet Take 40 mg by mouth every other day.    Marland Kitchen glucose blood (ONETOUCH VERIO) test strip Use as instructed to check blood sugar once a day dx code E11.29 100 each 1  . isosorbide mononitrate (IMDUR) 60 MG 24 hr tablet Take 1 tablet (60 mg total) by mouth daily. 30 tablet 6  . losartan (COZAAR) 25 MG tablet Take 1 tablet (25 mg total) by mouth daily. NEED OV. 90 tablet 0  . Multiple Vitamin (MULTIVITAMIN WITH MINERALS) TABS Take 1 tablet by mouth daily.     No current facility-administered medications for this visit.      Past Medical History:  Diagnosis Date  . Arthritis   . CKD (chronic kidney disease) stage 3, GFR 30-59 ml/min    CKD  . Coronary artery disease    s/p NSTEMI with multivessel CAD wit hoccluded RCA; myoview with inferior and inferoseptal infarct with minimal peri infarct ishemia - managed medically  . Diabetes mellitus   . Diabetes mellitus type 2 in  obese (HCC)   . Dysrhythmia 03/2012   bradycardia  . Heart murmur   . History of echocardiogram    Echo 8/16:  Mild LVH, EF 50-55%, trivial AI, mild MR, severe LAE, mild RAE, PASP 37 mmHg  //  Echo 03/02/16: Mild concentric LVH, EF 50-55, normal wall motion, grade 2 diastolic dysfunction, mild MR, moderate to severe LAE, PASP 35  . Hypertension   . LV dysfunction Jan 2014   EF is 45 to 50% per echo  . Myocardial infarction 03/2012  . Neuromuscular disorder (HCC)    TREMORS  . Obese   . PAF (paroxysmal atrial fibrillation) (HCC) Jan 2014   Brief episode - no anticoagulation    ROS:   All systems reviewed and negative except as noted in the HPI.   Past Surgical History:  Procedure Laterality Date  . CARDIAC CATHETERIZATION  03/21/2012  . EP IMPLANTABLE DEVICE N/A 02/12/2016   Procedure: BiV Pacemaker Insertion CRT-P;  Surgeon: Marinus Maw, MD;  Location: [facility] INVASIVE CV LAB;  Service: Cardiovascular;  Laterality: N/A;  . LEFT HEART CATHETERIZATION WITH CORONARY ANGIOGRAM N/A 03/21/2012   Procedure: LEFT HEART CATHETERIZATION WITH CORONARY ANGIOGRAM;  Surgeon: Peter M Swaziland, MD;  Location: [facility] CATH LAB;  Service: Cardiovascular;  Laterality: N/A;  .  RIB FRACTURE SURGERY    . s/p knee surgery for torn ligament       Family History  Problem Relation Age of Onset  . Heart disease Father      Social History   Social History  . Marital status: Married    Spouse name: N/A  . Number of children: N/A  . Years of education: N/A   Occupational History  . Not on file.   Social History Main Topics  . Smoking status: Former Smoker    Packs/day: 0.25    Years: 6.00    Types: Cigarettes    Start date: 06/30/1966    Quit date: 06/29/1972  . Smokeless tobacco: Never Used  . Alcohol use No  . Drug use: No  . Sexual activity: No   Other Topics Concern  . Not on file   Social History Narrative  . No narrative on file     BP 124/76   Pulse 74   Ht 6' 1.5" (1.867 m)   Wt 236  lb 12.8 oz (107.4 kg)   BMI 30.82 kg/m   Physical Exam:  obese appearing NAD HEENT: Unremarkable Neck:  No JVD, no thyromegally Lymphatics:  No adenopathy Back:  No CVA tenderness Lungs:  Clear with minimal basilar rales HEART:  Regular rate rhythm, no murmurs, no rubs, no clicks Abd:  soft, positive bowel sounds, no organomegally, no rebound, no guarding Ext:  2 plus pulses, 2+peripheral edema, no cyanosis, no clubbing, left arm is mildly swollen compared to the right arm. Skin:  No rashes no nodules Neuro:  CN II through XII intact, motor grossly intact  EKG - nsr with 2:1 AV block   Assess/Plan: 1. 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 He is doing well, s/p PPM insertion. Will follow. 2. Hoarseness - the etiology is unclear. 3. Left arm swelling - it has resolved since his PPM insertion. 4. PPM - his medtronic BiV PPM is working normally. Will follow.  [name] ReevesGregg [name],M.D.

## 2016-05-19 NOTE — Patient Instructions (Addendum)
Medication Instructions:  Your physician recommends that you continue on your current medications as directed. Please refer to the Current Medication list given to you today.   Labwork: None ordered  Testing/Procedures: None ordered  Follow-Up: Remote monitoring is used to monitor your Pacemaker from home. This monitoring reduces the number of office visits required to check your device to one time per year. It allows us to keep an eye on the functioning of your device to ensure it is working properly. You are scheduled for a device check from home on 08/18/16. You may send your transmission at any time that day. If you have a wireless device, the transmission will be sent automatically. After your physician reviews your transmission, you will receive a postcard with your next transmission date.  Your physician wants you to follow-up in: 9 months with Dr. Ladona Ridgelaylor. You will receive a reminder letter in the mail two months in advance. If you don't receive a letter, please call our office to schedule the follow-up appointment.   Any Other Special Instructions Will Be Listed Below (If Applicable).     If you need a refill on your cardiac medications before your next appointment, please call your pharmacy.

## 2016-06-18 ENCOUNTER — Encounter (HOSPITAL_COMMUNITY): Payer: Self-pay | Admitting: *Deleted

## 2016-06-18 ENCOUNTER — Inpatient Hospital Stay (HOSPITAL_COMMUNITY)
Admission: EM | Admit: 2016-06-18 | Discharge: 2016-06-19 | DRG: 300 | Disposition: A | Discharge status: Home / Self Care | Payer: Medicare HMO | Attending: Internal Medicine | Admitting: Internal Medicine

## 2016-06-18 DIAGNOSIS — X58XXXA Exposure to other specified factors, initial encounter: Secondary | ICD-10-CM | POA: Diagnosis present

## 2016-06-18 DIAGNOSIS — E669 Obesity, unspecified: Secondary | ICD-10-CM | POA: Diagnosis present

## 2016-06-18 DIAGNOSIS — Z87891 Personal history of nicotine dependence: Secondary | ICD-10-CM | POA: Diagnosis not present

## 2016-06-18 DIAGNOSIS — T82867A Thrombosis of cardiac prosthetic devices, implants and grafts, initial encounter: Secondary | ICD-10-CM | POA: Diagnosis not present

## 2016-06-18 DIAGNOSIS — Z950 Presence of cardiac pacemaker: Secondary | ICD-10-CM

## 2016-06-18 DIAGNOSIS — Z7902 Long term (current) use of antithrombotics/antiplatelets: Secondary | ICD-10-CM | POA: Diagnosis not present

## 2016-06-18 DIAGNOSIS — I252 Old myocardial infarction: Secondary | ICD-10-CM

## 2016-06-18 DIAGNOSIS — E785 Hyperlipidemia, unspecified: Secondary | ICD-10-CM | POA: Diagnosis present

## 2016-06-18 DIAGNOSIS — E1122 Type 2 diabetes mellitus with diabetic chronic kidney disease: Secondary | ICD-10-CM | POA: Diagnosis present

## 2016-06-18 DIAGNOSIS — I5032 Chronic diastolic (congestive) heart failure: Secondary | ICD-10-CM | POA: Diagnosis present

## 2016-06-18 DIAGNOSIS — E119 Type 2 diabetes mellitus without complications: Secondary | ICD-10-CM

## 2016-06-18 DIAGNOSIS — Z7984 Long term (current) use of oral hypoglycemic drugs: Secondary | ICD-10-CM | POA: Diagnosis not present

## 2016-06-18 DIAGNOSIS — I130 Hypertensive heart and chronic kidney disease with heart failure and stage 1 through stage 4 chronic kidney disease, or unspecified chronic kidney disease: Secondary | ICD-10-CM | POA: Diagnosis present

## 2016-06-18 DIAGNOSIS — I5023 Acute on chronic systolic (congestive) heart failure: Secondary | ICD-10-CM

## 2016-06-18 DIAGNOSIS — I82621 Acute embolism and thrombosis of deep veins of right upper extremity: Secondary | ICD-10-CM | POA: Diagnosis present

## 2016-06-18 DIAGNOSIS — T8172XA Complication of vein following a procedure, not elsewhere classified, initial encounter: Secondary | ICD-10-CM | POA: Diagnosis present

## 2016-06-18 DIAGNOSIS — Z6830 Body mass index (BMI) 30.0-30.9, adult: Secondary | ICD-10-CM | POA: Diagnosis not present

## 2016-06-18 DIAGNOSIS — Z8249 Family history of ischemic heart disease and other diseases of the circulatory system: Secondary | ICD-10-CM | POA: Diagnosis not present

## 2016-06-18 DIAGNOSIS — N183 Chronic kidney disease, stage 3 unspecified: Secondary | ICD-10-CM | POA: Diagnosis present

## 2016-06-18 DIAGNOSIS — Z79899 Other long term (current) drug therapy: Secondary | ICD-10-CM

## 2016-06-18 DIAGNOSIS — Y712 Prosthetic and other implants, materials and accessory cardiovascular devices associated with adverse incidents: Secondary | ICD-10-CM | POA: Diagnosis not present

## 2016-06-18 DIAGNOSIS — Z888 Allergy status to other drugs, medicaments and biological substances status: Secondary | ICD-10-CM

## 2016-06-18 DIAGNOSIS — I82409 Acute embolism and thrombosis of unspecified deep veins of unspecified lower extremity: Secondary | ICD-10-CM | POA: Diagnosis present

## 2016-06-18 DIAGNOSIS — I2510 Atherosclerotic heart disease of native coronary artery without angina pectoris: Secondary | ICD-10-CM | POA: Diagnosis present

## 2016-06-18 DIAGNOSIS — I480 Paroxysmal atrial fibrillation: Secondary | ICD-10-CM | POA: Diagnosis present

## 2016-06-18 DIAGNOSIS — I441 Atrioventricular block, second degree: Secondary | ICD-10-CM | POA: Diagnosis present

## 2016-06-18 LAB — CBC
HCT: 30.6 % — ABNORMAL LOW (ref 39.0–52.0)
HEMOGLOBIN: 10.6 g/dL — AB (ref 13.0–17.0)
MCH: 30.6 pg (ref 26.0–34.0)
MCHC: 34.6 g/dL (ref 30.0–36.0)
MCV: 88.4 fL (ref 78.0–100.0)
Platelets: 182 10*3/uL (ref 150–400)
RBC: 3.46 MIL/uL — ABNORMAL LOW (ref 4.22–5.81)
RDW: 13.8 % (ref 11.5–15.5)
WBC: 5.3 10*3/uL (ref 4.0–10.5)

## 2016-06-18 LAB — PROTIME-INR
INR: 1.05
PROTHROMBIN TIME: 13.7 s (ref 11.4–15.2)

## 2016-06-18 LAB — BASIC METABOLIC PANEL
ANION GAP: 12 (ref 5–15)
BUN: 36 mg/dL — AB (ref 6–20)
CALCIUM: 9.4 mg/dL (ref 8.9–10.3)
CO2: 22 mmol/L (ref 22–32)
Chloride: 98 mmol/L — ABNORMAL LOW (ref 101–111)
Creatinine, Ser: 1.92 mg/dL — ABNORMAL HIGH (ref 0.61–1.24)
GFR calc Af Amer: 40 mL/min — ABNORMAL LOW (ref >60)
GFR calc non Af Amer: 35 mL/min — ABNORMAL LOW (ref >60)
GLUCOSE: 226 mg/dL — AB (ref 65–99)
Potassium: 3.9 mmol/L (ref 3.5–5.1)
Sodium: 132 mmol/L — ABNORMAL LOW (ref 135–145)

## 2016-06-18 LAB — GLUCOSE, CAPILLARY
GLUCOSE-CAPILLARY: 135 mg/dL — AB (ref 65–99)
Glucose-Capillary: 218 mg/dL — ABNORMAL HIGH (ref 65–99)

## 2016-06-18 LAB — HEPARIN LEVEL (UNFRACTIONATED): HEPARIN UNFRACTIONATED: 0.36 [IU]/mL (ref 0.30–0.70)

## 2016-06-18 MED ORDER — ISOSORBIDE MONONITRATE ER 60 MG PO TB24
60.0000 mg | ORAL_TABLET | Freq: Every day | ORAL | Status: DC
  Administered 2016-06-18 – 2016-06-19 (×2): 60 mg via ORAL
  Filled 2016-06-18 (×2): qty 1

## 2016-06-18 MED ORDER — ACETAMINOPHEN 325 MG PO TABS
650.0000 mg | ORAL_TABLET | Freq: Four times a day (QID) | ORAL | Status: DC | PRN
Start: 2016-06-18 — End: 2016-06-19

## 2016-06-18 MED ORDER — SODIUM CHLORIDE 0.9% FLUSH
3.0000 mL | Freq: Two times a day (BID) | INTRAVENOUS | Status: DC
  Administered 2016-06-18 – 2016-06-19 (×2): 3 mL via INTRAVENOUS

## 2016-06-18 MED ORDER — AMLODIPINE BESYLATE 10 MG PO TABS
10.0000 mg | ORAL_TABLET | Freq: Every day | ORAL | Status: DC
  Administered 2016-06-18 – 2016-06-19 (×2): 10 mg via ORAL
  Filled 2016-06-18 (×2): qty 1

## 2016-06-18 MED ORDER — ATORVASTATIN CALCIUM 80 MG PO TABS
80.0000 mg | ORAL_TABLET | Freq: Every day | ORAL | Status: DC
  Administered 2016-06-18: 80 mg via ORAL
  Filled 2016-06-18: qty 1

## 2016-06-18 MED ORDER — ACETAMINOPHEN 650 MG RE SUPP: 650.0000 mg | Freq: Four times a day (QID) | RECTAL | Status: DC | PRN

## 2016-06-18 MED ORDER — INSULIN ASPART 100 UNIT/ML ~~LOC~~ SOLN: 0.0000 [IU] | Freq: Every day | SUBCUTANEOUS | Status: DC

## 2016-06-18 MED ORDER — PROMETHAZINE HCL 25 MG PO TABS: 12.5000 mg | ORAL_TABLET | Freq: Four times a day (QID) | ORAL | Status: DC | PRN

## 2016-06-18 MED ORDER — HEPARIN (PORCINE) IN NACL 100-0.45 UNIT/ML-% IJ SOLN
1800.0000 [IU]/h | INTRAMUSCULAR | Status: AC
  Administered 2016-06-18: 1600 [IU]/h via INTRAVENOUS
  Administered 2016-06-19: 1800 [IU]/h via INTRAVENOUS
  Filled 2016-06-18 (×2): qty 250

## 2016-06-18 MED ORDER — FUROSEMIDE 40 MG PO TABS
40.0000 mg | ORAL_TABLET | ORAL | Status: DC
  Administered 2016-06-19: 40 mg via ORAL
  Filled 2016-06-18: qty 1

## 2016-06-18 MED ORDER — HEPARIN BOLUS VIA INFUSION
5000.0000 [IU] | Freq: Once | INTRAVENOUS | Status: AC
  Administered 2016-06-18: 5000 [IU] via INTRAVENOUS
  Filled 2016-06-18: qty 5000

## 2016-06-18 MED ORDER — LOSARTAN POTASSIUM 25 MG PO TABS
25.0000 mg | ORAL_TABLET | Freq: Every day | ORAL | Status: DC
  Administered 2016-06-18 – 2016-06-19 (×2): 25 mg via ORAL
  Filled 2016-06-18 (×2): qty 1

## 2016-06-18 MED ORDER — SENNOSIDES-DOCUSATE SODIUM 8.6-50 MG PO TABS: 1.0000 | ORAL_TABLET | Freq: Every evening | ORAL | Status: DC | PRN

## 2016-06-18 MED ORDER — CLOPIDOGREL BISULFATE 75 MG PO TABS
75.0000 mg | ORAL_TABLET | Freq: Every day | ORAL | Status: DC
  Administered 2016-06-19: 75 mg via ORAL
  Filled 2016-06-18: qty 1

## 2016-06-18 MED ORDER — INSULIN ASPART 100 UNIT/ML ~~LOC~~ SOLN
0.0000 [IU] | Freq: Three times a day (TID) | SUBCUTANEOUS | Status: DC
  Administered 2016-06-18: 5 [IU] via SUBCUTANEOUS
  Administered 2016-06-19: 3 [IU] via SUBCUTANEOUS

## 2016-06-18 NOTE — Progress Notes (Signed)
ANTICOAGULATION CONSULT NOTE - Initial Consult  Pharmacy Consult for heparin Indication: DVT  Allergies  Allergen Reactions  . Metformin And Related Other (See Comments)    Renal insufficiency    Patient Measurements: Height:  (188 cm) Weight: 237 lb (107.5 kg) IBW/kg (Calculated) : 82.2 Heparin Dosing Weight: 104.2  Vital Signs: Temp: 98.1 F (36.7 C) (04/20 1025) Temp Source: Oral (04/20 1025) BP: 177/89 (04/20 1026) Pulse Rate: 72 (04/20 1025)  Labs:  Recent Labs  06/18/16 1034  HGB 10.6*  HCT 30.6*  PLT 182  LABPROT 13.7  INR 1.05  CREATININE 1.92*    Estimated Creatinine Clearance: 49.4 mL/min (A) (by C-G formula based on SCr of 1.92 mg/dL (H)).   Medical History: Past Medical History:  Diagnosis Date  . Arthritis   . CKD (chronic kidney disease) stage 3, GFR 30-59 ml/min    CKD  . Coronary artery disease    s/p NSTEMI with multivessel CAD wit hoccluded RCA; myoview with inferior and inferoseptal infarct with minimal peri infarct ishemia - managed medically  . Diabetes mellitus   . Diabetes mellitus type 2 in obese (HCC)   . Dysrhythmia 03/2012   bradycardia  . Heart murmur   . History of echocardiogram    Echo 8/16:  Mild LVH, EF 50-55%, trivial AI, mild MR, severe LAE, mild RAE, PASP 37 mmHg  //  Echo 03/02/16: Mild concentric LVH, EF 50-55, normal wall motion, grade 2 diastolic dysfunction, mild MR, moderate to severe LAE, PASP 35  . Hypertension   . LV dysfunction Jan 2014   EF is 45 to 50% per echo  . Myocardial infarction (HCC) 03/2012  . Neuromuscular disorder (HCC)    TREMORS  . Obese   . PAF (paroxysmal atrial fibrillation) [facility]) Jan 2014   Brief episode - no anticoagulation    Medications:   (Not in a hospital admission)  Assessment: [name] is a 66 y.o. male with RUE swelling found to have thrombus. Patient with history of atrial fibrillation, but not on anticoagulation prior to admission. HgB 10.6 PLT WNL.   Goal of  Therapy:  Heparin level 0.3-0.7 units/ml Monitor platelets by anticoagulation protocol: Yes   Plan:  Give 5000 units bolus x 1 Start heparin infusion at  1600 units/hr Check anti-Xa level in 6 hours and daily while on heparin Continue to monitor H&H and platelets  [name] [name] L [name] [name] 06/18/2016,12:40 PM

## 2016-06-18 NOTE — Consult Note (Signed)
ELECTROPHYSIOLOGY CONSULT NOTE    Patient ID: [name] [name] MRN: [id number], DOB/AGE: Jan 18, 1951 66 y.o.  Admit date: 06/18/2016 Date of Consult: 06/18/2016   Primary Physician: Devra Dopp, MD Primary Cardiologist: Dr. Swaziland Electrophysiologist: Dr. Ladona Ridgel  Reason for Consultation:   HPI: [name] [name] is a 66 y.o. male who is being seen today for the evaluation of PPM implant and subsequent RUE thrombus/DVT at the request of Dr. Corlis Leak.  PMHx is noted for PPM implant 02/10/16 by Dr.Apolonia Ellwood 2/2 symptomatic bradycardia and 2:1 AVBlock, CAD, CRI (III), DM, HTN, chronic CHF (diastolic).  He initially went to his PMD yesterday with c/o RUE swelling found with thrombus R subclavian and axillary and basilic veins, referred to the ER. ER MD asked EP to be on board given recent CRT-P implant 02/12/16.  The patient is feeling well, he denies any kind of CP, palpitations or SOB.  He reports that about 3 weeks ago he woke with his arm swollen, thought it would resolve but did not and saw his PMD yesterday.  LABS:  K+ 3.9 BUN/Creat 36/1.92 WBC 5.3 H/H 10/30 plts 182  Device information:  MDT CRT-P implanted 02/12/16, Dr. Ladona Ridgel, 2:1 heart block   Past Medical History:  Diagnosis Date  . Arthritis   . CKD (chronic kidney disease) stage 3, GFR 30-59 ml/min    CKD  . Coronary artery disease    s/p NSTEMI with multivessel CAD wit hoccluded RCA; myoview with inferior and inferoseptal infarct with minimal peri infarct ishemia - managed medically  . Diabetes mellitus   . Diabetes mellitus type 2 in obese (HCC)   . Dysrhythmia 03/2012   bradycardia  . Heart murmur   . History of echocardiogram    Echo 8/16:  Mild LVH, EF 50-55%, trivial AI, mild MR, severe LAE, mild RAE, PASP 37 mmHg  //  Echo 03/02/16: Mild concentric LVH, EF 50-55, normal wall motion, grade 2 diastolic dysfunction, mild MR, moderate to severe LAE, PASP 35  . Hypertension   . LV dysfunction Jan 2014   EF is 45 to 50% per echo   . Myocardial infarction (HCC) 03/2012  . Neuromuscular disorder (HCC)    TREMORS  . Obese   . PAF (paroxysmal atrial fibrillation) [facility]) Jan 2014   Brief episode - no anticoagulation     Surgical History:  Past Surgical History:  Procedure Laterality Date  . CARDIAC CATHETERIZATION  03/21/2012  . EP IMPLANTABLE DEVICE N/A 02/12/2016   Procedure: BiV Pacemaker Insertion CRT-P;  Surgeon: Marinus Maw, MD;  Location: [facility] INVASIVE CV LAB;  Service: Cardiovascular;  Laterality: N/A;  . LEFT HEART CATHETERIZATION WITH CORONARY ANGIOGRAM N/A 03/21/2012   Procedure: LEFT HEART CATHETERIZATION WITH CORONARY ANGIOGRAM;  Surgeon: Peter M Swaziland, MD;  Location: [facility] CATH LAB;  Service: Cardiovascular;  Laterality: N/A;  . RIB FRACTURE SURGERY    . s/p knee surgery for torn ligament        (Not in a hospital admission)  Inpatient Medications:   Allergies:  Allergies  Allergen Reactions  . Metformin And Related Other (See Comments)    Renal insufficiency    Social History   Social History  . Marital status: Married    Spouse name: N/A  . Number of children: N/A  . Years of education: N/A   Occupational History  . Not on file.   Social History Main Topics  . Smoking status: Former Smoker    Packs/day: 0.25    Years: 6.00    Types:  Cigarettes    Start date: 06/30/1966    Quit date: 06/29/1972  . Smokeless tobacco: Never Used  . Alcohol use No  . Drug use: No  . Sexual activity: No   Other Topics Concern  . Not on file   Social History Narrative  . No narrative on file     Family History  Problem Relation Age of Onset  . Heart disease Father      Review of Systems: All other systems reviewed and are otherwise negative except as noted above.  Physical Exam: Vitals:   06/18/16 1025 06/18/16 1026  BP:  (!) 177/89  Pulse: 72   Resp: 16   Temp: 98.1 F (36.7 C)   TempSrc: Oral   SpO2: 100%   Weight:  237 lb (107.5 kg)  Height:   (1.88 m)    GEN- The  patient is well appearing, alert and oriented x 3 today.   HEENT: normocephalic, atraumatic; sclera clear, conjunctiva pink; hearing intact; oropharynx clear; neck supple, no JVP Lymph- no cervical lymphadenopathy Lungs- CTA b/l, normal work of breathing.  No wheezes, rales, rhonchi Heart- RRR, 1/6 SM, rubs or gallops, PMI not laterally displaced GI- soft, non-tender, non-distended Extremities- no clubbing, cyanosis, or LE edema, marked RUE swelling, none on left MS- no significant deformity or atrophy Skin- warm and dry, no rash or lesion Psych- euthymic mood, full affect Neuro- no gross deficits observed  PPM site is R sided, well healed  Labs:   Lab Results  Component Value Date   WBC 5.3 06/18/2016   HGB 10.6 (L) 06/18/2016   HCT 30.6 (L) 06/18/2016   MCV 88.4 06/18/2016   PLT 182 06/18/2016    Recent Labs Lab 06/18/16 1034  NA 132*  K 3.9  CL 98*  CO2 22  BUN 36*  CREATININE 1.92*  CALCIUM 9.4  GLUCOSE 226*      Radiology/Studies: No results found.  Reviewed by myself: EKG:SR TELEMETRY: SR  03/02/16: TTE Study Conclusions - Left ventricle: The cavity size was normal. There was mild   concentric hypertrophy. Systolic function was normal. The   estimated ejection fraction was in the range of 50% to 55%. Wall   motion was normal; there were no regional wall motion   abnormalities. Features are consistent with a pseudonormal left   ventricular filling pattern, with concomitant abnormal relaxation   and increased filling pressure (grade 2 diastolic dysfunction). - Mitral valve: There was mild regurgitation directed centrally. - Left atrium: The atrium was moderately to severely dilated. - Pulmonary arteries: Systolic pressure was mildly increased. PA   peak pressure: 35 mm Hg (S).   Assessment and Plan:   1. RUE DVT, extensive thrombus    Vascular service is on board  2. PPM     Implanted December 2017     Site is well healed, no objection to full  anticoagulation acutely and/or long term     At the time of implant, was decided to utilize R side secondary left sided swelling (reported by the patient)         Signed, Francis Dowse, PA-C 06/18/2016 12:33 PM  EP Attending  Patient seen and examined. Agree with above. The patient has a subacute DVT after PPM insertion over 3 months ago. He has swelling in his right arm. Doppler scan has been reviewed. I have recommended IV heparin followed by transition to either Eliquis or Xarelto for 3 months. He can be discharged home once he has been  transition to [facility]. I have asked the patient to keep his right arm elevated as much as possible.   Leonia Reeves.D.

## 2016-06-18 NOTE — ED Provider Notes (Signed)
[facility]EMERGENCY DEPT Provider Note   CSN: [id number] Arrival date & time: 06/18/16  4098     History   Chief Complaint Chief Complaint  Patient presents with  . Arm Swelling    HPI [name] [name] is a 66 y.o. male.  HPI [name] [name] is a 66 y.o. male with PMH of CAD, ICD, HTN, HLD, DM, CKD, PAF, and bradycardia with h/o mobitz II AV block with pacemaker on plavix presentign with DVT.   Pt went to family medicine yesterday for RUE swelling. US showed  thrombus and lack of flow in the right subclavian and axillary veins. There is also thrombus and lack of flow in the right basilic vein. Right brachial and cephalic veins are patent.    Past Medical History:  Diagnosis Date  . Arthritis   . CKD (chronic kidney disease) stage 3, GFR 30-59 ml/min    CKD  . Coronary artery disease    s/p NSTEMI with multivessel CAD wit hoccluded RCA; myoview with inferior and inferoseptal infarct with minimal peri infarct ishemia - managed medically  . Diabetes mellitus   . Diabetes mellitus type 2 in obese (HCC)   . Dysrhythmia 03/2012   bradycardia  . Heart murmur   . History of echocardiogram    Echo 8/16:  Mild LVH, EF 50-55%, trivial AI, mild MR, severe LAE, mild RAE, PASP 37 mmHg  //  Echo 03/02/16: Mild concentric LVH, EF 50-55, normal wall motion, grade 2 diastolic dysfunction, mild MR, moderate to severe LAE, PASP 35  . Hypertension   . LV dysfunction Jan 2014   EF is 45 to 50% per echo  . Myocardial infarction (HCC) 03/2012  . Neuromuscular disorder (HCC)    TREMORS  . Obese   . PAF (paroxysmal atrial fibrillation) [facility]) Jan 2014   Brief episode - no anticoagulation    Patient Active Problem List   Diagnosis Date Noted  . DVT (deep venous thrombosis) (HCC) 06/18/2016  . Mobitz type 2 second degree atrioventricular block 02/12/2016  . Bradycardia 03/28/2012  . Mobitz type II atrioventricular block 03/28/2012  . Pre-syncope 03/28/2012  . PAF (paroxysmal atrial fibrillation) (HCC)  03/28/2012  . CAD (coronary artery disease) 03/28/2012  . Cardiomyopathy, ischemic 03/28/2012  . Type 2 diabetes mellitus (HCC) 03/28/2012  . Hyperlipidemia 03/28/2012  . Mobitz type 1 second degree atrioventricular block 03/23/2012  . Non-ST elevation myocardial infarction (NSTEMI), subendocardial infarction, initial episode of care (HCC) 03/23/2012  . Hypertension   . Chronic kidney disease, stage III (moderate)   . Obese   . Dysrhythmia 03/01/2012    Past Surgical History:  Procedure Laterality Date  . CARDIAC CATHETERIZATION  03/21/2012  . EP IMPLANTABLE DEVICE N/A 02/12/2016   Procedure: BiV Pacemaker Insertion CRT-P;  Surgeon: Marinus Maw, MD;  Location: [facility] INVASIVE CV LAB;  Service: Cardiovascular;  Laterality: N/A;  . LEFT HEART CATHETERIZATION WITH CORONARY ANGIOGRAM N/A 03/21/2012   Procedure: LEFT HEART CATHETERIZATION WITH CORONARY ANGIOGRAM;  Surgeon: Peter M Swaziland, MD;  Location: [facility] CATH LAB;  Service: Cardiovascular;  Laterality: N/A;  . RIB FRACTURE SURGERY    . s/p knee surgery for torn ligament         Home Medications    Prior to Admission medications   Medication Sig Start Date End Date Taking? Authorizing Provider  amLODipine (NORVASC) 10 MG tablet Take 10 mg by mouth daily.  07/12/14  Yes Historical Provider, MD  atorvastatin (LIPITOR) 80 MG tablet Take 1 tablet (80 mg total) by mouth  daily at 6 PM. 04/25/12  Yes Rosalio Macadamia, NP  BAYER MICROLET LANCETS lancets Use as instructed to check blood sugar once daily dx code 250.42 09/13/13  Yes Reather Littler, MD  clopidogrel (PLAVIX) 75 MG tablet Take 1 tablet (75 mg total) by mouth daily. 04/25/12  Yes Rosalio Macadamia, NP  Dulaglutide 1.5 MG/0.5ML SOPN Inject 0.5 mLs into the skin once a week. 04/16/16  Yes Historical Provider, MD  furosemide (LASIX) 40 MG tablet Take 40 mg by mouth every other day.   Yes Historical Provider, MD  glimepiride (AMARYL) 1 MG tablet Take 1 mg by mouth daily. 06/09/16  Yes Historical  Provider, MD  glucose blood (ONETOUCH VERIO) test strip Use as instructed to check blood sugar once a day dx code E11.29 08/27/14  Yes Reather Littler, MD  isosorbide mononitrate (IMDUR) 60 MG 24 hr tablet Take 1 tablet (60 mg total) by mouth daily. 03/28/12  Yes Ok Anis, NP  losartan (COZAAR) 25 MG tablet Take 1 tablet (25 mg total) by mouth daily. NEED OV. 06/16/15  Yes Peter M [place]azil[place], MD  Multiple Vitamin (MULTIVITAMIN WITH MINERALS) TABS Take 1 tablet by mouth daily.   Yes Historical Provider, MD    Family History Family History  Problem Relation Age of Onset  . Heart disease Father     Social History Social History  Substance Use Topics  . Smoking status: Former Smoker    Packs/day: 0.25    Years: 6.00    Types: Cigarettes    Start date: 06/30/1966    Quit date: 06/29/1972  . Smokeless tobacco: Never Used  . Alcohol use No     Allergies   Metformin and related   Review of Systems Review of Systems  Constitutional: Negative for activity change.  Respiratory: Negative for shortness of breath.   Cardiovascular: Negative for chest pain.  Gastrointestinal: Negative for abdominal pain.     Physical Exam Updated Vital Signs BP (!) 167/71 (BP Location: Left Arm)   Pulse (!) 31   Temp 97.9 F (36.6 C) (Oral)   Resp 20   Ht  (1.88 m)   Wt 237 lb (107.5 kg)   SpO2 100%   BMI 30.43 kg/m   Physical Exam  Constitutional: He is oriented to person, place, and time. He appears well-nourished.  HENT:  Head: Normocephalic and atraumatic.  Eyes: Conjunctivae and EOM are normal.  Cardiovascular: Normal rate and regular rhythm.   No murmur heard. Pulmonary/Chest: Effort normal and breath sounds normal. No respiratory distress. He has no wheezes.  Musculoskeletal:  Right arm with significant swelling  Neurological: He is oriented to person, place, and time.  Skin: Skin is warm and dry. He is not diaphoretic.  Psychiatric: He has a normal mood and affect. His  behavior is normal.     ED Treatments / Results  Labs (all labs ordered are listed, but only abnormal results are displayed) Labs Reviewed  BASIC METABOLIC PANEL - Abnormal; Notable for the following:       Result Value   Sodium 132 (*)    Chloride 98 (*)    Glucose, Bld 226 (*)    BUN 36 (*)    Creatinine, Ser 1.92 (*)    GFR calc non Af Amer 35 (*)    GFR calc Af Amer 40 (*)    All other components within normal limits  CBC - Abnormal; Notable for the following:    RBC 3.46 (*)  Hemoglobin 10.6 (*)    HCT 30.6 (*)    All other components within normal limits  GLUCOSE, CAPILLARY - Abnormal; Notable for the following:    Glucose-Capillary 218 (*)    All other components within normal limits  PROTIME-INR  HEPARIN LEVEL (UNFRACTIONATED)  HEPARIN LEVEL (UNFRACTIONATED)  CBC  BASIC METABOLIC PANEL    EKG  EKG Interpretation None       Radiology No results found.  Procedures Procedures (including critical care time)  Medications Ordered in ED Medications  heparin ADULT infusion 100 units/mL (25000 units/254mL sodium chloride 0.45%) (1,600 Units/hr Intravenous Transfusing/Transfer 06/18/16 1342)  atorvastatin (LIPITOR) tablet 80 mg (80 mg Oral Given 06/18/16 1702)  amLODipine (NORVASC) tablet 10 mg (10 mg Oral Given 06/18/16 1535)  losartan (COZAAR) tablet 25 mg (25 mg Oral Given 06/18/16 1536)  isosorbide mononitrate (IMDUR) 24 hr tablet 60 mg (60 mg Oral Given 06/18/16 1535)  sodium chloride flush (NS) 0.9 % injection 3 mL (3 mLs Intravenous Given 06/18/16 1536)  acetaminophen (TYLENOL) tablet 650 mg (not administered)    Or  acetaminophen (TYLENOL) suppository 650 mg (not administered)  senna-docusate (Senokot-S) tablet 1 tablet (not administered)  promethazine (PHENERGAN) tablet 12.5 mg (not administered)  insulin aspart (novoLOG) injection 0-15 Units (5 Units Subcutaneous Given 06/18/16 1703)  insulin aspart (novoLOG) injection 0-5 Units (not administered)    heparin bolus via infusion 5,000 Units (5,000 Units Intravenous Bolus from Bag 06/18/16 1308)     Initial Impression / Assessment and Plan / ED Course  I have reviewed the triage vital signs and the nursing notes.  Pertinent labs & imaging results that were available during my care of the patient were reviewed by me and considered in my medical decision making (see chart for details).     [name] [name] is a 66 y.o. male with PMH of CAD, ICD, HTN, HLD, DM, CKD, PAF, and bradycardia with h/o mobitz II AV block with pacemaker on plavix presentign with large UE DVT.  Pt here with US showed  thrombus and lack of flow in the right subclavian and axillary veins. There is also thrombus and lack of flow in the right basilic vein. Right brachial and cephalic veins are patent.     Given the extensive nature, will discuss with vascular.  Given that it is the same side as pacer, will touch base with Dr. Bruna Potter coverage.   Heparin started.   CRITICAL CARE Performed by: Arlana Hove Total critical care time: 35 minutes Critical care time was exclusive of separately billable procedures and treating other patients. Critical care was necessary to treat or prevent imminent or life-threatening deterioration. Critical care was time spent personally by me on the following activities: development of treatment plan with patient and/or surrogate as well as nursing, discussions with consultants, evaluation of patient's response to treatment, examination of patient, obtaining history from patient or surrogate, ordering and performing treatments and interventions, ordering and review of laboratory studies, ordering and review of radiographic studies, pulse oximetry and re-evaluation of patient's condition.     Final Clinical Impressions(s) / ED Diagnoses   Final diagnoses:  None    New Prescriptions Current Discharge Medication List       Brylon Brenning Randall An, MD 06/18/16 1725

## 2016-06-18 NOTE — Progress Notes (Signed)
   Per patient request, Advanced Directive (AD) documentation left at bedside.  If/when patient decides to move forward, please page on-call chaplain or contact the spiritual care department (Mon-Fri 9AM-3PM).  Will follow, as needed.  - Rev. Chaplain Kipp Brood MDiv ThM

## 2016-06-18 NOTE — Progress Notes (Signed)
Noted - patient might be placed on Xarelto/Eliquis; coupon cards for NOAC placed on the patient's chart if needed. Abelino Derrick [facility] [phone]

## 2016-06-18 NOTE — ED Triage Notes (Addendum)
Pt was sent here by pcp after [place] for R arm swelling showed thrombus to and decreased blood flow in R subclavian, R axillary and R basilic vein.  Pacemaker placed in Dec by Dr Ladona Ridgel.  Denies any pain, sob.

## 2016-06-18 NOTE — ED Notes (Signed)
Attempted report 

## 2016-06-18 NOTE — Consult Note (Signed)
[facility] NOTE   MRN : [id number]  Reason for Consult: Right UE edema secondary to DVT Referring Physician: ED  History of Present Illness: 66 y/o male That has a 3 week history of sudden right UE swelling.  He reports no pain or skin discoloration.  He thought he was having an allergic reaction to food or possibly a spider bite and though it would go away on its own.  He was seen at his primary care physician's office eon Tuesday 06/15/2016 and was placed on oral antibiotics.  He was scheduled for a right UE venous duplex and this showed right UE thrombus in the axillary, right subclavian, and basilic veins.  He has patent brachial and cephalic veins.   Past medical history includesbradycardia with h/o mobitz II AV block with pacemaker on plavix.    Insertion of a dual-chamber biventricular pacemaker secondary to Symptomatic 2-1 heart block, with chronic systolic heart failure ejection fraction 40-50%.  The leads where placed on the right instead of the left due to anatomy.  HTN, hyperlipidemia, DM type II, CAD with history of MI, PAF, and CKD with Cr of 1.92.     Current Facility-Administered Medications  Medication Dose Route Frequency Provider Last Rate Last Dose  . heparin ADULT infusion 100 units/mL (25000 units/248mL sodium chloride 0.45%)  1,600 Units/hr Intravenous Continuous Burns Spain, MD      . heparin bolus via infusion 5,000 Units  5,000 Units Intravenous Once Burns Spain, MD       Current Outpatient Prescriptions  Medication Sig Dispense Refill  . amLODipine (NORVASC) 10 MG tablet Take 10 mg by mouth daily.     Marland Kitchen atorvastatin (LIPITOR) 80 MG tablet Take 1 tablet (80 mg total) by mouth daily at 6 PM. 30 tablet 6  . BAYER MICROLET LANCETS lancets Use as instructed to check blood sugar once daily dx code 250.42 100 each 1  . clopidogrel (PLAVIX) 75 MG tablet Take 1 tablet (75 mg total) by mouth daily. 30 tablet 6  .  Dulaglutide 1.5 MG/0.5ML SOPN Inject 0.5 mLs into the skin once a week.    . furosemide (LASIX) 40 MG tablet Take 40 mg by mouth every other day.    Marland Kitchen glimepiride (AMARYL) 1 MG tablet Take 1 mg by mouth daily.    Marland Kitchen glucose blood (ONETOUCH VERIO) test strip Use as instructed to check blood sugar once a day dx code E11.29 100 each 1  . isosorbide mononitrate (IMDUR) 60 MG 24 hr tablet Take 1 tablet (60 mg total) by mouth daily. 30 tablet 6  . losartan (COZAAR) 25 MG tablet Take 1 tablet (25 mg total) by mouth daily. NEED OV. 90 tablet 0  . Multiple Vitamin (MULTIVITAMIN WITH MINERALS) TABS Take 1 tablet by mouth daily.      Pt meds include: Statin :Yes Betablocker: No ASA: No Other anticoagulants/antiplatelets: Plavix  Past Medical History:  Diagnosis Date  . Arthritis   . CKD (chronic kidney disease) stage 3, GFR 30-59 ml/min    CKD  . Coronary artery disease    s/p NSTEMI with multivessel CAD wit hoccluded RCA; myoview with inferior and inferoseptal infarct with minimal peri infarct ishemia - managed medically  . Diabetes mellitus   . Diabetes mellitus type 2 in obese (HCC)   . Dysrhythmia 03/2012   bradycardia  . Heart murmur   . History of echocardiogram    Echo 8/16:  Mild LVH, EF 50-55%, trivial AI,  mild MR, severe LAE, mild RAE, PASP 37 mmHg  //  Echo 03/02/16: Mild concentric LVH, EF 50-55, normal wall motion, grade 2 diastolic dysfunction, mild MR, moderate to severe LAE, PASP 35  . Hypertension   . LV dysfunction Jan 2014   EF is 45 to 50% per echo  . Myocardial infarction (HCC) 03/2012  . Neuromuscular disorder (HCC)    TREMORS  . Obese   . PAF (paroxysmal atrial fibrillation) [facility]) Jan 2014   Brief episode - no anticoagulation    Past Surgical History:  Procedure Laterality Date  . CARDIAC CATHETERIZATION  03/21/2012  . EP IMPLANTABLE DEVICE N/A 02/12/2016   Procedure: BiV Pacemaker Insertion CRT-P;  Surgeon: Marinus Maw, MD;  Location: [facility] INVASIVE CV LAB;   Service: Cardiovascular;  Laterality: N/A;  . LEFT HEART CATHETERIZATION WITH CORONARY ANGIOGRAM N/A 03/21/2012   Procedure: LEFT HEART CATHETERIZATION WITH CORONARY ANGIOGRAM;  Surgeon: Peter M Swaziland, MD;  Location: [facility] CATH LAB;  Service: Cardiovascular;  Laterality: N/A;  . RIB FRACTURE SURGERY    . s/p knee surgery for torn ligament      Social History Social History  Substance Use Topics  . Smoking status: Former Smoker    Packs/day: 0.25    Years: 6.00    Types: Cigarettes    Start date: 06/30/1966    Quit date: 06/29/1972  . Smokeless tobacco: Never Used  . Alcohol use No    Family History Family History  Problem Relation Age of Onset  . Heart disease Father   CAD multiple male relatives  Allergies  Allergen Reactions  . Metformin And Related Other (See Comments)    Renal insufficiency     REVIEW OF SYSTEMS  General:  Weight loss,  Fever,  chills Neurologic:  Dizziness,  Blackouts,  Seizure  Stroke,  "Mini stroke",  Slurred speech,  Temporary blindness;  weakness in arms or legs,  Hoarseness  Dysphagia Cardiac:  Chest pain/pressure,  Shortness of breath at rest  Shortness of breath with exertion, [x ] Hx of PAF, Brady cardia Atrial fibrillation or irregular heartbeat  Vascular:  Pain in legs with walking,  Pain in legs at rest,  Pain in legs at night,   Non-healing ulcer, [x ] Blood clot in vein/DVT,   Pulmonary:  Home oxygen,  Productive cough,  Coughing up blood,  Asthma,   Wheezing  COPD Musculoskeletal:   Arthritis,  Low back pain,  Joint pain Hematologic:  Easy Bruising,  Anemia;  Hepatitis Gastrointestinal:  Blood in stool,  Gastroesophageal Reflux/heartburn, Urinary: [x ] chronic Kidney disease,  on HD -  MWF or  TTHS,  Burning with urination,  Difficulty urinating Skin:  Rashes,  Wounds Psychological:  Anxiety,   Depression  Physical Examination Vitals:   06/18/16 1025 06/18/16 1026  BP:  (!) 177/89  Pulse: 72   Resp: 16   Temp: 98.1 F (36.7 C)   TempSrc: Oral   SpO2: 100%   Weight:  237 lb (107.5 kg)  Height:   (1.88 m)   Body mass index is 30.43 kg/m.  General:  WDWN in NAD Gait: Normal HENT: WNL Eyes: Pupils equal Pulmonary: normal non-labored breathing , without Rales, rhonchi,  wheezing Cardiac: RRR, without  Murmurs,  rubs or gallops; No carotid bruits Abdomen: soft, NT, no masses Skin: no rashes, ulcers noted;  no Gangrene , no cellulitis; no open wounds;   Vascular Exam/Pulses:Palpable radial, brachial, femoral, DP/PT 2+ pulses   Musculoskeletal: no muscle wasting or atrophy; positive moderate edema Right UE Neurologic: A&O X 3; Appropriate Affect ;  SENSATION: normal; MOTOR FUNCTION: 5/5 Symmetric, decreased function right UE secondary to edema.  Motor grossly intact, range of motion is affected. Speech is fluent/normal   Significant Diagnostic Studies: CBC Lab Results  Component Value Date   WBC 5.3 06/18/2016   HGB 10.6 (L) 06/18/2016   HCT 30.6 (L) 06/18/2016   MCV 88.4 06/18/2016   PLT 182 06/18/2016    BMET    Component Value Date/Time   NA 132 (L) 06/18/2016 1034   K 3.9 06/18/2016 1034   CL 98 (L) 06/18/2016 1034   CO2 22 06/18/2016 1034   GLUCOSE 226 (H) 06/18/2016 1034   BUN 36 (H) 06/18/2016 1034   CREATININE 1.92 (H) 06/18/2016 1034   CREATININE 1.29 (H) 02/16/2016 1553   CALCIUM 9.4 06/18/2016 1034   GFRNONAA 35 (L) 06/18/2016 1034   GFRAA 40 (L) 06/18/2016 1034   Estimated Creatinine Clearance: 49.4 mL/min (A) (by C-G formula based on SCr of 1.92 mg/dL (H)).  COAG Lab Results  Component Value Date   INR 1.05 06/18/2016   INR 1.13 03/21/2012     Non-Invasive Vascular Imaging:  [facility] Venous duplex 06/18/2016  "There is thrombus and lack of flow in the right subclavian and axillary veins.  There is also thrombus and  lack of flow in the right basilic vein.  The brachial and cephalic veins are patient.  ASSESSMENT/PLAN:  Right UE DVT possibly due to recent placement of right sided pacemaker.  There is no other placement chose per cardiology PA.   Treatment has been started with heparin drip and further work up for other possible sources.    He will likely be placed on long term anticoagulation in the near future Coumadin/Xarelto/Eliquis.  Clinton Gallant [facility] 06/18/2016 12:59 PM  I have independently interviewed patient and agree with PA assessment and plan above. Extensive dvt with pacemaker leads now on heparin. Asked nurse to get pillows for elevation but other than edema he is really asymptomatic. When heparin is therapeutic can get gently compression with ace or sleeve and transition to oral agent.   Justice Aguirre C. Randie Heinz, MD [facility] [place] Office: [phone] Pager: [phone]

## 2016-06-18 NOTE — H&P (Signed)
Date: 06/18/2016               Patient Name:  [name] [name] MRN: [id number]  DOB: Mar 01, 1951 Age / Sex: 66 y.o., male   PCP: Devra Dopp, MD         Medical Service: Internal Medicine Teaching Service         Attending Physician: Dr. Burns Spain, MD    First Contact: Dr. Carolynn Comment Pager: [phone]  Second Contact: Dr. Deneise Lever Pager: [phone]       After Hours (After 5p /  First Contact Pager: [phone]  Weekends / Holidays): Second Contact Pager: [phone]   Chief Complaint: RUE swelling  History of Present Illness: [name] is a 66 y.o. male with a h/o of CAD, ICD, HTN, HLD, DM, CKD, PAF, and bradycardia with h/o mobitz II AV block with pacemaker on plavix who presents with swelling of RUE found to have DVT.  Pt presented with 3 wk h/o R arm swelling. Pt denies any inciting factors, denies specifically trauma, insect bite, infection. He reports that the swelling was constant and non-painful, but after 3 weeks decided to be evaluated by his PCP. His PCP ordered right upper extremity Doppler ultrasound which was notable for subclavian, axillary, basilic vein DVT. Hemodynamically stable without significant complaints. He had normal PT/INR. Emergency department contacted vascular surgery given the extent of DVT and cardiology given his recent pacemaker placement in December 2017 which is located on his right upper chest. He was admitted to the IMTS.  Meds: Current Facility-Administered Medications  Medication Dose Route Frequency Provider Last Rate Last Dose  . heparin ADULT infusion 100 units/mL (25000 units/229mL sodium chloride 0.45%)  1,600 Units/hr Intravenous Continuous Burns Spain, MD 16 mL/hr at 06/18/16 1303 1,600 Units/hr at 06/18/16 1303   Current Outpatient Prescriptions  Medication Sig Dispense Refill  . amLODipine (NORVASC) 10 MG tablet Take 10 mg by mouth daily.     Marland Kitchen atorvastatin (LIPITOR) 80 MG tablet Take 1 tablet (80 mg total) by mouth daily  at 6 PM. 30 tablet 6  . BAYER MICROLET LANCETS lancets Use as instructed to check blood sugar once daily dx code 250.42 100 each 1  . clopidogrel (PLAVIX) 75 MG tablet Take 1 tablet (75 mg total) by mouth daily. 30 tablet 6  . Dulaglutide 1.5 MG/0.5ML SOPN Inject 0.5 mLs into the skin once a week.    . furosemide (LASIX) 40 MG tablet Take 40 mg by mouth every other day.    Marland Kitchen glimepiride (AMARYL) 1 MG tablet Take 1 mg by mouth daily.    Marland Kitchen glucose blood (ONETOUCH VERIO) test strip Use as instructed to check blood sugar once a day dx code E11.29 100 each 1  . isosorbide mononitrate (IMDUR) 60 MG 24 hr tablet Take 1 tablet (60 mg total) by mouth daily. 30 tablet 6  . losartan (COZAAR) 25 MG tablet Take 1 tablet (25 mg total) by mouth daily. NEED OV. 90 tablet 0  . Multiple Vitamin (MULTIVITAMIN WITH MINERALS) TABS Take 1 tablet by mouth daily.     Allergies: Allergies as of 06/18/2016 - Review Complete 06/18/2016  Allergen Reaction Noted  . Metformin and related Other (See Comments) 09/13/2013   Past Medical History:  Diagnosis Date  . Arthritis   . CKD (chronic kidney disease) stage 3, GFR 30-59 ml/min    CKD  . Coronary artery disease    s/p NSTEMI with multivessel CAD wit hoccluded RCA; myoview with inferior  and inferoseptal infarct with minimal peri infarct ishemia - managed medically  . Diabetes mellitus   . Diabetes mellitus type 2 in obese (HCC)   . Dysrhythmia 03/2012   bradycardia  . Heart murmur   . History of echocardiogram    Echo 8/16:  Mild LVH, EF 50-55%, trivial AI, mild MR, severe LAE, mild RAE, PASP 37 mmHg  //  Echo 03/02/16: Mild concentric LVH, EF 50-55, normal wall motion, grade 2 diastolic dysfunction, mild MR, moderate to severe LAE, PASP 35  . Hypertension   . LV dysfunction Jan 2014   EF is 45 to 50% per echo  . Myocardial infarction (HCC) 03/2012  . Neuromuscular disorder (HCC)    TREMORS  . Obese   . PAF (paroxysmal atrial fibrillation) (HCC) Jan 2014   Brief  episode - no anticoagulation   Family History: Pt family history includes Heart disease in his father.  Social History: Pt  reports that he quit smoking about 44 years ago. His smoking use included Cigarettes. He started smoking about 50 years ago. He has a 1.50 pack-year smoking history. He has never used smokeless tobacco. He reports that he does not drink alcohol or use drugs.  Review of Systems: A complete ROS was negative except as per HPI. Review of Systems  Constitutional: Negative for chills, fever and weight loss.  Eyes: Negative for blurred vision.  Respiratory: Negative for cough and shortness of breath.   Cardiovascular: Negative for chest pain, palpitations, orthopnea and leg swelling.  Gastrointestinal: Negative for abdominal pain, constipation, diarrhea, nausea and vomiting.  Genitourinary: Negative for dysuria, frequency and urgency.  Musculoskeletal: Negative for myalgias.  Skin: Negative for rash.  Neurological: Negative for dizziness, tremors and headaches.  Endo/Heme/Allergies: Negative for polydipsia.  Psychiatric/Behavioral: The patient is not nervous/anxious.    Physical Exam: Vitals:   06/18/16 1230 06/18/16 1245 06/18/16 1300 06/18/16 1315  BP:   (!) 160/98   Pulse: 65 66 62 62  Resp: 14 18 (!) 24 (!) 23  Temp:      TempSrc:      SpO2: 100% 100% 100% 100%  Weight:      Height:       Physical Exam  Constitutional: He is oriented to person, place, and time. He appears well-developed and well-nourished. He is cooperative. No distress.  HENT:  Head: Normocephalic and atraumatic.  Right Ear: Hearing normal.  Left Ear: Hearing normal.  Nose: Nose normal.  Mouth/Throat: Mucous membranes are normal.  Cardiovascular: Normal rate, regular rhythm, S1 normal, S2 normal and intact distal pulses.  Exam reveals no gallop.   No murmur heard. Pulmonary/Chest: Effort normal and breath sounds normal. No respiratory distress. He has no wheezes. He has no rhonchi. He  has no rales. He exhibits no tenderness.  Abdominal: Soft. Normal appearance and bowel sounds are normal. He exhibits no ascites. There is no hepatosplenomegaly. There is no tenderness.  Musculoskeletal: He exhibits edema (RUE to the shoulder 2-3+, no distention of neck veins). He exhibits no tenderness.  Neurological: He is alert and oriented to person, place, and time. He has normal strength.  Skin: Skin is warm, dry and intact. He is not diaphoretic.  Psychiatric: He has a normal mood and affect. His speech is normal and behavior is normal.   Labs: CBC:  Recent Labs Lab 06/18/16 1034  WBC 5.3  HGB 10.6*  HCT 30.6*  MCV 88.4  PLT 182   Basic Metabolic Panel:  Recent Labs Lab 06/18/16 1034  NA 132*  K 3.9  CL 98*  CO2 22  GLUCOSE 226*  BUN 36*  CREATININE 1.92*  CALCIUM 9.4   Coagulation Studies:  Recent Labs  06/18/16 1034  LABPROT 13.7  INR 1.05   CBG: Lab Results  Component Value Date   HGBA1C 10.3 (H) 03/20/2015   EKG: Personally reviewed. Sinus w/ no new ST changes.  ECHOCARDIOGRAM COMPLETE   Collection Time: 03/02/16  3:55 PM    Study Conclusions - Left ventricle: The cavity size was normal. There was mild concentric hypertrophy. Systolic function was normal. The estimated ejection fraction was in the range of 50% to 55%. Wall motion was normal; there were no regional wall motion abnormalities. Features are consistent with a pseudonormal left ventricular filling pattern, with concomitant abnormal relaxation and increased filling pressure (grade 2 diastolic dysfunction). - Mitral valve: There was mild regurgitation directed centrally. - Left atrium: The atrium was moderately to severely dilated. - Pulmonary arteries: Systolic pressure was mildly increased. PA peak pressure: 35 mm Hg (S).   RUE Doppler US at [facility] 06/17/16: IMPRESSION:Deep venous thrombosis right subclavian and axillary veins. Thrombosis right basilic vein.  Assessment &  Plan by Problem: Active Problems:   Chronic kidney disease, stage III (moderate)   Mobitz type II atrioventricular block   CAD (coronary artery disease)   Type 2 diabetes mellitus (HCC)   DVT (deep venous thrombosis) [facility])  [name] is a 66 y.o. male with CAD, ICD, HTN, HLD, DM, CKD, PAF, and bradycardia with h/o mobitz II AV block with pacemaker on plavix who presents with swelling of RUE found to have DVT.  1) RUE DVT: Significant DVT. Vascular c/s, appreciate recs. Pt w/o pain. Will start heparin and recommend elevation. No h/o clots, no obvious provoking factors other than pacemaker placement in 01/2016. Will likely need long-term AC. - heparin ggt - elevate arm - gentle compression w/ ACE once heparin is therapeutic - Vascular surgery c/s, appreciate recs  2) AV Block s/p BiV pacemaker: placed in 01/2016 by Dr. Mayford Knife in R upper chest. Interrogated w/ good function. On Plavix. - Cardiology c/s appreciate recs,   3) HTN: Hypertensive on admission in the setting of missed meds this AM. Home medications: amlodipine , losartan , imdur , furosemide  QOD. - restart home meds - hold lasix for now  4) DM: Last A1c in our system 03/2015 10.5. Random BG 226. Home meds: glimepiride  qD, dulaglutide 0.5mg  qWk. Hold home meds. - SSI-m qAC/HS  DVT PPx - heparin  Code Status - Full  Consults Placed - Vascular Surgery, Cardiology  Dispo: Admit patient to Inpatient with expected length of stay greater than 2 midnights.  Signed: Carolynn Comment, MD 06/18/2016, 1:48 PM  Pager: [phone]

## 2016-06-18 NOTE — Progress Notes (Signed)
ANTICOAGULATION CONSULT NOTE - Follow Up Consult  Pharmacy Consult for Heparin Indication: DVT  Allergies  Allergen Reactions  . Metformin And Related Other (See Comments)    Renal insufficiency    Patient Measurements: Height:  (188 cm) Weight: 237 lb (107.5 kg) IBW/kg (Calculated) : 82.2 Heparin Dosing Weight: 104.2 kg  Vital Signs: Temp: 97.9 F (36.6 C) (04/20 1406) Temp Source: Oral (04/20 1406) BP: 167/71 (04/20 1406) Pulse Rate: 31 (04/20 1406)  Labs:  Recent Labs  06/18/16 1034 06/18/16 1923  HGB 10.6*  --   HCT 30.6*  --   PLT 182  --   LABPROT 13.7  --   INR 1.05  --   HEPARINUNFRC  --  0.36  CREATININE 1.92*  --     Estimated Creatinine Clearance: 49.4 mL/min (A) (by C-G formula based on SCr of 1.92 mg/dL (H)).   Medications:  Infusions:  . heparin 1,600 Units/hr (06/18/16 1303)    Assessment: 66 year old male on IV heparin for DVT in RUE.   Heparin level is therapeutic at 0.36 on 1600 units/hr.  This is the first level and may be influenced by bolus dose.  Elevated SCr, Low Hgb.   Goal of Therapy:  Heparin level 0.3-0.7 units/ml Monitor platelets by anticoagulation protocol: Yes   Plan:  Continue heparin at 1600 units/hr. Recheck level in 6 hours.  Daily heparin level and CBC.  Monitor for signs of bleeding.  Follow-up change to oral anticoagulation.   Link Snuffer, PharmD, BCPS Clinical Pharmacist Clinical phone 06/18/2016 until 2300 PM - [phone] After hours, please call [phone] 06/18/2016,8:34 PM

## 2016-06-19 DIAGNOSIS — I441 Atrioventricular block, second degree: Secondary | ICD-10-CM

## 2016-06-19 DIAGNOSIS — I2510 Atherosclerotic heart disease of native coronary artery without angina pectoris: Secondary | ICD-10-CM

## 2016-06-19 DIAGNOSIS — Y712 Prosthetic and other implants, materials and accessory cardiovascular devices associated with adverse incidents: Secondary | ICD-10-CM

## 2016-06-19 DIAGNOSIS — Z888 Allergy status to other drugs, medicaments and biological substances status: Secondary | ICD-10-CM

## 2016-06-19 DIAGNOSIS — E1122 Type 2 diabetes mellitus with diabetic chronic kidney disease: Secondary | ICD-10-CM

## 2016-06-19 DIAGNOSIS — T82867A Thrombosis of cardiac prosthetic devices, implants and grafts, initial encounter: Secondary | ICD-10-CM

## 2016-06-19 DIAGNOSIS — Z7984 Long term (current) use of oral hypoglycemic drugs: Secondary | ICD-10-CM

## 2016-06-19 DIAGNOSIS — N183 Chronic kidney disease, stage 3 (moderate): Secondary | ICD-10-CM

## 2016-06-19 LAB — CBC
HEMATOCRIT: 28.7 % — AB (ref 39.0–52.0)
Hemoglobin: 9.7 g/dL — ABNORMAL LOW (ref 13.0–17.0)
MCH: 29.8 pg (ref 26.0–34.0)
MCHC: 33.8 g/dL (ref 30.0–36.0)
MCV: 88.3 fL (ref 78.0–100.0)
Platelets: 177 10*3/uL (ref 150–400)
RBC: 3.25 MIL/uL — ABNORMAL LOW (ref 4.22–5.81)
RDW: 13.6 % (ref 11.5–15.5)
WBC: 5.4 10*3/uL (ref 4.0–10.5)

## 2016-06-19 LAB — BASIC METABOLIC PANEL
Anion gap: 11 (ref 5–15)
BUN: 32 mg/dL — ABNORMAL HIGH (ref 6–20)
CO2: 21 mmol/L — ABNORMAL LOW (ref 22–32)
CREATININE: 1.73 mg/dL — AB (ref 0.61–1.24)
Calcium: 8.8 mg/dL — ABNORMAL LOW (ref 8.9–10.3)
Chloride: 102 mmol/L (ref 101–111)
GFR calc Af Amer: 46 mL/min — ABNORMAL LOW (ref >60)
GFR calc non Af Amer: 39 mL/min — ABNORMAL LOW (ref >60)
Glucose, Bld: 137 mg/dL — ABNORMAL HIGH (ref 65–99)
Potassium: 3.8 mmol/L (ref 3.5–5.1)
Sodium: 134 mmol/L — ABNORMAL LOW (ref 135–145)

## 2016-06-19 LAB — GLUCOSE, CAPILLARY: Glucose-Capillary: 156 mg/dL — ABNORMAL HIGH (ref 65–99)

## 2016-06-19 LAB — HEPARIN LEVEL (UNFRACTIONATED): Heparin Unfractionated: 0.33 IU/mL (ref 0.30–0.70)

## 2016-06-19 MED ORDER — RIVAROXABAN 15 MG PO TABS
15.0000 mg | ORAL_TABLET | Freq: Two times a day (BID) | ORAL | Status: DC
  Administered 2016-06-19: 15 mg via ORAL
  Filled 2016-06-19: qty 1

## 2016-06-19 MED ORDER — RIVAROXABAN 15 MG PO TABS: 15.0000 mg | ORAL_TABLET | Freq: Two times a day (BID) | ORAL | 0 refills | Status: DC

## 2016-06-19 MED ORDER — RIVAROXABAN 20 MG PO TABS
20.0000 mg | ORAL_TABLET | Freq: Every day | ORAL | Status: DC
Start: 2016-07-10 — End: 2016-06-19

## 2016-06-19 MED ORDER — RIVAROXABAN 20 MG PO TABS: 20.0000 mg | ORAL_TABLET | Freq: Every day | ORAL | 0 refills | Status: DC

## 2016-06-19 NOTE — Discharge Summary (Signed)
Name: [name] [name] MRN: [id number] DOB: [birth year] 66 y.o. PCP: Devra Dopp, MD  Date of Admission: 06/18/2016 11:21 AM Date of Discharge: 06/19/2016 Attending Physician: Burns Spain, MD  Discharge Diagnosis: Principal Problem:   DVT (deep venous thrombosis) (HCC) Active Problems:   Chronic kidney disease, stage III (moderate)   Mobitz type II atrioventricular block   CAD (coronary artery disease)   Type 2 diabetes mellitus (HCC)   Discharge Medications: Allergies as of 06/19/2016      Reactions   Metformin And Related Other (See Comments)   Renal insufficiency      Medication List    TAKE these medications   amLODipine 10 MG tablet Commonly known as:  NORVASC Take 10 mg by mouth daily.   atorvastatin 80 MG tablet Commonly known as:  LIPITOR Take 1 tablet (80 mg total) by mouth daily at 6 PM.   BAYER MICROLET LANCETS lancets Use as instructed to check blood sugar once daily dx code 250.42   clopidogrel 75 MG tablet Commonly known as:  PLAVIX Take 1 tablet (75 mg total) by mouth daily.   Dulaglutide 1.5 MG/0.5ML Sopn Inject 0.5 mLs into the skin once a week.   furosemide 40 MG tablet Commonly known as:  LASIX Take 40 mg by mouth every other day.   glimepiride 1 MG tablet Commonly known as:  AMARYL Take 1 mg by mouth daily.   glucose blood test strip Commonly known as:  ONETOUCH VERIO Use as instructed to check blood sugar once a day dx code E11.29   isosorbide mononitrate 60 MG 24 hr tablet Commonly known as:  IMDUR Take 1 tablet (60 mg total) by mouth daily.   losartan 25 MG tablet Commonly known as:  COZAAR Take 1 tablet (25 mg total) by mouth daily. NEED OV.   multivitamin with minerals Tabs tablet Take 1 tablet by mouth daily.   Rivaroxaban 15 MG Tabs tablet Commonly known as:  XARELTO Take 1 tablet (15 mg total) by mouth 2 (two) times daily with a meal.   rivaroxaban 20 MG Tabs tablet Commonly known as:  XARELTO Take 1 tablet (20  mg total) by mouth daily with supper. Begin taking after completing  twice daily Xarelto. Start taking on:  07/31/2016       Disposition and follow-up:   Mr.Ahmar Sandy was discharged from [facility] in Good condition.  At the hospital follow up visit please address:  1.  Provoked RUE DVT: confirm compliance w/ Xarelto. Assess for resolution of swelling.  Follow-up Appointments: Follow-up Information    Lewayne Bunting, MD. Call on 06/21/2016.   Specialty:  Cardiology Why:  Call on Monday to make a follow up appointment. Contact information: [address] [place] [address] [phone]        Devra Dopp, MD. Schedule an appointment as soon as possible for a visit in 2 week(s).   Specialty:  Family Medicine Contact information: [address] [place] [address] [phone]           Hospital Course by problem list: Principal Problem:   DVT (deep venous thrombosis) (HCC) Active Problems:   Chronic kidney disease, stage III (moderate)   Mobitz type II atrioventricular block   CAD (coronary artery disease)   Type 2 diabetes mellitus (HCC)   1. Provoked RUE DVT 2/2 R chest pacemaker: Pt presented after three-week history of right upper extremity swelling which was nonpainful. Patient denied any chest  pain or shortness of breath. He presented initially to his family practice doctor for Doppler US which showed R DVT in subclavian, axillary and basilic veins and referred to the ED for further eval and management. Vascular surgery was consulted given the extent of clot, but recommended only medical management with heparin, then transitioned to Xarelto. Dr. Ladona Ridgel w/ electrophysiology was consulted and noted that DVT was likely 2/2 pacemaker. He should have follow up with his PCP and Cardiology after discharge. Swelling was treated with elevation and ace wrap.  Discharge Vitals:   BP (!) 155/72 (BP Location:  Left Arm)   Pulse 79   Temp 98.4 F (36.9 C) (Oral)   Resp 20   Ht  (1.88 m)   Wt 228 lb 11.2 oz (103.7 kg)   SpO2 96%   BMI 29.36 kg/m   Pertinent Labs, Studies, and Procedures: RUE Doppler US at [facility] 06/17/16: IMPRESSION:Deep venous thrombosis right subclavian and axillary veins. Thrombosis right basilic vein  Consultations: Dr. Randie Heinz, Vascular Surgery Dr. Ladona Ridgel, Electrophysiology  Discharge Instructions: Discharge Instructions    Call MD for:  difficulty breathing, headache or visual disturbances    Complete by:  As directed    Call MD for:  extreme fatigue    Complete by:  As directed    Call MD for:  persistant dizziness or light-headedness    Complete by:  As directed    Call MD for:  severe uncontrolled pain    Complete by:  As directed    Diet - low sodium heart healthy    Complete by:  As directed    Discharge instructions    Complete by:  As directed    He had a blood clot in your arm which is likely related to the pacemaker in your chest. You were started on a blood thinner called Xarelto which you will take  two times a day for 21 days, then  once a day for as long as your pacemaker is in place. You should follow up with Dr. Ladona Ridgel when you leave the hospital and with your Primary Care doctor in the next several weeks.   Increase activity slowly    Complete by:  As directed      Signed: Carolynn Comment, MD 06/19/2016, 12:04 PM   Pager: [phone]

## 2016-06-19 NOTE — Progress Notes (Signed)
ANTICOAGULATION CONSULT NOTE - Follow Up Consult  Pharmacy Consult for heparin Indication: DVT  Labs:  Recent Labs  06/18/16 1034 06/18/16 1923 06/19/16 0234  HGB 10.6*  --  9.7*  HCT 30.6*  --  28.7*  PLT 182  --  177  LABPROT 13.7  --   --   INR 1.05  --   --   HEPARINUNFRC  --  0.36 0.33  CREATININE 1.92*  --   --     Assessment: 66yo male remains therapeutic on heparin though trending down and at low end of goal, would prefer higher level w/ acute DVT.  Goal of Therapy:  Heparin level 0.3-0.7 units/ml   Plan:  Will increase heparin gtt by 2 units/kg/hr to 1800 units/hr and check level in 6hr.  Vernard Gambles, PharmD, BCPS  06/19/2016,3:34 AM

## 2016-06-19 NOTE — Progress Notes (Signed)
Pt has orders to be discharged. Discharge instructions given and pt has no additional questions at this time. Medication regimen reviewed and pt educated. Pt verbalized understanding and has no additional questions. Telemetry box removed. IV removed and site in good condition. Pt stable and waiting for transportation.   Marquerite Forsman RN 

## 2016-06-19 NOTE — Progress Notes (Signed)
Subjective: Currently, the patient is doing very well. No pain. Swelling RUE improved. Saw VVS and Cardiology yesterday who all agree with plan for NOAC therapy. Agreeable to DC today on Xarelto. Cautions for [facility] given. Will f/u with Cardiology and PCP.  Objective: Vital signs in last 24 hours: Vitals:   06/18/16 1406 06/18/16 2058 06/19/16 0038 06/19/16 0453  BP: (!) 167/71 135/68 (!) 142/67 (!) 155/72  Pulse: (!) 31 68 71 79  Resp: Temp: 97.9 F (36.6 C) 99.6 F (37.6 C) 98.1 F (36.7 C) 98.4 F (36.9 C)  TempSrc: Oral Oral Oral Oral  SpO2: 100% 100% 99% 96%  Weight:    228 lb 11.2 oz (103.7 kg)  Height:       Physical Exam: Physical Exam  Constitutional: No distress.  Cardiovascular: Normal rate, regular rhythm and normal heart sounds.   Pacemaker palpated subcutaneously on R upper chest  Pulmonary/Chest: Effort normal and breath sounds normal.  Abdominal: Soft. Bowel sounds are normal. There is no tenderness.  Musculoskeletal: Normal range of motion. He exhibits edema (Pitting edema improved, now only elbow, 2+). He exhibits no tenderness (no TTP).   Labs: CBC:  Recent Labs Lab 06/18/16 1034 06/19/16 0234  WBC 5.3 5.4  HGB 10.6* 9.7*  HCT 30.6* 28.7*  MCV 88.4 88.3  PLT 182 177   Metabolic Panel:  Recent Labs Lab 06/18/16 1034 06/19/16 0234  NA 132* 134*  K 3.9 3.8  CL 98* 102  CO2 22 21*  GLUCOSE 226* 137*  BUN 36* 32*  CREATININE 1.92* 1.73*  CALCIUM 9.4 8.8*  LABPROT 13.7  --   INR 1.05  --   BG:  Recent Labs Lab 06/18/16 1647 06/18/16 2227 06/19/16 0801  GLUCAP 218* 135* 156*   Lab Results  Component Value Date   HGBA1C 10.3 (H) 03/20/2015    Medications: Scheduled Medications: . amLODipine  10 mg Oral Daily  . atorvastatin  80 mg Oral q1800  . clopidogrel  75 mg Oral Daily  . furosemide  40 mg Oral QODAY  . insulin aspart  0-15 Units Subcutaneous TID WC  . insulin aspart  0-5 Units Subcutaneous QHS  . isosorbide  mononitrate  60 mg Oral Daily  . losartan  25 mg Oral Daily  . rivaroxaban  15 mg Oral BID WC   Followed by  . [START ON 07/10/2016] rivaroxaban  20 mg Oral Q supper  . sodium chloride flush  3 mL Intravenous Q12H   PRN Medications: acetaminophen **OR** acetaminophen, promethazine, senna-docusate  Assessment/Plan: [name] is a 66 y.o. male with CAD, ICD, HTN, HLD, DM, CKD, PAF, and bradycardia with h/o mobitz II AV block with pacemaker on plavix who presents with swelling of RUE found to have DVT.  1) RUE DVT: likely provoked by R chest pacemaker. Vascular agrees with NOAC for duration of pacemaker. Will have f/u with Cardiology. Transitioned from heparin to Xarelto. - Xarelto  BID q21d, then  indefinitely - f/u w/ Cardiology. - Elevate arm, can use ace bandage wrap for swelling.  2) AV Block s/p BiV pacemaker: placed in 01/2016 by Dr. Mayford Knife in R upper chest. Interrogated w/ good function. On Plavix.  3) HTN: Normotensive. Continue home meds.  4) DM: Last A1c in our system 03/2015 10.5. Random BG 226. Home meds: glimepiride  qD, dulaglutide 0.5mg  qWk. Hold home meds. - SSI-m qAC/HS  Length of Stay: 1 day(s) Dispo: Anticipated discharge today.  Carolynn Comment, MD Pager:  [phone] (7AM-5PM) 06/19/2016, 11:37 AM

## 2016-06-19 NOTE — Progress Notes (Signed)
  Date: 06/19/2016  Patient name: [name] [name]  Medical record number: [id number]  Date of birth: 01-11-1951   I have seen and evaluated [name] [name] and discussed their care with the Residency Team. [name] [name] is a 66 year old man chief complaint right upper extremity edema. In December 2017, he had a pacemaker inserted in his right anterior chest for symptomatic 2;1 AV block.  About 3 weeks prior to admission, he started having swelling of the right upper extremity. His PCP ordered a right upper extremity which was done at an outside facility and showed a DVT of the subclavian, axillary, and basilic vein.  He was started on the heparin drip in the ED and has since been converted to rivaroxiban.   This morning, he feels great and thinks that the swelling on his right arm has gone down. He has no other complaints. This is his first VTE and he has no family history of VTE.  PMHx, Fam Hx, and/or Soc Hx :  Past medical history includes coronary artery disease, hypertension, hyperlipidemia, diabetes,  And chronic kidney disease. His father has heart disease. Social history :  He is married and is a former smoker.  Vitals:   06/19/16 0038 06/19/16 0453  BP: (!) 142/67 (!) 155/72  Pulse: 71 79  Resp: 20 20  Temp: 98.1 F (36.7 C) 98.4 F (36.9 C)   HRRR no MRG LCTAB RUE edema   HgB 10.6 - 9.7 Cr 1.9 - 1.73  I independently reviewed his EKG :  Paced rhythm, left axis deviation, repolarization abnormalities  Assessment and Plan: I have seen and evaluated the patient as outlined above. I agree with the formulated Assessment and Plan as detailed in the residents' note, with the following changes:   1.  Right upper extremity DVT -  The only inciting factor is the placement of his pacer which is a known but not common risk factor for upper extremity DVT. Due to his age and the known risk factor, there is no need to get a hyper coag panel especially since he has an acute clot.  He is going to require  anticoagulation which will be done with Rivaroxiban.  He has been educated extensively by multiple providers about this medication. And then reassessment of the risk and benefits of ongoing anticoagulation especially since the pacemaker cannot be removed.  Other medical issues per Dr. Alm Bustard progress note. Stable for DC'd to home today  Burns Spain, MD 4/21/20181:40 PM

## 2016-06-19 NOTE — Progress Notes (Signed)
ANTICOAGULATION CONSULT NOTE - Initial Consult  Pharmacy Consult for Xarelto Indication: DVT  Allergies  Allergen Reactions  . Metformin And Related Other (See Comments)    Renal insufficiency    Patient Measurements: Height:  (188 cm) Weight: 228 lb 11.2 oz (103.7 kg) IBW/kg (Calculated) : 82.2  Vital Signs: Temp: 98.4 F (36.9 C) (04/21 0453) Temp Source: Oral (04/21 0453) BP: 155/72 (04/21 0453) Pulse Rate: 79 (04/21 0453)  Labs:  Recent Labs  06/18/16 1034 06/18/16 1923 06/19/16 0234  HGB 10.6*  --  9.7*  HCT 30.6*  --  28.7*  PLT 182  --  177  LABPROT 13.7  --   --   INR 1.05  --   --   HEPARINUNFRC  --  0.36 0.33  CREATININE 1.92*  --  1.73*    Estimated Creatinine Clearance: 53.9 mL/min (A) (by C-G formula based on SCr of 1.73 mg/dL (H)).   Medical History: Past Medical History:  Diagnosis Date  . Arthritis   . CKD (chronic kidney disease) stage 3, GFR 30-59 ml/min    CKD  . Coronary artery disease    s/p NSTEMI with multivessel CAD wit hoccluded RCA; myoview with inferior and inferoseptal infarct with minimal peri infarct ishemia - managed medically  . Diabetes mellitus   . Diabetes mellitus type 2 in obese (HCC)   . Dysrhythmia 03/2012   bradycardia  . Heart murmur   . History of echocardiogram    Echo 8/16:  Mild LVH, EF 50-55%, trivial AI, mild MR, severe LAE, mild RAE, PASP 37 mmHg  //  Echo 03/02/16: Mild concentric LVH, EF 50-55, normal wall motion, grade 2 diastolic dysfunction, mild MR, moderate to severe LAE, PASP 35  . Hypertension   . LV dysfunction Jan 2014   EF is 45 to 50% per echo  . Myocardial infarction (HCC) 03/2012  . Neuromuscular disorder (HCC)    TREMORS  . Obese   . PAF (paroxysmal atrial fibrillation) (HCC) Jan 2014   Brief episode - no anticoagulation    Medications:  Prescriptions Prior to Admission  Medication Sig Dispense Refill Last Dose  . amLODipine (NORVASC) 10 MG tablet Take 10 mg by mouth daily.     06/17/2016 at Unknown time  . atorvastatin (LIPITOR) 80 MG tablet Take 1 tablet (80 mg total) by mouth daily at 6 PM. 30 tablet 6 06/17/2016 at Unknown time  . BAYER MICROLET LANCETS lancets Use as instructed to check blood sugar once daily dx code 250.42 100 each 1 Past Week at Unknown time  . clopidogrel (PLAVIX) 75 MG tablet Take 1 tablet (75 mg total) by mouth daily. 30 tablet 6 06/17/2016 at Unknown time  . Dulaglutide 1.5 MG/0.5ML SOPN Inject 0.5 mLs into the skin once a week.   Past Week at Unknown time  . furosemide (LASIX) 40 MG tablet Take 40 mg by mouth every other day.   06/17/2016 at Unknown time  . glimepiride (AMARYL) 1 MG tablet Take 1 mg by mouth daily.   06/17/2016 at Unknown time  . glucose blood (ONETOUCH VERIO) test strip Use as instructed to check blood sugar once a day dx code E11.29 100 each 1 Past Week at Unknown time  . isosorbide mononitrate (IMDUR) 60 MG 24 hr tablet Take 1 tablet (60 mg total) by mouth daily. 30 tablet 6 06/17/2016 at Unknown time  . losartan (COZAAR) 25 MG tablet Take 1 tablet (25 mg total) by mouth daily. NEED OV. 90 tablet  0 06/17/2016 at Unknown time  . Multiple Vitamin (MULTIVITAMIN WITH MINERALS) TABS Take 1 tablet by mouth daily.   06/17/2016 at Unknown time   Scheduled:  . amLODipine  10 mg Oral Daily  . atorvastatin  80 mg Oral q1800  . clopidogrel  75 mg Oral Daily  . furosemide  40 mg Oral QODAY  . insulin aspart  0-15 Units Subcutaneous TID WC  . insulin aspart  0-5 Units Subcutaneous QHS  . isosorbide mononitrate  60 mg Oral Daily  . losartan  25 mg Oral Daily  . sodium chloride flush  3 mL Intravenous Q12H   Infusions:  . heparin 1,800 Units/hr (06/19/16 0503)    Assessment: 66yo male to transition from heparin to Xarelto for DVT.   Plan:  Will begin Xarelto  BID x21 days then  daily; stop heparin at first dose of Xarelto; begin Xarelto education.  Vernard Gambles, PharmD, BCPS  06/19/2016,7:01 AM

## 2016-08-03 ENCOUNTER — Other Ambulatory Visit: Payer: Self-pay | Admitting: Internal Medicine

## 2016-08-05 ENCOUNTER — Other Ambulatory Visit: Payer: Self-pay | Admitting: Internal Medicine

## 2016-08-06 ENCOUNTER — Telehealth: Payer: Self-pay | Admitting: Cardiology

## 2016-08-06 ENCOUNTER — Telehealth: Payer: Self-pay | Admitting: Internal Medicine

## 2016-08-06 MED ORDER — RIVAROXABAN 20 MG PO TABS: 20.0000 mg | ORAL_TABLET | Freq: Every day | ORAL | 0 refills | Status: DC

## 2016-08-06 NOTE — Telephone Encounter (Signed)
Made return call to patient-he would like to know if he should continue  xarelto 20mg  daily.  Per pt's discharge summary, he is to keep taking xarelto as long as he has the pacemaker.  Pt has been seen by his pcp since hospitalization, but has failed to make an appt with cardiology. [facility], but was seen our physicians during his hospital stay.  Pt will also need refill on the xarelto 20mg  tabs.  Will forward request to his pcp.Kingsley SpittleGoldston, Darlene Cassady6/8/20182:54 PM  .

## 2016-08-06 NOTE — Telephone Encounter (Signed)
Pt recently in Hospital and has questions about his medications.  Please call back.

## 2016-08-06 NOTE — Telephone Encounter (Signed)
[facility] states that pt is calling them because he needs a refill of Xarelto pt only has 2-3 tablets left. Pt has appt 6-20 @1040am  Dr SwazilandJordan.  Pt notified of appt and states that he has not taken Plavix he is asking if he needed to stop Xarelto and switch back to Plavix? Or keep taking Xarelto and re-start plavix? Please advise

## 2016-08-06 NOTE — Telephone Encounter (Signed)
[facility] to make [facility] aware of refill request, when I was [facility].  Pt's EMR and d/c summary-plavix and xarelto are listed as active medications.  Therefore they wanted pt to follow up with his cardiologist.Goldston, Darlene Cassady6/8/20183:13 PM    I contacted pt's cardiology office-spoke with a nurse and was informed that pt already has an appt scheduled for 08/18/16 @10 :40am. Office was made aware of refill request.  They will contact pt for additional info and then send the request to the appropriate person there in their office.Kingsley SpittleGoldston, Darlene Cassady6/8/20183:19 PM

## 2016-08-06 NOTE — Telephone Encounter (Signed)
Per discharge orders on 06/19/16 patient to be on Xarelto and plavix.  Rx for xarelto 20mg  daily; #30., NR sent to prefer pharmacy  Patient informed of above

## 2016-08-10 ENCOUNTER — Telehealth: Payer: Self-pay | Admitting: Cardiology

## 2016-08-10 MED ORDER — CLOPIDOGREL BISULFATE 75 MG PO TABS: 75.0000 mg | ORAL_TABLET | Freq: Every day | ORAL | 6 refills | Status: DC

## 2016-08-10 NOTE — Telephone Encounter (Signed)
New message    Pt is calling with questions about his blood thinner. Please call.

## 2016-08-10 NOTE — Telephone Encounter (Signed)
Returned call to patient he stated he wanted to know if he needed to continue Plavix and Xarelto.Message sent to pharmacy for advice.

## 2016-08-11 NOTE — Telephone Encounter (Signed)
Left message for patient to continue medications until follow up appt.

## 2016-08-11 NOTE — Telephone Encounter (Signed)
Patient has appointment with Dr. SwazilandJordan on June 20.  He needs to take BOTH medications until he sees Dr. SwazilandJordan, who can clarify for him what he should be taking.

## 2016-08-15 NOTE — Progress Notes (Deleted)
Cardiology Office Note    Date:  08/15/2016   ID:  [name] [name], DOB 1950/12/14, MRN [id number]  PCP:  Devra Dopp, MD  Cardiologist:  Dr. Swaziland EP: Dr. Ladona Ridgel  No chief complaint on file.   History of Present Illness:  [name] [name] is a 66 y.o. male with PMH of CAD, ICD, HTN, HLD, DM, CKD, PAF, and bradycardia with h/o mobitz II AV block. He is not on ACE inhibitor due to renal disease. He has suffered a NSTEMI in January 2014 demonstrating multivessel CAD with occluded RCA with collaterals and a high-grade stenosis at the ostium of the first diagonal branch. There was also moderate 70% ostial left circumflex lesion. Medical therapy was recommended along with subsequent Myoview for assessment of ischemic burden. Nuclear study demonstrating inferior and inferoseptal scar with minimal peri-infarct ischemia. Ejection fraction on echocardiogram was 45-50%. He had a repeat echocardiogram in August 2016 that showed EF 50-55%, severe left atrial enlargement, PASP 37 mmHg. He was  seen on 01/20/2016, at which time he appeared to have massive lower extremity edema and elevated JVP consistent with right-sided heart failure. His Lasix was increased to 40 mg twice a day.  He was seen by Dr. Ladona Ridgel in the office on 02/04/2016, due to symptomatic 2:1 AV block, pacemaker was recommended with attempt of His bundle pacing. He eventually underwent successful Med[employer]onic biventricular CRT-P insertion on 02/12/2016. Repeat Echo in January 2018 showed normal LV systolic function, diastolic dysfunction, mild MR and mild pulmonary HTN. Repeat pacemaker evaluation in March 2018 was normal. He was admitted about 3 weeks post pacemaker implant with pain and swelling of his arm and developed a subclavian and axillary vein DVT. He was treated with Xarelto.   On follow up today he is doing markedly better. He has lost 43 lbs. Edema has resolved. He denies any dyspnea, chest pain, or dizziness. He is restricting his salt  intake. 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 Reports sugars are doing well on Trulicity.   Past Medical History:  Diagnosis Date  . Arthritis   . CKD (chronic kidney disease) stage 3, GFR 30-59 ml/min    CKD  . Coronary artery disease    s/p NSTEMI with multivessel CAD wit hoccluded RCA; myoview with inferior and inferoseptal infarct with minimal peri infarct ishemia - managed medically  . Diabetes mellitus   . Diabetes mellitus type 2 in obese (HCC)   . Dysrhythmia 03/2012   bradycardia  . Heart murmur   . History of echocardiogram    Echo 8/16:  Mild LVH, EF 50-55%, trivial AI, mild MR, severe LAE, mild RAE, PASP 37 mmHg  //  Echo 03/02/16: Mild concentric LVH, EF 50-55, normal wall motion, grade 2 diastolic dysfunction, mild MR, moderate to severe LAE, PASP 35  . Hypertension   . LV dysfunction Jan 2014   EF is 45 to 50% per echo  . Myocardial infarction (HCC) 03/2012  . Neuromuscular disorder (HCC)    TREMORS  . Obese   . PAF (paroxysmal atrial fibrillation) [facility]) Jan 2014   Brief episode - no anticoagulation    Past Surgical History:  Procedure Laterality Date  . CARDIAC CATHETERIZATION  03/21/2012  . EP IMPLANTABLE DEVICE N/A 02/12/2016   Procedure: BiV Pacemaker Insertion CRT-P;  Surgeon: Marinus Maw, MD;  Location: [facility] INVASIVE CV LAB;  Service: Cardiovascular;  Laterality: N/A;  . LEFT HEART CATHETERIZATION WITH CORONARY ANGIOGRAM N/A 03/21/2012   Procedure: LEFT HEART  CATHETERIZATION WITH CORONARY ANGIOGRAM;  Surgeon: Peter M Swaziland, MD;  Location: [facility] CATH LAB;  Service: Cardiovascular;  Laterality: N/A;  . RIB FRACTURE SURGERY    . s/p knee surgery for torn ligament      Current Medications: Outpatient Medications Prior to Visit  Medication Sig Dispense Refill  . amLODipine (NORVASC) 10 MG tablet Take 10 mg by mouth daily.     Marland Kitchen atorvastatin (LIPITOR) 80 MG tablet Take 1 tablet (80 mg total) by mouth daily at 6 PM. 30 tablet 6  . BAYER  MICROLET LANCETS lancets Use as instructed to check blood sugar once daily dx code 250.42 100 each 1  . clopidogrel (PLAVIX) 75 MG tablet Take 1 tablet (75 mg total) by mouth daily. 30 tablet 6  . Dulaglutide 1.5 MG/0.5ML SOPN Inject 0.5 mLs into the skin once a week.    . furosemide (LASIX) 40 MG tablet Take 40 mg by mouth every other day.    Marland Kitchen glimepiride (AMARYL) 1 MG tablet Take 1 mg by mouth daily.    Marland Kitchen glucose blood (ONETOUCH VERIO) test strip Use as instructed to check blood sugar once a day dx code E11.29 100 each 1  . isosorbide mononitrate (IMDUR) 60 MG 24 hr tablet Take 1 tablet (60 mg total) by mouth daily. 30 tablet 6  . losartan (COZAAR) 25 MG tablet Take 1 tablet (25 mg total) by mouth daily. NEED OV. 90 tablet 0  . Multiple Vitamin (MULTIVITAMIN WITH MINERALS) TABS Take 1 tablet by mouth daily.    . rivaroxaban (XARELTO) 20 MG TABS tablet Take 1 tablet (20 mg total) by mouth daily with supper. 30 tablet 0   No facility-administered medications prior to visit.      Allergies:   Metformin and related   Social History   Social History  . Marital status: Married    Spouse name: N/A  . Number of children: N/A  . Years of education: N/A   Social History Main Topics  . Smoking status: Former Smoker    Packs/day: 0.25    Years: 6.00    Types: Cigarettes    Start date: 06/30/1966    Quit date: 06/29/1972  . Smokeless tobacco: Never Used  . Alcohol use No  . Drug use: No  . Sexual activity: No   Other Topics Concern  . Not on file   Social History Narrative  . No narrative on file     Family History:  The patient's family history includes Heart disease in his father.   ROS:   Please see the history of present illness.    ROS All other systems reviewed and are negative.   PHYSICAL EXAM:   VS:  There were no vitals taken for this visit.   GEN: Well nourished, well developed, in no acute distress  HEENT: normal  Neck: no JVD, carotid bruits, or masses Cardiac:  RRR; no murmurs, rubs, or gallops,no edema  Respiratory:  clear to auscultation bilaterally, normal work of breathing GI: soft, nontender, nondistended, + BS MS: no deformity or atrophy  Skin: warm and dry, no rash Neuro:  Alert and Oriented x 3, Strength and sensation are intact Psych: euthymic mood, full affect  Wt Readings from Last 3 Encounters:  06/19/16 228 lb 11.2 oz (103.7 kg)  05/19/16 236 lb 12.8 oz (107.4 kg)  04/20/16 240 lb 12.8 oz (109.2 kg)      Studies/Labs Reviewed:   EKG:  EKG is not ordered today.   Recent  Labs: 01/20/2016: Brain Natriuretic Peptide 723.3 02/05/2016: TSH 3.39 06/19/2016: BUN 32; Creatinine, Ser 1.73; Hemoglobin 9.7; Platelets 177; Potassium 3.8; Sodium 134   Lipid Panel    Component Value Date/Time   CHOL 95 03/20/2015 0846   TRIG 55.0 03/20/2015 0846   HDL 34.30 (L) 03/20/2015 0846   CHOLHDL 3 03/20/2015 0846   VLDL 11.0 03/20/2015 0846   LDLCALC 49 03/20/2015 0846    Additional studies/ records that were reviewed today include:   Lipid panel dated 11/06/15: cholesterol 77, triglycerides 57, HDL 30, LDL 36.  Echo 03/02/16:Study Conclusions  - Left ventricle: The cavity size was normal. There was mild   concentric hypertrophy. Systolic function was normal. The   estimated ejection fraction was in the range of 50% to 55%. Wall   motion was normal; there were no regional wall motion   abnormalities. Features are consistent with a pseudonormal left   ventricular filling pattern, with concomitant abnormal relaxation   and increased filling pressure (grade 2 diastolic dysfunction). - Mitral valve: There was mild regurgitation directed centrally. - Left atrium: The atrium was moderately to severely dilated. - Pulmonary arteries: Systolic pressure was mildly increased. PA   peak pressure: 35 mm Hg (S).  Myoview 03/28/12 IMPRESSION: 1. Mild LV systolic dysfunction, EF 42% with inferior and inferoseptal hypokinesis. 2. Fixed medium-sized,  severe basal to mid inferior and inferoseptal perfusion defect. This suggests prior infarction with minimal ischemia. 3. Intermediate risk study.   LHC 03/21/12 LM: 30% distal left main. LAD: Mild wall irregularities. There is a large diagonal with a 90-95% ostial lesion. LCx: The left circumflex gives rise to a single large marginal branch. There is a 70-80% ostial LCX lesion. RCA: The RCA is occluded proximally with evidence of recent thrombus. There are left to right collaterals to the distal RCA. Left ventriculography: Not performed. Final Conclusions:  1. 3 vessel obstructive CAD. Recent RCA occlusion. Recommendations: Would maximize medical therapy. Consider stress myoview on medical therapy to assess ischemic burden and symptoms. Ostial location of diagonal and LCX disease is not optimal for PCI. If significant symptoms or ischemia on medical therapy may need to consider CABG.   ASSESSMENT:    No diagnosis found.   PLAN:  In order of problems listed above:  1. Chronic diastolic heart failure: Recent Echo showed normal LV systolic function. He has lost roughly 43 pounds since his pacemaker placement. His overall volume status is good.   I have also instructed him on sodium and fluid restriction. 2. 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 3. Coronary artery disease: Previous cardiac catheterization in 2014, three-vessel disease with high degree stenosis in some branch vessels, currently on medical therapy. Asymptomatic.  4. CKD stage III 5. DM II: on Trulicity. 6. Hyperlipidemia continue atorvastatin 80 mg daily.    Medication Adjustments/Labs and Tests Ordered: Current medicines are reviewed at length with the patient today.  Concerns regarding medicines are outlined above.  Medication changes, Labs and Tests ordered today are listed in the Patient Instructions below. There are no Patient Instructions on file for this visit.   Signed, Peter SwazilandJordan,  MD  08/15/2016 8:19 PM    [facility]

## 2016-08-18 ENCOUNTER — Ambulatory Visit (INDEPENDENT_AMBULATORY_CARE_PROVIDER_SITE_OTHER): Payer: Medicare HMO | Admitting: *Deleted

## 2016-08-18 ENCOUNTER — Ambulatory Visit: Payer: Medicare HMO | Admitting: Cardiology

## 2016-08-18 DIAGNOSIS — I5042 Chronic combined systolic (congestive) and diastolic (congestive) heart failure: Secondary | ICD-10-CM

## 2016-08-18 DIAGNOSIS — I441 Atrioventricular block, second degree: Secondary | ICD-10-CM | POA: Diagnosis not present

## 2016-08-19 NOTE — Progress Notes (Signed)
Remote pacemaker transmission.   

## 2016-08-20 ENCOUNTER — Encounter: Payer: Self-pay | Admitting: Cardiology

## 2016-08-25 LAB — CUP PACEART REMOTE DEVICE CHECK
Battery Remaining Longevity: 115 mo
Battery Voltage: 3.07 V
Brady Statistic RA Percent Paced: 13.04 %
Date Time Interrogation Session: 20180620124939
Implantable Lead Implant Date: 20171214
Implantable Lead Implant Date: 20171214
Implantable Lead Implant Date: 20171214
Implantable Lead Location: 753858
Implantable Lead Model: 5076
Implantable Pulse Generator Implant Date: 20171214
Lead Channel Impedance Value: 285 Ohm
Lead Channel Impedance Value: 361 Ohm
Lead Channel Impedance Value: 380 Ohm
Lead Channel Impedance Value: 570 Ohm
Lead Channel Impedance Value: 589 Ohm
Lead Channel Pacing Threshold Amplitude: 0.5 V
Lead Channel Pacing Threshold Amplitude: 0.625 V
Lead Channel Pacing Threshold Pulse Width: 0.4 ms
Lead Channel Setting Pacing Amplitude: 1.5 V
Lead Channel Setting Pacing Amplitude: 2 V
Lead Channel Setting Pacing Amplitude: 2.5 V
Lead Channel Setting Pacing Pulse Width: 0.4 ms
MDC IDC LEAD LOCATION: 753859
MDC IDC LEAD LOCATION: 753860
MDC IDC MSMT LEADCHNL LV IMPEDANCE VALUE: 342 Ohm
MDC IDC MSMT LEADCHNL LV IMPEDANCE VALUE: 380 Ohm
MDC IDC MSMT LEADCHNL LV IMPEDANCE VALUE: 399 Ohm
MDC IDC MSMT LEADCHNL LV IMPEDANCE VALUE: 551 Ohm
MDC IDC MSMT LEADCHNL LV IMPEDANCE VALUE: 551 Ohm
MDC IDC MSMT LEADCHNL LV IMPEDANCE VALUE: 570 Ohm
MDC IDC MSMT LEADCHNL LV IMPEDANCE VALUE: 627 Ohm
MDC IDC MSMT LEADCHNL LV PACING THRESHOLD AMPLITUDE: 1 V
MDC IDC MSMT LEADCHNL LV PACING THRESHOLD PULSEWIDTH: 0.4 ms
MDC IDC MSMT LEADCHNL RA IMPEDANCE VALUE: 342 Ohm
MDC IDC MSMT LEADCHNL RA SENSING INTR AMPL: 3.375 mV
MDC IDC MSMT LEADCHNL RA SENSING INTR AMPL: 3.375 mV
MDC IDC MSMT LEADCHNL RV IMPEDANCE VALUE: 456 Ohm
MDC IDC MSMT LEADCHNL RV PACING THRESHOLD PULSEWIDTH: 0.4 ms
MDC IDC MSMT LEADCHNL RV SENSING INTR AMPL: 13.5 mV
MDC IDC MSMT LEADCHNL RV SENSING INTR AMPL: 13.5 mV
MDC IDC SET LEADCHNL RV PACING PULSEWIDTH: 0.4 ms
MDC IDC SET LEADCHNL RV SENSING SENSITIVITY: 2.8 mV
MDC IDC STAT BRADY AP VP PERCENT: 13.07 %
MDC IDC STAT BRADY AP VS PERCENT: 0.01 %
MDC IDC STAT BRADY AS VP PERCENT: 86.58 %
MDC IDC STAT BRADY AS VS PERCENT: 0.34 %
MDC IDC STAT BRADY RV PERCENT PACED: 99.6 %

## 2016-09-16 NOTE — Progress Notes (Signed)
Cardiology Office Note    Date:  09/23/2016   ID:  [name] HomesCarl [name], DOB 12/07/1950, MRN [id number]  PCP:  Devra DoppHowell, Tamieka, MD  Cardiologist:  Dr. SwazilandJordan EP: Dr. Ladona Ridgelaylor  Chief Complaint  Patient presents with  . Follow-up    4 months;  . Coronary Artery Disease  . Atrial Fibrillation    History of Present Illness:  [name] HomesCarl [name] is a 66 y.o. male with PMH of CAD, ICD, HTN, HLD, DM, CKD, PAF, and bradycardia with h/o mobitz II AV block. He is not on ACE inhibitor due to renal disease. He has suffered a NSTEMI in January 2014 demonstrating multivessel CAD with occluded RCA with collaterals and a high-grade stenosis at the ostium of the first diagonal branch. There was also moderate 70% ostial left circumflex lesion. Medical therapy was recommended along with subsequent Myoview for assessment of ischemic burden. Nuclear study demonstrating inferior and inferoseptal scar with minimal peri-infarct ischemia. Ejection fraction on echocardiogram was 45-50%. He had a repeat echocardiogram in August 2016 that showed EF 50-55%, severe left atrial enlargement, PASP 37 mmHg. He was  seen on 01/20/2016, at which time he appeared to have massive lower extremity edema and elevated JVP consistent with right-sided heart failure. His Lasix was increased to 40 mg twice a day.  He was seen by Dr. Ladona Ridgelaylor in the office on 02/04/2016, due to symptomatic 2:1 AV block, pacemaker was recommended with attempt of His bundle pacing. He eventually underwent successful Medtronic biventricular CRT-P insertion on 02/12/2016. Repeat Echo in January 2018 showed normal LV systolic function, diastolic dysfunction, mild MR and mild pulmonary HTN. He was admitted in April 2018 with RUE DVT felt to be related to pacemaker. He was placed on Xarelto. Last pacemaker check in June 2018 showed Afib 0.2%. And one episode of NSVT. Optivol reading improving.   On follow up today he is doing very well. He denies any swelling, chest pain, dyspnea, or  palpitations. Remains on Xarelto for DVT. Swimming daily. No complaints.   Past Medical History:  Diagnosis Date  . Arthritis   . CKD (chronic kidney disease) stage 3, GFR 30-59 ml/min    CKD  . Coronary artery disease    s/p NSTEMI with multivessel CAD wit hoccluded RCA; myoview with inferior and inferoseptal infarct with minimal peri infarct ishemia - managed medically  . Diabetes mellitus   . Diabetes mellitus type 2 in obese (HCC)   . Dysrhythmia 03/2012   bradycardia  . Heart murmur   . History of echocardiogram    Echo 8/16:  Mild LVH, EF 50-55%, trivial AI, mild MR, severe LAE, mild RAE, PASP 37 mmHg  //  Echo 03/02/16: Mild concentric LVH, EF 50-55, normal wall motion, grade 2 diastolic dysfunction, mild MR, moderate to severe LAE, PASP 35  . Hypertension   . LV dysfunction Jan 2014   EF is 45 to 50% per echo  . Myocardial infarction (HCC) 03/2012  . Neuromuscular disorder (HCC)    TREMORS  . Obese   . PAF (paroxysmal atrial fibrillation) [facility](HCC) Jan 2014   Brief episode - no anticoagulation    Past Surgical History:  Procedure Laterality Date  . CARDIAC CATHETERIZATION  03/21/2012  . EP IMPLANTABLE DEVICE N/A 02/12/2016   Procedure: BiV Pacemaker Insertion CRT-P;  Surgeon: Marinus MawGregg W Taylor, MD;  [facility];  Service: Cardiovascular;  Laterality: N/A;  . LEFT HEART CATHETERIZATION WITH CORONARY ANGIOGRAM N/A 03/21/2012   Procedure: LEFT HEART CATHETERIZATION WITH CORONARY ANGIOGRAM;  Surgeon: Peter M Swaziland, MD;  Location: [facility] CATH LAB;  Service: Cardiovascular;  Laterality: N/A;  . RIB FRACTURE SURGERY    . s/p knee surgery for torn ligament      Current Medications: Outpatient Medications Prior to Visit  Medication Sig Dispense Refill  . amLODipine (NORVASC) 10 MG tablet Take 10 mg by mouth daily.     Marland Kitchen atorvastatin (LIPITOR) 80 MG tablet Take 1 tablet (80 mg total) by mouth daily at 6 PM. 30 tablet 6  . BAYER MICROLET LANCETS lancets Use as instructed to  check blood sugar once daily dx code 250.42 100 each 1  . Dulaglutide 1.5 MG/0.5ML SOPN Inject 0.5 mLs into the skin once a week.    . furosemide (LASIX) 40 MG tablet Take 40 mg by mouth every other day.    Marland Kitchen glucose blood (ONETOUCH VERIO) test strip Use as instructed to check blood sugar once a day dx code E11.29 100 each 1  . isosorbide mononitrate (IMDUR) 60 MG 24 hr tablet Take 1 tablet (60 mg total) by mouth daily. 30 tablet 6  . losartan (COZAAR) 25 MG tablet Take 1 tablet (25 mg total) by mouth daily. NEED OV. 90 tablet 0  . Multiple Vitamin (MULTIVITAMIN WITH MINERALS) TABS Take 1 tablet by mouth daily.    [name] 20 MG TABS tablet TAKE 1 TABLET BY MOUTH WITH SUPPER 90 tablet 1  . clopidogrel (PLAVIX) 75 MG tablet Take 1 tablet (75 mg total) by mouth daily. 30 tablet 6  . glimepiride (AMARYL) 1 MG tablet Take 1 mg by mouth daily.     No facility-administered medications prior to visit.      Allergies:   Metformin and related   Social History   Social History  . Marital status: Married    Spouse name: N/A  . Number of children: N/A  . Years of education: N/A   Social History Main Topics  . Smoking status: Former Smoker    Packs/day: 0.25    Years: 6.00    Types: Cigarettes    Start date: 06/30/1966    Quit date: 06/29/1972  . Smokeless tobacco: Never Used  . Alcohol use No  . Drug use: No  . Sexual activity: No   Other Topics Concern  . None   Social History Narrative  . None     Family History:  The patient's family history includes Heart disease in his father.   ROS:   Please see the history of present illness.    ROS All other systems reviewed and are negative.   PHYSICAL EXAM:   VS:  BP 117/70   Pulse 83   Ht 6\' 1"  (1.854 m)   Wt 236 lb 9.6 oz (107.3 kg)   BMI 31.22 kg/m    GEN: Well nourished, well developed, in no acute distress  HEENT: normal  Neck: no JVD, carotid bruits, or masses Cardiac: RRR; no murmurs, rubs, or gallops,no edema    Respiratory:  clear to auscultation bilaterally, normal work of breathing GI: soft, nontender, nondistended, + BS MS: no deformity or atrophy  Skin: warm and dry, no rash Neuro:  Alert and Oriented x 3, Strength and sensation are intact Psych: euthymic mood, full affect  Wt Readings from Last 3 Encounters:  09/23/16 236 lb 9.6 oz (107.3 kg)  06/19/16 228 lb 11.2 oz (103.7 kg)  05/19/16 236 lb 12.8 oz (107.4 kg)      Studies/Labs Reviewed:   EKG:  EKG is  not ordered today.   Recent Labs: 01/20/2016: Brain Natriuretic Peptide 723.3 02/05/2016: TSH 3.39 06/19/2016: BUN 32; Creatinine, Ser 1.73; Hemoglobin 9.7; Platelets 177; Potassium 3.8; Sodium 134   Lipid Panel    Component Value Date/Time   CHOL 95 03/20/2015 0846   TRIG 55.0 03/20/2015 0846   HDL 34.30 (L) 03/20/2015 0846   CHOLHDL 3 03/20/2015 0846   VLDL 11.0 03/20/2015 0846   LDLCALC 49 03/20/2015 0846   Labs dated 11/14/15: cholesterol 77, triglycerides 57, HDL 30, LDL 36.  Dated 5/24/1: BUN 48, creatinine 2.14. Glucose 171, A1c 9.5%.  Additional studies/ records that were reviewed today include:   Lipid panel dated 11/06/15: cholesterol 77, triglycerides 57, HDL 30, LDL 36.  Echo 03/02/16:Study Conclusions  - Left ventricle: The cavity size was normal. There was mild   concentric hypertrophy. Systolic function was normal. The   estimated ejection fraction was in the range of 50% to 55%. Wall   motion was normal; there were no regional wall motion   abnormalities. Features are consistent with a pseudonormal left   ventricular filling pattern, with concomitant abnormal relaxation   and increased filling pressure (grade 2 diastolic dysfunction). - Mitral valve: There was mild regurgitation directed centrally. - Left atrium: The atrium was moderately to severely dilated. - Pulmonary arteries: Systolic pressure was mildly increased. PA   peak pressure: 35 mm Hg (S).   ASSESSMENT:    1. Coronary artery disease  involving native coronary artery of native heart without angina pectoris   2. Chronic diastolic CHF (congestive heart failure) (HCC)   3. Essential hypertension   4. CKD (chronic kidney disease), stage III   5. Cardiac pacemaker   6. Mobitz type 2 second degree atrioventricular block      PLAN:  In order of problems listed above:  1. Chronic diastolic heart failure: Last Echo showed normal LV systolic function.  His overall volume status is good. Weight is stable and he has no edema.   I have also instructed him on sodium and fluid restriction. 2. 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 3. Coronary artery disease: Previous cardiac catheterization in 2014, three-vessel disease with high degree stenosis in some branch vessels, currently on medical therapy. Asymptomatic. Given the fact that he is now on Xarelto I have recommended he stop taking Plavix to minimize bleeding risk.  4. CKD stage III 5. DM II: on Trulicity. Per primary care. 6. Hyperlipidemia continue atorvastatin 80 mg daily. Excellent control. 7. RUE DVT- likely related to pacer leads. On Xarelto. 8. Paroxysmal Afib- infrequent- noted on pacemaker follow up. Also on Xarelto. Given need for long term anticoagulation will stop Plavix.    Medication Adjustments/Labs and Tests Ordered: Current medicines are reviewed at length with the patient today.  Concerns regarding medicines are outlined above.  Medication changes, Labs and Tests ordered today are listed in the Patient Instructions below. Patient Instructions  Stop taking Plavix   Continue your other therapy  I will see you in 6 months.    Signed, Peter Swaziland, MD  09/23/2016 9:41 AM    [facility]

## 2016-09-21 ENCOUNTER — Other Ambulatory Visit: Payer: Self-pay | Admitting: Cardiology

## 2016-09-23 ENCOUNTER — Encounter: Payer: Self-pay | Admitting: Cardiology

## 2016-09-23 ENCOUNTER — Ambulatory Visit (INDEPENDENT_AMBULATORY_CARE_PROVIDER_SITE_OTHER): Payer: Medicare HMO | Admitting: Cardiology

## 2016-09-23 VITALS — BP 117/70 | HR 83 | Ht 73.0 in | Wt 236.6 lb

## 2016-09-23 DIAGNOSIS — Z950 Presence of cardiac pacemaker: Secondary | ICD-10-CM

## 2016-09-23 DIAGNOSIS — N183 Chronic kidney disease, stage 3 unspecified: Secondary | ICD-10-CM

## 2016-09-23 DIAGNOSIS — I10 Essential (primary) hypertension: Secondary | ICD-10-CM | POA: Diagnosis not present

## 2016-09-23 DIAGNOSIS — I2510 Atherosclerotic heart disease of native coronary artery without angina pectoris: Secondary | ICD-10-CM | POA: Diagnosis not present

## 2016-09-23 DIAGNOSIS — I441 Atrioventricular block, second degree: Secondary | ICD-10-CM | POA: Diagnosis not present

## 2016-09-23 DIAGNOSIS — I5032 Chronic diastolic (congestive) heart failure: Secondary | ICD-10-CM

## 2016-09-23 NOTE — Patient Instructions (Signed)
Stop taking Plavix. Continue your other therapy  I will see you in 6 months.    

## 2016-11-17 ENCOUNTER — Ambulatory Visit (INDEPENDENT_AMBULATORY_CARE_PROVIDER_SITE_OTHER): Payer: Medicare HMO | Admitting: *Deleted

## 2016-11-17 DIAGNOSIS — I441 Atrioventricular block, second degree: Secondary | ICD-10-CM | POA: Diagnosis not present

## 2016-11-17 DIAGNOSIS — I5032 Chronic diastolic (congestive) heart failure: Secondary | ICD-10-CM

## 2016-11-17 NOTE — Progress Notes (Signed)
Remote pacemaker transmission.   

## 2016-11-18 LAB — CUP PACEART REMOTE DEVICE CHECK
Battery Remaining Longevity: 111 mo
Brady Statistic AP VP Percent: 30.16 %
Brady Statistic AP VS Percent: 0.01 %
Brady Statistic AS VS Percent: 0.25 %
Brady Statistic RV Percent Paced: 99.71 %
Date Time Interrogation Session: 20180919034849
Implantable Lead Implant Date: 20171214
Implantable Lead Location: 753858
Implantable Lead Location: 753860
Implantable Lead Model: 5076
Implantable Lead Model: 5076
Lead Channel Impedance Value: 266 Ohm
Lead Channel Impedance Value: 304 Ohm
Lead Channel Impedance Value: 323 Ohm
Lead Channel Impedance Value: 342 Ohm
Lead Channel Impedance Value: 380 Ohm
Lead Channel Impedance Value: 494 Ohm
Lead Channel Impedance Value: 494 Ohm
Lead Channel Pacing Threshold Amplitude: 1 V
Lead Channel Pacing Threshold Pulse Width: 0.4 ms
Lead Channel Pacing Threshold Pulse Width: 0.4 ms
Lead Channel Pacing Threshold Pulse Width: 0.4 ms
Lead Channel Sensing Intrinsic Amplitude: 14.375 mV
Lead Channel Sensing Intrinsic Amplitude: 2.625 mV
Lead Channel Sensing Intrinsic Amplitude: 2.625 mV
Lead Channel Setting Pacing Amplitude: 1.5 V
Lead Channel Setting Pacing Amplitude: 2 V
Lead Channel Setting Pacing Pulse Width: 0.4 ms
Lead Channel Setting Sensing Sensitivity: 2.8 mV
MDC IDC LEAD IMPLANT DT: 20171214
MDC IDC LEAD IMPLANT DT: 20171214
MDC IDC LEAD LOCATION: 753859
MDC IDC MSMT BATTERY VOLTAGE: 3.04 V
MDC IDC MSMT LEADCHNL LV IMPEDANCE VALUE: 323 Ohm
MDC IDC MSMT LEADCHNL LV IMPEDANCE VALUE: 532 Ohm
MDC IDC MSMT LEADCHNL LV IMPEDANCE VALUE: 532 Ohm
MDC IDC MSMT LEADCHNL LV IMPEDANCE VALUE: 551 Ohm
MDC IDC MSMT LEADCHNL RA IMPEDANCE VALUE: 437 Ohm
MDC IDC MSMT LEADCHNL RA PACING THRESHOLD AMPLITUDE: 0.375 V
MDC IDC MSMT LEADCHNL RV IMPEDANCE VALUE: 380 Ohm
MDC IDC MSMT LEADCHNL RV IMPEDANCE VALUE: 456 Ohm
MDC IDC MSMT LEADCHNL RV PACING THRESHOLD AMPLITUDE: 0.625 V
MDC IDC MSMT LEADCHNL RV SENSING INTR AMPL: 14.375 mV
MDC IDC PG IMPLANT DT: 20171214
MDC IDC SET LEADCHNL LV PACING PULSEWIDTH: 0.4 ms
MDC IDC SET LEADCHNL RV PACING AMPLITUDE: 2.5 V
MDC IDC STAT BRADY AS VP PERCENT: 69.57 %
MDC IDC STAT BRADY RA PERCENT PACED: 30.09 %

## 2016-11-19 ENCOUNTER — Encounter: Payer: Self-pay | Admitting: Cardiology

## 2016-12-27 ENCOUNTER — Telehealth: Payer: Self-pay | Admitting: Cardiology

## 2016-12-27 ENCOUNTER — Other Ambulatory Visit: Payer: Self-pay | Admitting: *Deleted

## 2016-12-27 MED ORDER — FUROSEMIDE 40 MG PO TABS: 40.0000 mg | ORAL_TABLET | ORAL | 3 refills | Status: DC

## 2016-12-27 NOTE — Telephone Encounter (Signed)
New Message   *STAT* If patient is at the pharmacy, call can be transferred to refill team.   1. Which medications need to be refilled? (please list name of each medication and dose if known) per pt states he was prescribed this medication in the hospital 01/2016. He does not know the name of the medication   2. Which pharmacy/location (including street and city if local pharmacy) is medication to be sent to? [facility] [address]  3. Do they need a 30 day or 90 day supply? 90

## 2017-01-26 ENCOUNTER — Encounter: Payer: Self-pay | Admitting: Internal Medicine

## 2017-03-14 ENCOUNTER — Encounter: Payer: Self-pay | Admitting: Endocrinology

## 2017-03-14 LAB — HM DIABETES EYE EXAM

## 2017-03-16 ENCOUNTER — Encounter: Payer: Medicare HMO | Admitting: Internal Medicine

## 2017-03-20 NOTE — Progress Notes (Signed)
Cardiology Office Note    Date:  03/21/2017   ID:  [name] [name], DOB 02/01/1951, MRN [id number]  PCP:  Devra DoppHowell, Tamieka, MD  Cardiologist:  Dr. SwazilandJordan EP: Dr. Ladona Ridgelaylor  Chief Complaint  Patient presents with  . Coronary Artery Disease  . Hypertension    History of Present Illness:  [name] [name] is a 67 y.o. male with PMH of CAD, ICD, HTN, HLD, DM, CKD, PAF, and bradycardia with h/o mobitz II AV block. He is not on ACE inhibitor due to renal disease. He has suffered a NSTEMI in January 2014 demonstrating multivessel CAD with occluded RCA with collaterals and a high-grade stenosis at the ostium of the first diagonal branch. There was also moderate 70% ostial left circumflex lesion. Medical therapy was recommended along with subsequent Myoview for assessment of ischemic burden. Nuclear study demonstrating inferior and inferoseptal scar with minimal peri-infarct ischemia. Ejection fraction on echocardiogram was 45-50%. He had a repeat echocardiogram in August 2016 that showed EF 50-55%, severe left atrial enlargement, PASP 37 mmHg. He was  seen on 01/20/2016, at which time he appeared to have massive lower extremity edema and elevated JVP consistent with right-sided heart failure. His Lasix was increased to 40 mg twice a day.  He was seen by Dr. Ladona Ridgelaylor in the office on 02/04/2016, due to symptomatic 2:1 AV block, pacemaker was recommended with attempt of His bundle pacing. He eventually underwent successful Medtronic biventricular CRT-P insertion on 02/12/2016. Repeat Echo in January 2018 showed normal LV systolic function, diastolic dysfunction, mild MR and mild pulmonary HTN. He was admitted in April 2018 with RUE DVT felt to be related to pacemaker. He was placed on Xarelto. Last pacemaker check in September 2018  showed Afib <0.1%. 100% BiV paced.   On follow up today he is doing very well. He has been started on insulin Evaristo Bury(Tresiba). He is no longer on glimiperide and Trulicity. He has improvement in his  blood sugars and A1c but has gained weight. He denies any swelling, chest pain, dyspnea, or palpitations. Remains on Xarelto for DVT. He is working out regularly.    Past Medical History:  Diagnosis Date  . Arthritis   . CKD (chronic kidney disease) stage 3, GFR 30-59 ml/min (HCC)    CKD  . Coronary artery disease    s/p NSTEMI with multivessel CAD wit hoccluded RCA; myoview with inferior and inferoseptal infarct with minimal peri infarct ishemia - managed medically  . Diabetes mellitus   . Diabetes mellitus type 2 in obese (HCC)   . Dysrhythmia 03/2012   bradycardia  . Heart murmur   . History of echocardiogram    Echo 8/16:  Mild LVH, EF 50-55%, trivial AI, mild MR, severe LAE, mild RAE, PASP 37 mmHg  //  Echo 03/02/16: Mild concentric LVH, EF 50-55, normal wall motion, grade 2 diastolic dysfunction, mild MR, moderate to severe LAE, PASP 35  . Hypertension   . LV dysfunction Jan 2014   EF is 45 to 50% per echo  . Myocardial infarction (HCC) 03/2012  . Neuromuscular disorder (HCC)    TREMORS  . Obese   . PAF (paroxysmal atrial fibrillation) [facility](HCC) Jan 2014   Brief episode - no anticoagulation    Past Surgical History:  Procedure Laterality Date  . CARDIAC CATHETERIZATION  03/21/2012  . EP IMPLANTABLE DEVICE N/A 02/12/2016   Procedure: BiV Pacemaker Insertion CRT-P;  Surgeon: Marinus MawGregg W Taylor, MD;  [facility];  Service: Cardiovascular;  Laterality: N/A;  .  LEFT HEART CATHETERIZATION WITH CORONARY ANGIOGRAM N/A 03/21/2012   Procedure: LEFT HEART CATHETERIZATION WITH CORONARY ANGIOGRAM;  Surgeon: Takisha Pelle M Swaziland, MD;  Location: [facility] CATH LAB;  Service: Cardiovascular;  Laterality: N/A;  . RIB FRACTURE SURGERY    . s/p knee surgery for torn ligament      Current Medications: Outpatient Medications Prior to Visit  Medication Sig Dispense Refill  . amLODipine (NORVASC) 10 MG tablet Take 10 mg by mouth daily.     Marland Kitchen atorvastatin (LIPITOR) 80 MG tablet Take 1 tablet (80 mg  total) by mouth daily at 6 PM. 30 tablet 6  . BAYER MICROLET LANCETS lancets Use as instructed to check blood sugar once daily dx code 250.42 100 each 1  . Dulaglutide 1.5 MG/0.5ML SOPN Inject 0.5 mLs into the skin once a week.    . furosemide (LASIX) 40 MG tablet Take 1 tablet (40 mg total) by mouth every other day. 45 tablet 3  . glucose blood (ONETOUCH VERIO) test strip Use as instructed to check blood sugar once a day dx code E11.29 100 each 1  . insulin degludec (TRESIBA) 100 UNIT/ML SOPN FlexTouch Pen Inject 20 Units into the skin daily.    . isosorbide mononitrate (IMDUR) 60 MG 24 hr tablet Take 1 tablet (60 mg total) by mouth daily. 30 tablet 6  . losartan (COZAAR) 25 MG tablet Take 1 tablet (25 mg total) by mouth daily. NEED OV. 90 tablet 0  . Multiple Vitamin (MULTIVITAMIN WITH MINERALS) TABS Take 1 tablet by mouth daily.    [name] 20 MG TABS tablet TAKE 1 TABLET BY MOUTH WITH SUPPER 90 tablet 1  . glimepiride (AMARYL) 2 MG tablet Take by mouth.     No facility-administered medications prior to visit.      Allergies:   Metformin and related   Social History   Socioeconomic History  . Marital status: Married    Spouse name: None  . Number of children: None  . Years of education: None  . Highest education level: None  Social Needs  . Financial resource strain: None  . Food insecurity - worry: None  . Food insecurity - inability: None  . Transportation needs - medical: None  . Transportation needs - non-medical: None  Occupational History  . None  Tobacco Use  . Smoking status: Former Smoker    Packs/day: 0.25    Years: 6.00    Pack years: 1.50    Types: Cigarettes    Start date: 06/30/1966    Last attempt to quit: 06/29/1972    Years since quitting: 44.7  . Smokeless tobacco: Never Used  Substance and Sexual Activity  . Alcohol use: No  . Drug use: No  . Sexual activity: No  Other Topics Concern  . None  Social History Narrative  . None     Family History:   The patient's family history includes Heart disease in his father.   ROS:   Please see the history of present illness.    ROS All other systems reviewed and are negative.   PHYSICAL EXAM:   VS:  BP (!) 158/70   Pulse 76   Ht 6\' 1"  (1.854 m)   Wt 255 lb (115.7 kg)   BMI 33.64 kg/m    GENERAL:  Well appearing BM in NAD HEENT:  PERRL, EOMI, sclera are clear. Oropharynx is clear. NECK:  No jugular venous distention, carotid upstroke brisk and symmetric, no bruits, no thyromegaly or adenopathy LUNGS:  Clear  to auscultation bilaterally CHEST:  Unremarkable HEART:  RRR,  PMI not displaced or sustained,S1 and S2 within normal limits, no S3, no S4: no clicks, no rubs, no murmurs ABD:  Soft, nontender. BS +, no masses or bruits. No hepatomegaly, no splenomegaly EXT:  2 + pulses throughout, no edema, no cyanosis no clubbing SKIN:  Warm and dry.  No rashes NEURO:  Alert and oriented x 3. Cranial nerves II through XII intact. PSYCH:  Cognitively intact    Wt Readings from Last 3 Encounters:  03/21/17 255 lb (115.7 kg)  09/23/16 236 lb 9.6 oz (107.3 kg)  06/19/16 228 lb 11.2 oz (103.7 kg)      Studies/Labs Reviewed:   EKG:  EKG is not ordered today.   Recent Labs: 06/19/2016: BUN 32; Creatinine, Ser 1.73; Hemoglobin 9.7; Platelets 177; Potassium 3.8; Sodium 134   Lipid Panel    Component Value Date/Time   CHOL 95 03/20/2015 0846   TRIG 55.0 03/20/2015 0846   HDL 34.30 (L) 03/20/2015 0846   CHOLHDL 3 03/20/2015 0846   VLDL 11.0 03/20/2015 0846   LDLCALC 49 03/20/2015 0846   Labs dated 11/14/15: cholesterol 77, triglycerides 57, HDL 30, LDL 36.  Dated 07/22/16: BUN 48, creatinine 2.14. Glucose 171, A1c 9.5%. Dated 02/11/17: A1c 7.8% Dated 01/05/17: cholesterol 104, triglycerides 50, HDL 41, LDL 53. BUN 39, creatinine 2.06.    Additional studies/ records that were reviewed today include:   Lipid panel dated 11/06/15: cholesterol 77, triglycerides 57, HDL 30, LDL 36.  Echo  03/02/16:Study Conclusions  - Left ventricle: The cavity size was normal. There was mild   concentric hypertrophy. Systolic function was normal. The   estimated ejection fraction was in the range of 50% to 55%. Wall   motion was normal; there were no regional wall motion   abnormalities. Features are consistent with a pseudonormal left   ventricular filling pattern, with concomitant abnormal relaxation   and increased filling pressure (grade 2 diastolic dysfunction). - Mitral valve: There was mild regurgitation directed centrally. - Left atrium: The atrium was moderately to severely dilated. - Pulmonary arteries: Systolic pressure was mildly increased. PA   peak pressure: 35 mm Hg (S).   ASSESSMENT:    1. Chronic diastolic CHF (congestive heart failure) (HCC)   2. Coronary artery disease involving native coronary artery of native heart without angina pectoris   3. Essential hypertension   4. Cardiac pacemaker   5. Mobitz type 2 second degree atrioventricular block      PLAN:  In order of problems listed above:  1. Chronic diastolic heart failure: Last Echo showed normal LV systolic function.  He is well compensated with no edema.  Weight gain most likely related to starting insulin.   Continue sodium and fluid restriction. 2. 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 3. Coronary artery disease: Previous cardiac catheterization in 2014, three-vessel disease with high degree stenosis in some branch vessels, currently on medical therapy. Asymptomatic.  4. CKD stage III- stable 5. DM II: on Insulin now. Per primary care. 6. Hyperlipidemia continue atorvastatin 80 mg daily. Excellent control. 7. RUE DVT- likely related to pacer leads. On Xarelto. 8. Paroxysmal Afib- infrequent- on chronic anticoagulation.    Medication Adjustments/Labs and Tests Ordered: Current medicines are reviewed at length with the patient today.  Concerns regarding medicines are outlined  above.  Medication changes, Labs and Tests ordered today are listed in the Patient Instructions below. Patient Instructions  Continue your current therapy  I will see you in 6 months      Signed, Duard Spiewak Sw[place], MD  03/21/2017 1:51 PM    [facility]

## 2017-03-21 ENCOUNTER — Encounter: Payer: Self-pay | Admitting: Cardiology

## 2017-03-21 ENCOUNTER — Ambulatory Visit: Payer: Medicare HMO | Admitting: Cardiology

## 2017-03-21 VITALS — BP 158/70 | HR 76 | Ht 73.0 in | Wt 255.0 lb

## 2017-03-21 DIAGNOSIS — I2510 Atherosclerotic heart disease of native coronary artery without angina pectoris: Secondary | ICD-10-CM

## 2017-03-21 DIAGNOSIS — I5032 Chronic diastolic (congestive) heart failure: Secondary | ICD-10-CM | POA: Diagnosis not present

## 2017-03-21 DIAGNOSIS — Z950 Presence of cardiac pacemaker: Secondary | ICD-10-CM

## 2017-03-21 DIAGNOSIS — I10 Essential (primary) hypertension: Secondary | ICD-10-CM

## 2017-03-21 DIAGNOSIS — I441 Atrioventricular block, second degree: Secondary | ICD-10-CM | POA: Diagnosis not present

## 2017-03-21 NOTE — Patient Instructions (Signed)
Continue your current therapy  I will see you in 6 months.   

## 2017-04-19 ENCOUNTER — Ambulatory Visit (INDEPENDENT_AMBULATORY_CARE_PROVIDER_SITE_OTHER): Payer: Medicare HMO | Admitting: *Deleted

## 2017-04-19 ENCOUNTER — Telehealth: Payer: Self-pay | Admitting: Cardiology

## 2017-04-19 DIAGNOSIS — I441 Atrioventricular block, second degree: Secondary | ICD-10-CM

## 2017-04-19 DIAGNOSIS — I5032 Chronic diastolic (congestive) heart failure: Secondary | ICD-10-CM | POA: Diagnosis not present

## 2017-04-19 NOTE — Telephone Encounter (Signed)
Spoke with pt and reminded pt of remote transmission that is due today. Pt verbalized understanding.   

## 2017-04-20 LAB — CUP PACEART REMOTE DEVICE CHECK
Battery Remaining Longevity: 106 mo
Brady Statistic AP VP Percent: 15.61 %
Brady Statistic AP VS Percent: 0.01 %
Brady Statistic AS VP Percent: 83.88 %
Brady Statistic AS VS Percent: 0.5 %
Date Time Interrogation Session: 20190219200708
Implantable Lead Implant Date: 20171214
Implantable Lead Implant Date: 20171214
Implantable Lead Location: 753859
Implantable Lead Location: 753860
Implantable Pulse Generator Implant Date: 20171214
Lead Channel Impedance Value: 285 Ohm
Lead Channel Impedance Value: 342 Ohm
Lead Channel Impedance Value: 342 Ohm
Lead Channel Impedance Value: 361 Ohm
Lead Channel Impedance Value: 456 Ohm
Lead Channel Impedance Value: 494 Ohm
Lead Channel Impedance Value: 513 Ohm
Lead Channel Impedance Value: 532 Ohm
Lead Channel Pacing Threshold Amplitude: 0.5 V
Lead Channel Pacing Threshold Amplitude: 0.5 V
Lead Channel Pacing Threshold Amplitude: 0.875 V
Lead Channel Pacing Threshold Pulse Width: 0.4 ms
Lead Channel Sensing Intrinsic Amplitude: 2.125 mV
Lead Channel Sensing Intrinsic Amplitude: 2.125 mV
Lead Channel Setting Pacing Amplitude: 2 V
Lead Channel Setting Pacing Amplitude: 2.5 V
Lead Channel Setting Sensing Sensitivity: 2.8 mV
MDC IDC LEAD IMPLANT DT: 20171214
MDC IDC LEAD LOCATION: 753858
MDC IDC MSMT BATTERY VOLTAGE: 3.01 V
MDC IDC MSMT LEADCHNL LV IMPEDANCE VALUE: 380 Ohm
MDC IDC MSMT LEADCHNL LV IMPEDANCE VALUE: 532 Ohm
MDC IDC MSMT LEADCHNL LV IMPEDANCE VALUE: 551 Ohm
MDC IDC MSMT LEADCHNL LV IMPEDANCE VALUE: 589 Ohm
MDC IDC MSMT LEADCHNL RA IMPEDANCE VALUE: 323 Ohm
MDC IDC MSMT LEADCHNL RA PACING THRESHOLD PULSEWIDTH: 0.4 ms
MDC IDC MSMT LEADCHNL RV IMPEDANCE VALUE: 361 Ohm
MDC IDC MSMT LEADCHNL RV PACING THRESHOLD PULSEWIDTH: 0.4 ms
MDC IDC MSMT LEADCHNL RV SENSING INTR AMPL: 15.625 mV
MDC IDC MSMT LEADCHNL RV SENSING INTR AMPL: 15.625 mV
MDC IDC SET LEADCHNL LV PACING AMPLITUDE: 1.5 V
MDC IDC SET LEADCHNL LV PACING PULSEWIDTH: 0.4 ms
MDC IDC SET LEADCHNL RV PACING PULSEWIDTH: 0.4 ms
MDC IDC STAT BRADY RA PERCENT PACED: 15.73 %
MDC IDC STAT BRADY RV PERCENT PACED: 99.47 %

## 2017-04-20 NOTE — Progress Notes (Signed)
Remote pacemaker transmission.   

## 2017-04-21 ENCOUNTER — Encounter: Payer: Self-pay | Admitting: Cardiology

## 2017-04-22 ENCOUNTER — Encounter: Payer: Self-pay | Admitting: Internal Medicine

## 2017-04-22 ENCOUNTER — Encounter (INDEPENDENT_AMBULATORY_CARE_PROVIDER_SITE_OTHER): Payer: Self-pay

## 2017-04-22 ENCOUNTER — Ambulatory Visit: Payer: Medicare HMO | Admitting: Internal Medicine

## 2017-04-22 VITALS — BP 151/76 | HR 76 | Ht 73.0 in | Wt 257.0 lb

## 2017-04-22 DIAGNOSIS — Z950 Presence of cardiac pacemaker: Secondary | ICD-10-CM

## 2017-04-22 DIAGNOSIS — I441 Atrioventricular block, second degree: Secondary | ICD-10-CM | POA: Diagnosis not present

## 2017-04-22 LAB — CUP PACEART INCLINIC DEVICE CHECK
Battery Voltage: 3.01 V
Brady Statistic AP VS Percent: 0.01 %
Brady Statistic AS VP Percent: 80.64 %
Brady Statistic AS VS Percent: 0.39 %
Implantable Lead Implant Date: 20171214
Implantable Lead Implant Date: 20171214
Implantable Lead Implant Date: 20171214
Implantable Lead Location: 753858
Implantable Lead Location: 753859
Implantable Lead Location: 753860
Implantable Lead Model: 5076
Implantable Lead Model: 5076
Lead Channel Impedance Value: 456 Ohm
Lead Channel Impedance Value: 456 Ohm
Lead Channel Impedance Value: 532 Ohm
Lead Channel Impedance Value: 551 Ohm
Lead Channel Impedance Value: 589 Ohm
Lead Channel Pacing Threshold Amplitude: 0.875 V
Lead Channel Pacing Threshold Pulse Width: 0.4 ms
Lead Channel Sensing Intrinsic Amplitude: 1 mV
Lead Channel Sensing Intrinsic Amplitude: 15.625 mV
Lead Channel Sensing Intrinsic Amplitude: 15.625 mV
Lead Channel Setting Pacing Amplitude: 1.5 V
Lead Channel Setting Pacing Amplitude: 2 V
Lead Channel Setting Pacing Amplitude: 2.5 V
MDC IDC MSMT BATTERY REMAINING LONGEVITY: 106 mo
MDC IDC MSMT LEADCHNL LV IMPEDANCE VALUE: 380 Ohm
MDC IDC MSMT LEADCHNL LV IMPEDANCE VALUE: 551 Ohm
MDC IDC MSMT LEADCHNL LV IMPEDANCE VALUE: 570 Ohm
MDC IDC MSMT LEADCHNL RA IMPEDANCE VALUE: 323 Ohm
MDC IDC MSMT LEADCHNL RA PACING THRESHOLD AMPLITUDE: 0.5 V
MDC IDC MSMT LEADCHNL RA PACING THRESHOLD PULSEWIDTH: 0.4 ms
MDC IDC MSMT LEADCHNL RA SENSING INTR AMPL: 3.5 mV
MDC IDC MSMT LEADCHNL RV IMPEDANCE VALUE: 380 Ohm
MDC IDC MSMT LEADCHNL RV PACING THRESHOLD AMPLITUDE: 0.5 V
MDC IDC MSMT LEADCHNL RV PACING THRESHOLD PULSEWIDTH: 0.4 ms
MDC IDC PG IMPLANT DT: 20171214
MDC IDC SESS DTM: 20190222115711
MDC IDC SET LEADCHNL LV PACING PULSEWIDTH: 0.4 ms
MDC IDC SET LEADCHNL RV PACING PULSEWIDTH: 0.4 ms
MDC IDC SET LEADCHNL RV SENSING SENSITIVITY: 2.8 mV
MDC IDC STAT BRADY AP VP PERCENT: 18.96 %
MDC IDC STAT BRADY RA PERCENT PACED: 18.98 %
MDC IDC STAT BRADY RV PERCENT PACED: 99.57 %

## 2017-04-22 MED ORDER — FUROSEMIDE 40 MG PO TABS: 20.0000 mg | ORAL_TABLET | Freq: Every day | ORAL | 3 refills | Status: DC

## 2017-04-22 NOTE — Progress Notes (Signed)
HPI 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 In the interim, he has done well with no syncope, chest pain or sob. He admits to consuming too much caffeine. He has minimal peripheral edema. His voice remains hoarse Allergies  Allergen Reactions  . Metformin And Related Other (See Comments)    Renal insufficiency     Current Outpatient Medications  Medication Sig Dispense Refill  . amLODipine (NORVASC) 10 MG tablet Take 10 mg by mouth daily.     Marland Kitchen. atorvastatin (LIPITOR) 80 MG tablet Take 1 tablet (80 mg total) by mouth daily at 6 PM. 30 tablet 6  . BAYER MICROLET LANCETS lancets Use as instructed to check blood sugar once daily dx code 250.42 100 each 1  . Dulaglutide 1.5 MG/0.5ML SOPN Inject 0.5 mLs into the skin once a week.    . furosemide (LASIX) 40 MG tablet Take 0.5 tablets (20 mg total) by mouth daily. 45 tablet 3  . glucose blood (ONETOUCH VERIO) test strip Use as instructed to check blood sugar once a day dx code E11.29 100 each 1  . insulin degludec (TRESIBA) 100 UNIT/ML SOPN FlexTouch Pen Inject 20 Units into the skin daily.    . isosorbide mononitrate (IMDUR) 60 MG 24 hr tablet Take 1 tablet (60 mg total) by mouth daily. 30 tablet 6  . losartan (COZAAR) 25 MG tablet Take 1 tablet (25 mg total) by mouth daily. NEED OV. 90 tablet 0  . Multiple Vitamin (MULTIVITAMIN WITH MINERALS) TABS Take 1 tablet by mouth daily.    [name] [name]. XARELTO 20 MG TABS tablet TAKE 1 TABLET BY MOUTH WITH SUPPER 90 tablet 1   No current facility-administered medications for this visit.      Past Medical History:  Diagnosis Date  . Arthritis   . CKD (chronic kidney disease) stage 3, GFR 30-59 ml/min (HCC)    CKD  . Coronary artery disease    s/p NSTEMI with multivessel CAD wit hoccluded RCA; myoview with inferior and inferoseptal infarct with minimal peri infarct ishemia - managed medically  .  Diabetes mellitus   . Diabetes mellitus type 2 in obese (HCC)   . Dysrhythmia 03/2012   bradycardia  . Heart murmur   . History of echocardiogram    Echo 8/16:  Mild LVH, EF 50-55%, trivial AI, mild MR, severe LAE, mild RAE, PASP 37 mmHg  //  Echo 03/02/16: Mild concentric LVH, EF 50-55, normal wall motion, grade 2 diastolic dysfunction, mild MR, moderate to severe LAE, PASP 35  . Hypertension   . LV dysfunction Jan 2014   EF is 45 to 50% per echo  . Myocardial infarction (HCC) 03/2012  . Neuromuscular disorder (HCC)    TREMORS  . Obese   . PAF (paroxysmal atrial fibrillation) (HCC) Jan 2014   Brief episode - no anticoagulation    ROS:   All systems reviewed and negative except as noted in the HPI.   Past Surgical History:  Procedure Laterality Date  . CARDIAC CATHETERIZATION  03/21/2012  . EP IMPLANTABLE DEVICE N/A 02/12/2016   Procedure: BiV Pacemaker Insertion CRT-P;  Surgeon: Marinus MawGregg W Taylor, MD;  [facility];  Service: Cardiovascular;  Laterality: N/A;  . LEFT HEART CATHETERIZATION WITH CORONARY ANGIOGRAM N/A 03/21/2012   Procedure: LEFT HEART CATHETERIZATION WITH CORONARY ANGIOGRAM;  Surgeon: Peter M SwazilandJordan, MD;  [facility];  Service: Cardiovascular;  Laterality: N/A;  . RIB FRACTURE SURGERY    . s/p knee surgery for torn ligament       Family History  Problem Relation Age of Onset  . Heart disease Father      Social History   Socioeconomic History  . Marital status: Married    Spouse name: Not on file  . Number of children: Not on file  . Years of education: Not on file  . Highest education level: Not on file  Social Needs  . Financial resource strain: Not on file  . Food insecurity - worry: Not on file  . Food insecurity - inability: Not on file  . Transportation needs - medical: Not on file  . Transportation needs - non-medical: Not on file  Occupational History  . Not on file  Tobacco Use  . Smoking status: Former Smoker     Packs/day: 0.25    Years: 6.00    Pack years: 1.50    Types: Cigarettes    Start date: 06/30/1966    Last attempt to quit: 06/29/1972    Years since quitting: 44.8  . Smokeless tobacco: Never Used  Substance and Sexual Activity  . Alcohol use: No  . Drug use: No  . Sexual activity: No  Other Topics Concern  . Not on file  Social History Narrative  . Not on file     BP (!) 151/76   Pulse 76   Ht 6\' 1"  (1.854 m)   Wt 257 lb (116.6 kg)   BMI 33.91 kg/m   Physical Exam:  Well appearing NAD HEENT: Unremarkable Neck:  No JVD, no thyromegally Lymphatics:  No adenopathy Back:  No CVA tenderness Lungs:  Clear with no wheezes HEART:  Regular rate rhythm, no murmurs, no rubs, no clicks Abd:  soft, positive bowel sounds, no organomegally, no rebound, no guarding Ext:  2 plus pulses, no edema, no cyanosis, no clubbing Skin:  No rashes no nodules Neuro:  CN II through XII intact, motor grossly intact  EKG - none  DEVICE  Normal device function.  See PaceArt for details.   Assess/Plan: 1. CHB - he has no escape at 30/min today. He is asymptomatic since his PPM was placed. 2. PPM - his medtronic DDD biv PM is working normally. Will recheck in several months. 3. HTN - his blood pressure is controlled. He will continue his current meds. He admits to dietary indiscretion with sodium and I have asked him to avoid salty foods.   Leonia Reeves.D.

## 2017-04-22 NOTE — Patient Instructions (Signed)

## 2017-07-06 ENCOUNTER — Other Ambulatory Visit: Payer: Self-pay | Admitting: Cardiology

## 2017-07-19 ENCOUNTER — Ambulatory Visit (INDEPENDENT_AMBULATORY_CARE_PROVIDER_SITE_OTHER): Payer: Medicare HMO | Admitting: *Deleted

## 2017-07-19 DIAGNOSIS — I441 Atrioventricular block, second degree: Secondary | ICD-10-CM | POA: Diagnosis not present

## 2017-07-19 NOTE — Progress Notes (Signed)
Remote pacemaker transmission.   

## 2017-07-29 LAB — CUP PACEART REMOTE DEVICE CHECK
Battery Remaining Longevity: 101 mo
Battery Voltage: 3.01 V
Brady Statistic AS VS Percent: 0.95 %
Brady Statistic RV Percent Paced: 99.02 %
Implantable Lead Implant Date: 20171214
Implantable Lead Location: 753858
Implantable Lead Location: 753859
Implantable Lead Model: 4598
Implantable Lead Model: 5076
Lead Channel Impedance Value: 266 Ohm
Lead Channel Impedance Value: 323 Ohm
Lead Channel Impedance Value: 361 Ohm
Lead Channel Impedance Value: 437 Ohm
Lead Channel Impedance Value: 513 Ohm
Lead Channel Impedance Value: 551 Ohm
Lead Channel Impedance Value: 570 Ohm
Lead Channel Pacing Threshold Amplitude: 1 V
Lead Channel Pacing Threshold Pulse Width: 0.4 ms
Lead Channel Sensing Intrinsic Amplitude: 14.625 mV
Lead Channel Setting Pacing Amplitude: 1.5 V
Lead Channel Setting Pacing Amplitude: 2.5 V
Lead Channel Setting Pacing Pulse Width: 0.4 ms
Lead Channel Setting Pacing Pulse Width: 0.4 ms
MDC IDC LEAD IMPLANT DT: 20171214
MDC IDC LEAD IMPLANT DT: 20171214
MDC IDC LEAD LOCATION: 753860
MDC IDC MSMT LEADCHNL LV IMPEDANCE VALUE: 323 Ohm
MDC IDC MSMT LEADCHNL LV IMPEDANCE VALUE: 342 Ohm
MDC IDC MSMT LEADCHNL LV IMPEDANCE VALUE: 361 Ohm
MDC IDC MSMT LEADCHNL LV IMPEDANCE VALUE: 494 Ohm
MDC IDC MSMT LEADCHNL LV IMPEDANCE VALUE: 513 Ohm
MDC IDC MSMT LEADCHNL LV PACING THRESHOLD PULSEWIDTH: 0.4 ms
MDC IDC MSMT LEADCHNL RA IMPEDANCE VALUE: 323 Ohm
MDC IDC MSMT LEADCHNL RA IMPEDANCE VALUE: 437 Ohm
MDC IDC MSMT LEADCHNL RA PACING THRESHOLD AMPLITUDE: 0.5 V
MDC IDC MSMT LEADCHNL RA SENSING INTR AMPL: 2.5 mV
MDC IDC MSMT LEADCHNL RA SENSING INTR AMPL: 2.5 mV
MDC IDC MSMT LEADCHNL RV PACING THRESHOLD AMPLITUDE: 0.5 V
MDC IDC MSMT LEADCHNL RV PACING THRESHOLD PULSEWIDTH: 0.4 ms
MDC IDC MSMT LEADCHNL RV SENSING INTR AMPL: 14.625 mV
MDC IDC PG IMPLANT DT: 20171214
MDC IDC SESS DTM: 20190521061129
MDC IDC SET LEADCHNL RA PACING AMPLITUDE: 2 V
MDC IDC SET LEADCHNL RV SENSING SENSITIVITY: 2.8 mV
MDC IDC STAT BRADY AP VP PERCENT: 27.51 %
MDC IDC STAT BRADY AP VS PERCENT: 0.02 %
MDC IDC STAT BRADY AS VP PERCENT: 71.52 %
MDC IDC STAT BRADY RA PERCENT PACED: 27.89 %

## 2017-10-18 ENCOUNTER — Telehealth: Payer: Self-pay | Admitting: Cardiology

## 2017-10-18 ENCOUNTER — Ambulatory Visit (INDEPENDENT_AMBULATORY_CARE_PROVIDER_SITE_OTHER): Payer: Medicare HMO | Admitting: *Deleted

## 2017-10-18 DIAGNOSIS — I441 Atrioventricular block, second degree: Secondary | ICD-10-CM | POA: Diagnosis not present

## 2017-10-18 DIAGNOSIS — I5032 Chronic diastolic (congestive) heart failure: Secondary | ICD-10-CM

## 2017-10-18 NOTE — Telephone Encounter (Signed)
Spoke with pt and reminded pt of remote transmission that is due today. Pt verbalized understanding.   

## 2017-10-19 ENCOUNTER — Encounter: Payer: Self-pay | Admitting: Cardiology

## 2017-10-20 NOTE — Progress Notes (Signed)
Remote pacemaker transmission.   

## 2017-11-12 LAB — CUP PACEART REMOTE DEVICE CHECK
Battery Voltage: 3 V
Brady Statistic AP VP Percent: 25.3 %
Brady Statistic AS VP Percent: 73.29 %
Brady Statistic AS VS Percent: 1.39 %
Date Time Interrogation Session: 20190822055753
Implantable Lead Implant Date: 20171214
Implantable Lead Location: 753859
Implantable Lead Location: 753860
Implantable Lead Model: 5076
Implantable Pulse Generator Implant Date: 20171214
Lead Channel Impedance Value: 323 Ohm
Lead Channel Impedance Value: 323 Ohm
Lead Channel Impedance Value: 323 Ohm
Lead Channel Impedance Value: 342 Ohm
Lead Channel Impedance Value: 437 Ohm
Lead Channel Impedance Value: 513 Ohm
Lead Channel Impedance Value: 551 Ohm
Lead Channel Pacing Threshold Amplitude: 0.625 V
Lead Channel Pacing Threshold Amplitude: 1.125 V
Lead Channel Pacing Threshold Pulse Width: 0.4 ms
Lead Channel Pacing Threshold Pulse Width: 0.4 ms
Lead Channel Sensing Intrinsic Amplitude: 16.375 mV
Lead Channel Sensing Intrinsic Amplitude: 2.75 mV
Lead Channel Sensing Intrinsic Amplitude: 2.75 mV
Lead Channel Setting Pacing Amplitude: 2 V
Lead Channel Setting Pacing Amplitude: 2.5 V
Lead Channel Setting Pacing Pulse Width: 0.4 ms
Lead Channel Setting Sensing Sensitivity: 2.8 mV
MDC IDC LEAD IMPLANT DT: 20171214
MDC IDC LEAD IMPLANT DT: 20171214
MDC IDC LEAD LOCATION: 753858
MDC IDC MSMT BATTERY REMAINING LONGEVITY: 96 mo
MDC IDC MSMT LEADCHNL LV IMPEDANCE VALUE: 285 Ohm
MDC IDC MSMT LEADCHNL LV IMPEDANCE VALUE: 494 Ohm
MDC IDC MSMT LEADCHNL LV IMPEDANCE VALUE: 513 Ohm
MDC IDC MSMT LEADCHNL LV IMPEDANCE VALUE: 532 Ohm
MDC IDC MSMT LEADCHNL RA IMPEDANCE VALUE: 323 Ohm
MDC IDC MSMT LEADCHNL RA IMPEDANCE VALUE: 437 Ohm
MDC IDC MSMT LEADCHNL RV IMPEDANCE VALUE: 342 Ohm
MDC IDC MSMT LEADCHNL RV PACING THRESHOLD AMPLITUDE: 0.5 V
MDC IDC MSMT LEADCHNL RV PACING THRESHOLD PULSEWIDTH: 0.4 ms
MDC IDC MSMT LEADCHNL RV SENSING INTR AMPL: 16.375 mV
MDC IDC SET LEADCHNL LV PACING AMPLITUDE: 1.75 V
MDC IDC SET LEADCHNL LV PACING PULSEWIDTH: 0.4 ms
MDC IDC STAT BRADY AP VS PERCENT: 0.02 %
MDC IDC STAT BRADY RA PERCENT PACED: 26.13 %
MDC IDC STAT BRADY RV PERCENT PACED: 98.57 %

## 2018-01-17 ENCOUNTER — Ambulatory Visit (INDEPENDENT_AMBULATORY_CARE_PROVIDER_SITE_OTHER): Payer: Medicare HMO

## 2018-01-17 DIAGNOSIS — I255 Ischemic cardiomyopathy: Secondary | ICD-10-CM

## 2018-01-17 DIAGNOSIS — I441 Atrioventricular block, second degree: Secondary | ICD-10-CM | POA: Diagnosis not present

## 2018-01-17 NOTE — Progress Notes (Signed)
Remote pacemaker transmission.   

## 2018-03-09 ENCOUNTER — Other Ambulatory Visit: Payer: Self-pay | Admitting: Cardiology

## 2018-03-14 LAB — CUP PACEART REMOTE DEVICE CHECK
Brady Statistic AP VP Percent: 12.51 %
Brady Statistic AP VS Percent: 0.01 %
Brady Statistic AS VP Percent: 86.34 %
Brady Statistic AS VS Percent: 1.13 %
Date Time Interrogation Session: 20191119154621
Implantable Lead Implant Date: 20171214
Implantable Lead Implant Date: 20171214
Implantable Pulse Generator Implant Date: 20171214
Lead Channel Impedance Value: 285 Ohm
Lead Channel Impedance Value: 323 Ohm
Lead Channel Impedance Value: 323 Ohm
Lead Channel Impedance Value: 342 Ohm
Lead Channel Impedance Value: 437 Ohm
Lead Channel Impedance Value: 494 Ohm
Lead Channel Impedance Value: 532 Ohm
Lead Channel Impedance Value: 551 Ohm
Lead Channel Impedance Value: 589 Ohm
Lead Channel Pacing Threshold Amplitude: 0.5 V
Lead Channel Pacing Threshold Amplitude: 1.125 V
Lead Channel Pacing Threshold Pulse Width: 0.4 ms
Lead Channel Pacing Threshold Pulse Width: 0.4 ms
Lead Channel Sensing Intrinsic Amplitude: 16.5 mV
Lead Channel Setting Pacing Amplitude: 1.75 V
Lead Channel Setting Pacing Amplitude: 2 V
Lead Channel Setting Pacing Pulse Width: 0.4 ms
Lead Channel Setting Pacing Pulse Width: 0.4 ms
MDC IDC LEAD IMPLANT DT: 20171214
MDC IDC LEAD LOCATION: 753858
MDC IDC LEAD LOCATION: 753859
MDC IDC LEAD LOCATION: 753860
MDC IDC MSMT BATTERY REMAINING LONGEVITY: 93 mo
MDC IDC MSMT BATTERY VOLTAGE: 3 V
MDC IDC MSMT LEADCHNL LV IMPEDANCE VALUE: 323 Ohm
MDC IDC MSMT LEADCHNL LV IMPEDANCE VALUE: 361 Ohm
MDC IDC MSMT LEADCHNL LV IMPEDANCE VALUE: 361 Ohm
MDC IDC MSMT LEADCHNL LV IMPEDANCE VALUE: 570 Ohm
MDC IDC MSMT LEADCHNL RA SENSING INTR AMPL: 2.5 mV
MDC IDC MSMT LEADCHNL RA SENSING INTR AMPL: 2.5 mV
MDC IDC MSMT LEADCHNL RV IMPEDANCE VALUE: 418 Ohm
MDC IDC MSMT LEADCHNL RV PACING THRESHOLD AMPLITUDE: 0.5 V
MDC IDC MSMT LEADCHNL RV PACING THRESHOLD PULSEWIDTH: 0.4 ms
MDC IDC MSMT LEADCHNL RV SENSING INTR AMPL: 16.5 mV
MDC IDC SET LEADCHNL RV PACING AMPLITUDE: 2.5 V
MDC IDC SET LEADCHNL RV SENSING SENSITIVITY: 2.8 mV
MDC IDC STAT BRADY RA PERCENT PACED: 13.02 %
MDC IDC STAT BRADY RV PERCENT PACED: 98.85 %

## 2018-03-23 LAB — HM DIABETES EYE EXAM

## 2018-04-18 ENCOUNTER — Ambulatory Visit (INDEPENDENT_AMBULATORY_CARE_PROVIDER_SITE_OTHER): Payer: Medicare HMO

## 2018-04-18 DIAGNOSIS — I441 Atrioventricular block, second degree: Secondary | ICD-10-CM | POA: Diagnosis not present

## 2018-04-19 ENCOUNTER — Telehealth: Payer: Self-pay

## 2018-04-19 NOTE — Telephone Encounter (Signed)
Left message for patient to remind of missed remote transmission.  

## 2018-04-20 LAB — CUP PACEART REMOTE DEVICE CHECK
Battery Remaining Longevity: 88 mo
Battery Voltage: 2.99 V
Brady Statistic AP VP Percent: 25.61 %
Brady Statistic AP VS Percent: 0.01 %
Brady Statistic AS VP Percent: 74.08 %
Brady Statistic AS VS Percent: 0.29 %
Brady Statistic RA Percent Paced: 25.6 %
Brady Statistic RV Percent Paced: 99.68 %
Date Time Interrogation Session: 20200220113000
Implantable Lead Implant Date: 20171214
Implantable Lead Implant Date: 20171214
Implantable Lead Implant Date: 20171214
Implantable Lead Location: 753858
Implantable Lead Location: 753859
Implantable Lead Location: 753860
Implantable Lead Model: 4598
Implantable Lead Model: 5076
Implantable Lead Model: 5076
Implantable Pulse Generator Implant Date: 20171214
Lead Channel Impedance Value: 304 Ohm
Lead Channel Impedance Value: 323 Ohm
Lead Channel Impedance Value: 323 Ohm
Lead Channel Impedance Value: 342 Ohm
Lead Channel Impedance Value: 342 Ohm
Lead Channel Impedance Value: 361 Ohm
Lead Channel Impedance Value: 361 Ohm
Lead Channel Impedance Value: 437 Ohm
Lead Channel Impedance Value: 456 Ohm
Lead Channel Impedance Value: 494 Ohm
Lead Channel Impedance Value: 513 Ohm
Lead Channel Impedance Value: 551 Ohm
Lead Channel Impedance Value: 570 Ohm
Lead Channel Impedance Value: 570 Ohm
Lead Channel Pacing Threshold Amplitude: 0.5 V
Lead Channel Pacing Threshold Amplitude: 0.625 V
Lead Channel Pacing Threshold Amplitude: 1.375 V
Lead Channel Pacing Threshold Pulse Width: 0.4 ms
Lead Channel Pacing Threshold Pulse Width: 0.4 ms
Lead Channel Pacing Threshold Pulse Width: 0.4 ms
Lead Channel Sensing Intrinsic Amplitude: 18.125 mV
Lead Channel Sensing Intrinsic Amplitude: 18.125 mV
Lead Channel Sensing Intrinsic Amplitude: 2.75 mV
Lead Channel Sensing Intrinsic Amplitude: 2.75 mV
Lead Channel Setting Pacing Amplitude: 2 V
Lead Channel Setting Pacing Amplitude: 2 V
Lead Channel Setting Pacing Amplitude: 2.5 V
Lead Channel Setting Pacing Pulse Width: 0.4 ms
Lead Channel Setting Pacing Pulse Width: 0.4 ms
Lead Channel Setting Sensing Sensitivity: 2.8 mV

## 2018-04-26 NOTE — Progress Notes (Signed)
Remote pacemaker transmission.   

## 2018-05-01 ENCOUNTER — Encounter: Payer: Self-pay | Admitting: Cardiology

## 2018-05-16 IMAGING — CR DG CHEST 2V
2 series · 2 of 2 positions shown · non-contrast
Comparison: Chest x-ray of October 25, 2007

CLINICAL DATA: Status post pacemaker insertion. History of coronary
artery disease, cardiac dysrhythmia, diabetes, and obesity.

EXAM:
CHEST  2 VIEW

[chest lat]
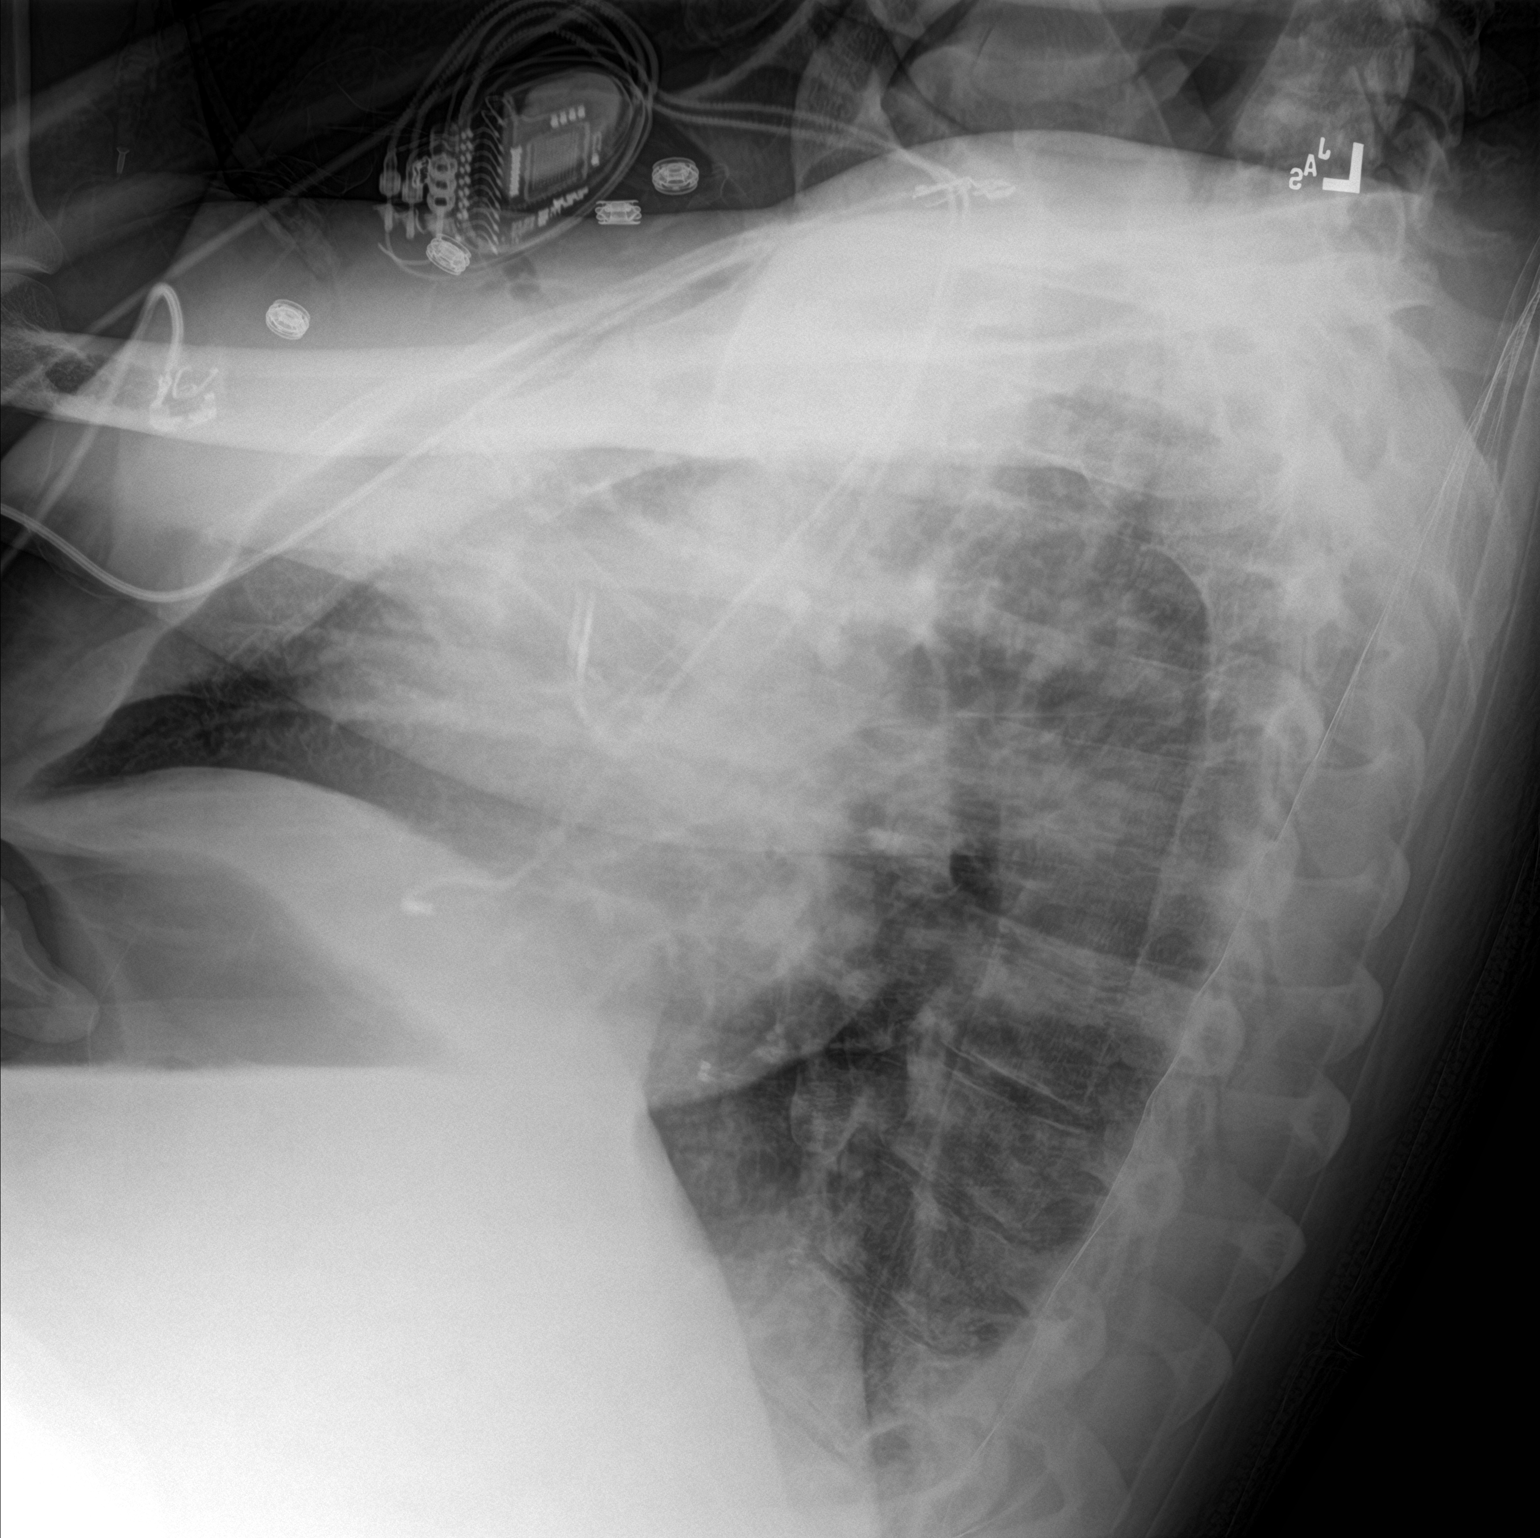

[chest ap]
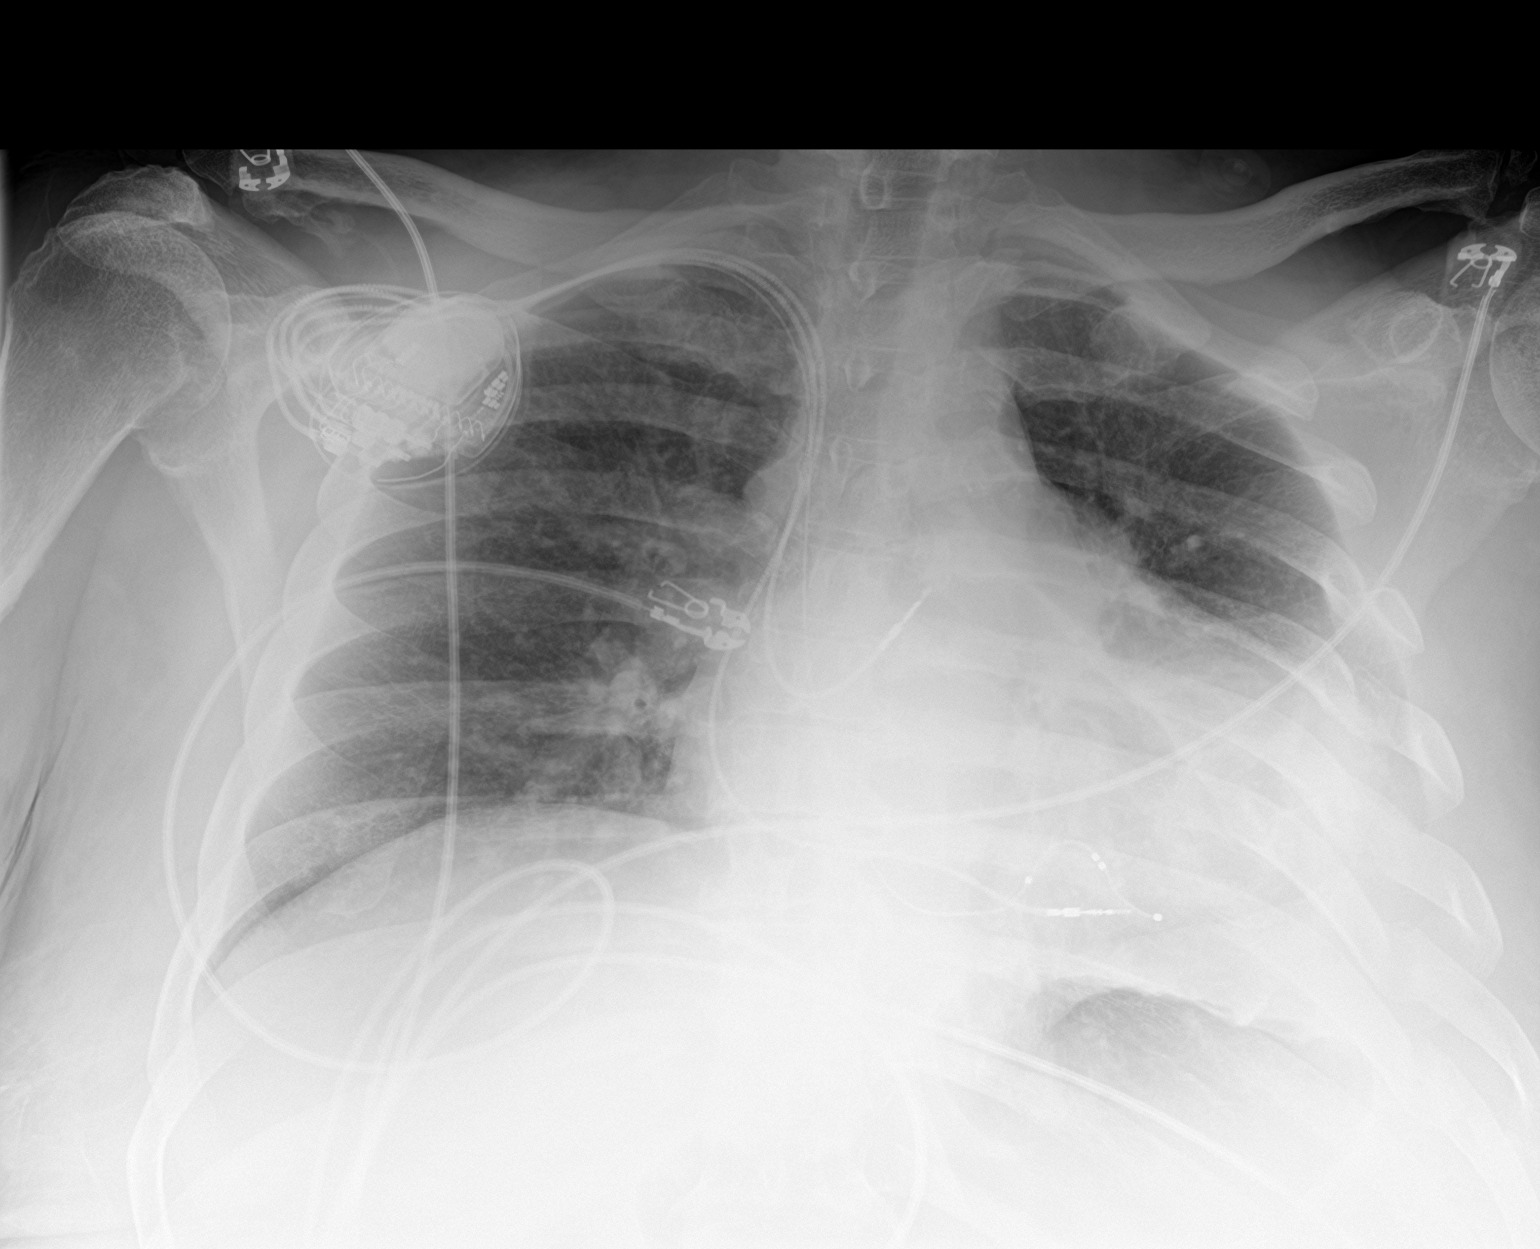

[2 of 2 positions shown; findings below may reference images not displayed]

FINDINGS: The lungs are hypoinflated. There is no pneumothorax. There is
persistent increased density at the left lung base consistent with
atelectasis, infiltrate, or more likely scarring given the abnormal
appearance of the lateral left ribcage. The cardiac silhouette
remains enlarged. The pulmonary vascularity is mildly prominent
centrally. The electrodes are in reasonable position
radiographically.
IMPRESSION: No immediate postprocedure complication following permanent
pacemaker placement.

## 2018-05-19 ENCOUNTER — Encounter: Payer: Self-pay | Admitting: Internal Medicine

## 2018-05-25 ENCOUNTER — Telehealth: Payer: Self-pay

## 2018-05-25 NOTE — Telephone Encounter (Signed)
Left message for pt about setting up a virtual visit. Ask pt to give [place] a call back.

## 2018-05-29 NOTE — Telephone Encounter (Signed)
complete

## 2018-05-29 NOTE — Telephone Encounter (Signed)
Follow up   Patient consents to a telephone call with Dr. Ladona Ridgel as appointment.  YOUR CARDIOLOGY TEAM HAS ARRANGED FOR AN E-VISIT FOR YOUR APPOINTMENT - PLEASE REVIEW IMPORTANT INFORMATION BELOW SEVERAL DAYS PRIOR TO YOUR APPOINTMENT  Due to the recent COVID-19 pandemic, we are transitioning in-person office visits to tele-medicine visits in an effort to decrease unnecessary exposure to our patients and staff. Medicare and most insurances are covering these visits without a copay needed. You will need a smartphone if possible. We also encourage you to sign up for MyChart. For patients that do not have this, we can still complete the visit using a regular telephone but do prefer a smartphone to enable video when possible. You may have a close family member that can help. If possible, we also ask that you have a blood pressure cuff and scale at home to measure your blood pressure, heart rate and weight prior to your scheduled appointment. Patients with clinical needs that need an in-person evaluation and testing will still be able to come to the office if absolutely necessary. If you have any questions, feel free to call our office.    IF YOU HAVE A SMARTPHONE, PLEASE DOWNLOAD THE WEBEX APP TO YOUR SMARTPHONE  - If Apple, go to [employer] and type in WebEx in the search bar. Download [employer], the blue/green circle. The app is free but as with any other app download, your phone may require you to verify saved payment information or Apple password. You do NOT have to create a WebEx account.  - If Android, go to [employer] and type in [employer] in the search bar. Download [employer], the blue/green circle. The app is free but as with any other app download, your phone may require you to verify saved payment information or Android password. You do NOT have to create a WebEx account.  It is very helpful to have this downloaded before your visit.    2-3 DAYS BEFORE YOUR  APPOINTMENT  You will receive a telephone call from one of our [facility] team members - your caller ID may say "Unknown caller." If this is a video visit, we will confirm that you have been able to download the WebEx app. We will remind you check your blood pressure, heart rate and weight prior to your scheduled appointment. If you have an Apple Watch or Kardia, please upload any pertinent ECG strips the day before or morning of your appointment to MyChart. Our staff will also make sure you have reviewed the consent and agree to move forward with your scheduled tele-health visit.     THE DAY OF YOUR APPOINTMENT  Approximately 15 minutes prior to your scheduled appointment, you will receive a telephone call from one of [facility] team - your caller ID may say "Unknown caller."  Our staff will confirm medications, vital signs for the day and any symptoms you may be experiencing. Please have this information available prior to the time of visit start. It may also be helpful for you to have a pad of paper and pen handy for any instructions given during your visit. They will also walk you through joining the WebEx smartphone meeting if this is a video visit.    CONSENT FOR TELE-HEALTH VISIT - PLEASE RVIEW  I hereby voluntarily request, consent and authorize [facility] and its employed or contracted physicians, physician assistants, nurse practitioners or other licensed health care professionals (the Practitioner), to provide me with telemedicine health  care services (the “Services") as deemed necessary by the treating Practitioner. I acknowledge and consent to receive the Services by the Practitioner via telemedicine. I understand that the telemedicine visit will involve communicating with the Practitioner through live audiovisual communication technology and the disclosure of certain medical information by electronic transmission. I acknowledge that I have been given the opportunity to request an  in-person assessment or other available alternative prior to the telemedicine visit and am voluntarily participating in the telemedicine visit. ° °I understand that I have the right to withhold or withdraw my consent to the use of telemedicine in the course of my care at any time, without affecting my right to future care or treatment, and that the Practitioner or I may terminate the telemedicine visit at any time. I understand that I have the right to inspect all information obtained and/or recorded in the course of the telemedicine visit and may receive copies of available information for a reasonable fee.  I understand that some of the potential risks of receiving the Services via telemedicine include:  °• Delay or interruption in medical evaluation due to technological equipment failure or disruption; °• Information transmitted may not be sufficient (e.g. poor resolution of images) to allow for appropriate medical decision making by the Practitioner; and/or  °• In rare instances, security protocols could fail, causing a breach of personal health information. ° °Furthermore, I acknowledge that it is my responsibility to provide information about my medical history, conditions and care that is complete and accurate to the best of my ability. I acknowledge that Practitioner's advice, recommendations, and/or decision may be based on factors not within their control, such as incomplete or inaccurate data provided by me or distortions of diagnostic images or specimens that may result from electronic transmissions. I understand that the practice of medicine is not an exact science and that Practitioner makes no warranties or guarantees regarding treatment outcomes. I acknowledge that I will receive a copy of this consent concurrently upon execution via email to the email address I last provided but may also request a printed copy by calling the office of [facility].   ° °I understand that my insurance will be  billed for this visit.  ° °I have read or had this consent read to me. °• I understand the contents of this consent, which adequately explains the benefits and risks of the Services being provided via telemedicine.  °• I have been provided ample opportunity to ask questions regarding this consent and the Services and have had my questions answered to my satisfaction. °• I give my informed consent for the services to be provided through the use of telemedicine in my medical care ° °By participating in this telemedicine visit I agree to the above. ° °

## 2018-05-29 NOTE — Telephone Encounter (Signed)
Follow up   Called pt about his visit on 03.31.20, he does NOT have access to internet or a smart phone. Pt would like to do a phone visit. [phone] is the best contact number.

## 2018-05-30 ENCOUNTER — Other Ambulatory Visit: Payer: Self-pay

## 2018-05-30 ENCOUNTER — Telehealth (INDEPENDENT_AMBULATORY_CARE_PROVIDER_SITE_OTHER): Payer: Medicare HMO | Admitting: Internal Medicine

## 2018-05-30 DIAGNOSIS — I442 Atrioventricular block, complete: Secondary | ICD-10-CM

## 2018-05-30 DIAGNOSIS — I110 Hypertensive heart disease with heart failure: Secondary | ICD-10-CM

## 2018-05-30 DIAGNOSIS — I5042 Chronic combined systolic (congestive) and diastolic (congestive) heart failure: Secondary | ICD-10-CM | POA: Diagnosis not present

## 2018-05-30 NOTE — Progress Notes (Signed)
Electrophysiology TeleHealth Note   Due to national recommendations of social distancing due to COVID 19, an audio/video telehealth visit is felt to be most appropriate for this patient at this time.  See MyChart message from today for the patient's consent to telehealth for [facility].   Date:  05/30/2018   ID:  Ran Sowells, DOB May 17, 1950, MRN [id number]  Location: patient's home  Provider location: [address] [place]  Evaluation Performed: Follow-up visit  PCP:  Devra Dopp, MD  Cardiologist:  Peter Swaziland, MD  Electrophysiologist:  Dr Leonia Reeves  Chief Complaint:  "tinglling in my feet"  History of Present Illness:    [name] [name] is a 68 y.o. male who presents via audio/video conferencing for a telehealth visit today. He has a h/o HTN and DM and CHB, S/P PPM insertion. He notes some tingling his his feet which is not related to exertion. He has no trouble walking. Since last being seen in our clinic, the patient reports doing very well.  Today, he denies symptoms of palpitations, chest pain, shortness of breath,  lower extremity edema, dizziness, presyncope, or syncope.  The patient is otherwise without complaint today.  The patient denies symptoms of fevers, chills, cough, or new SOB worrisome for COVID 19.  Past Medical History:  Diagnosis Date  . Arthritis   . CKD (chronic kidney disease) stage 3, GFR 30-59 ml/min (HCC)    CKD  . Coronary artery disease    s/p NSTEMI with multivessel CAD wit hoccluded RCA; myoview with inferior and inferoseptal infarct with minimal peri infarct ishemia - managed medically  . Diabetes mellitus   . Diabetes mellitus type 2 in obese (HCC)   . Dysrhythmia 03/2012   bradycardia  . Heart murmur   . History of echocardiogram    Echo 8/16:  Mild LVH, EF 50-55%, trivial AI, mild MR, severe LAE, mild RAE, PASP 37 mmHg  //  Echo 03/02/16: Mild concentric LVH, EF 50-55, normal wall motion, grade 2 diastolic dysfunction, mild MR,  moderate to severe LAE, PASP 35  . Hypertension   . LV dysfunction Jan 2014   EF is 45 to 50% per echo  . Myocardial infarction (HCC) 03/2012  . Neuromuscular disorder (HCC)    TREMORS  . Obese   . PAF (paroxysmal atrial fibrillation) [facility]) Jan 2014   Brief episode - no anticoagulation    Past Surgical History:  Procedure Laterality Date  . CARDIAC CATHETERIZATION  03/21/2012  . EP IMPLANTABLE DEVICE N/A 02/12/2016   Procedure: BiV Pacemaker Insertion CRT-P;  Surgeon: Marinus Maw, MD;  Location[facility] INVASIVE CV LAB;  Service: Cardiovascular;  Laterality: N/A;  . LEFT HEART CATHETERIZATION WITH CORONARY ANGIOGRAM N/A 03/21/2012   Procedure: LEFT HEART CATHETERIZATION WITH CORONARY ANGIOGRAM;  Surgeon: Peter M Swaziland, MD;  Location: [facility] CATH LAB;  Service: Cardiovascular;  Laterality: N/A;  . RIB FRACTURE SURGERY    . s/p knee surgery for torn ligament      Current Outpatient Medications  Medication Sig Dispense Refill  . amLODipine (NORVASC) 10 MG tablet Take 10 mg by mouth daily.     Marland Kitchen atorvastatin (LIPITOR) 80 MG tablet Take 1 tablet (80 mg total) by mouth daily at 6 PM. 30 tablet 6  . BAYER MICROLET LANCETS lancets Use as instructed to check blood sugar once daily dx code 250.42 100 each 1  . Dulaglutide 1.5 MG/0.5ML SOPN Inject 0.5 mLs into the skin once a week.    Marland Kitchen  furosemide (LASIX) 40 MG tablet Take 0.5 tablets (20 mg total) by mouth daily. 45 tablet 1  . glucose blood (ONETOUCH VERIO) test strip Use as instructed to check blood sugar once a day dx code E11.29 100 each 1  . insulin degludec (TRESIBA) 100 UNIT/ML SOPN FlexTouch Pen Inject 20 Units into the skin daily.    . isosorbide mononitrate (IMDUR) 60 MG 24 hr tablet Take 1 tablet (60 mg total) by mouth daily. 30 tablet 6  . losartan (COZAAR) 25 MG tablet Take 1 tablet (25 mg total) by mouth daily. NEED OV. 90 tablet 0  . Multiple Vitamin (MULTIVITAMIN WITH MINERALS) TABS Take 1 tablet by mouth daily.    [name] 20 MG  TABS tablet TAKE 1 TABLET BY MOUTH ONCE DAILY WITH SUPPER 90 tablet 1   No current facility-administered medications for this visit.     Allergies:   Metformin and related   Social History:  The patient  reports that he quit smoking about 45 years ago. His smoking use included cigarettes. He started smoking about 51 years ago. He has a 1.50 pack-year smoking history. He has never used smokeless tobacco. He reports that he does not drink alcohol or use drugs.   Family History:  The patient's  family history includes Heart disease in his father.   ROS:  Please see the history of present illness.   All other systems are personally reviewed and negative.    Exam:    Vital Signs:  CBG 117   Labs/Other Tests and Data Reviewed:    Recent Labs: No results found for requested labs within last 8760 hours.   Wt Readings from Last 3 Encounters:  04/22/17 257 lb (116.6 kg)  03/21/17 255 lb (115.7 kg)  09/23/16 236 lb 9.6 oz (107.3 kg)     Other studies personally reviewed:  Last device remote is reviewed from PaceART PDF dated 04/20/18 which reveals normal device function, no arrhythmias    ASSESSMENT & PLAN:    1.  CHB - he is s/p PPM insertion 2. PPM - his medtronic DDD PM was working normally at the time of his last check a month ago. 3. HTN - he states his BP is much better. I encouraged him to avoid salty foods. 4. COVID 19 screen The patient denies symptoms of COVID 19 at this time.  The importance of social distancing was discussed today.  Follow-up:  One year Next remote: 5/20  Current medicines are reviewed at length with the patient today.   The patient does not have concerns regarding his medicines.  The following changes were made today:  none  Labs/ tests ordered today include: none No orders of the defined types were placed in this encounter.    Patient Risk:  after full review of this patients clinical status, I feel that they are at moderate risk at this time.   Today, I have spent 15 minutes with the patient with telehealth technology discussing the above problems.    Signed, Lewayne Bunting, MD  05/30/2018 3:29 PM     [facility] [address] [place] [address] [phone] (office) [phone] (fax)

## 2018-07-14 ENCOUNTER — Other Ambulatory Visit: Payer: Self-pay | Admitting: Cardiology

## 2018-07-18 ENCOUNTER — Ambulatory Visit (INDEPENDENT_AMBULATORY_CARE_PROVIDER_SITE_OTHER): Payer: Medicare HMO | Admitting: *Deleted

## 2018-07-18 ENCOUNTER — Other Ambulatory Visit: Payer: Self-pay

## 2018-07-18 DIAGNOSIS — I442 Atrioventricular block, complete: Secondary | ICD-10-CM

## 2018-07-18 LAB — CUP PACEART REMOTE DEVICE CHECK
Battery Remaining Longevity: 82 mo
Battery Voltage: 2.99 V
Brady Statistic AP VP Percent: 14.1 %
Brady Statistic AP VS Percent: 0.01 %
Brady Statistic AS VP Percent: 85.82 %
Brady Statistic AS VS Percent: 0.07 %
Brady Statistic RA Percent Paced: 13.98 %
Brady Statistic RV Percent Paced: 99.92 %
Date Time Interrogation Session: 20200519173144
Implantable Lead Implant Date: 20171214
Implantable Lead Implant Date: 20171214
Implantable Lead Implant Date: 20171214
Implantable Lead Location: 753858
Implantable Lead Location: 753859
Implantable Lead Location: 753860
Implantable Lead Model: 4598
Implantable Lead Model: 5076
Implantable Lead Model: 5076
Implantable Pulse Generator Implant Date: 20171214
Lead Channel Impedance Value: 285 Ohm
Lead Channel Impedance Value: 323 Ohm
Lead Channel Impedance Value: 323 Ohm
Lead Channel Impedance Value: 323 Ohm
Lead Channel Impedance Value: 342 Ohm
Lead Channel Impedance Value: 342 Ohm
Lead Channel Impedance Value: 361 Ohm
Lead Channel Impedance Value: 418 Ohm
Lead Channel Impedance Value: 456 Ohm
Lead Channel Impedance Value: 494 Ohm
Lead Channel Impedance Value: 494 Ohm
Lead Channel Impedance Value: 532 Ohm
Lead Channel Impedance Value: 551 Ohm
Lead Channel Impedance Value: 570 Ohm
Lead Channel Pacing Threshold Amplitude: 0.5 V
Lead Channel Pacing Threshold Amplitude: 0.625 V
Lead Channel Pacing Threshold Amplitude: 1.375 V
Lead Channel Pacing Threshold Pulse Width: 0.4 ms
Lead Channel Pacing Threshold Pulse Width: 0.4 ms
Lead Channel Pacing Threshold Pulse Width: 0.4 ms
Lead Channel Sensing Intrinsic Amplitude: 16.25 mV
Lead Channel Sensing Intrinsic Amplitude: 16.25 mV
Lead Channel Sensing Intrinsic Amplitude: 2.625 mV
Lead Channel Sensing Intrinsic Amplitude: 2.625 mV
Lead Channel Setting Pacing Amplitude: 2 V
Lead Channel Setting Pacing Amplitude: 2 V
Lead Channel Setting Pacing Amplitude: 2.5 V
Lead Channel Setting Pacing Pulse Width: 0.4 ms
Lead Channel Setting Pacing Pulse Width: 0.4 ms
Lead Channel Setting Sensing Sensitivity: 2.8 mV

## 2018-07-27 ENCOUNTER — Encounter: Payer: Self-pay | Admitting: Cardiology

## 2018-07-27 NOTE — Progress Notes (Signed)
Remote pacemaker transmission.   

## 2018-10-17 ENCOUNTER — Ambulatory Visit (INDEPENDENT_AMBULATORY_CARE_PROVIDER_SITE_OTHER): Payer: Medicare HMO | Admitting: *Deleted

## 2018-10-17 DIAGNOSIS — I442 Atrioventricular block, complete: Secondary | ICD-10-CM | POA: Diagnosis not present

## 2018-10-17 DIAGNOSIS — I5042 Chronic combined systolic (congestive) and diastolic (congestive) heart failure: Secondary | ICD-10-CM

## 2018-10-17 LAB — CUP PACEART REMOTE DEVICE CHECK
Battery Remaining Longevity: 79 mo
Battery Voltage: 2.98 V
Brady Statistic AP VP Percent: 41.27 %
Brady Statistic AP VS Percent: 0.01 %
Brady Statistic AS VP Percent: 58.54 %
Brady Statistic AS VS Percent: 0.18 %
Brady Statistic RA Percent Paced: 41.09 %
Brady Statistic RV Percent Paced: 99.79 %
Date Time Interrogation Session: 20200818042807
Implantable Lead Implant Date: 20171214
Implantable Lead Implant Date: 20171214
Implantable Lead Implant Date: 20171214
Implantable Lead Location: 753858
Implantable Lead Location: 753859
Implantable Lead Location: 753860
Implantable Lead Model: 4598
Implantable Lead Model: 5076
Implantable Lead Model: 5076
Implantable Pulse Generator Implant Date: 20171214
Lead Channel Impedance Value: 285 Ohm
Lead Channel Impedance Value: 304 Ohm
Lead Channel Impedance Value: 304 Ohm
Lead Channel Impedance Value: 323 Ohm
Lead Channel Impedance Value: 323 Ohm
Lead Channel Impedance Value: 323 Ohm
Lead Channel Impedance Value: 342 Ohm
Lead Channel Impedance Value: 399 Ohm
Lead Channel Impedance Value: 437 Ohm
Lead Channel Impedance Value: 494 Ohm
Lead Channel Impedance Value: 494 Ohm
Lead Channel Impedance Value: 513 Ohm
Lead Channel Impedance Value: 532 Ohm
Lead Channel Impedance Value: 551 Ohm
Lead Channel Pacing Threshold Amplitude: 0.5 V
Lead Channel Pacing Threshold Amplitude: 0.75 V
Lead Channel Pacing Threshold Amplitude: 1.375 V
Lead Channel Pacing Threshold Pulse Width: 0.4 ms
Lead Channel Pacing Threshold Pulse Width: 0.4 ms
Lead Channel Pacing Threshold Pulse Width: 0.4 ms
Lead Channel Sensing Intrinsic Amplitude: 16.375 mV
Lead Channel Sensing Intrinsic Amplitude: 16.375 mV
Lead Channel Sensing Intrinsic Amplitude: 2.75 mV
Lead Channel Sensing Intrinsic Amplitude: 2.75 mV
Lead Channel Setting Pacing Amplitude: 2 V
Lead Channel Setting Pacing Amplitude: 2 V
Lead Channel Setting Pacing Amplitude: 2.5 V
Lead Channel Setting Pacing Pulse Width: 0.4 ms
Lead Channel Setting Pacing Pulse Width: 0.4 ms
Lead Channel Setting Sensing Sensitivity: 2.8 mV

## 2018-10-25 ENCOUNTER — Encounter: Payer: Self-pay | Admitting: Cardiology

## 2018-10-25 NOTE — Progress Notes (Signed)
Remote pacemaker transmission.   

## 2019-01-16 ENCOUNTER — Ambulatory Visit (INDEPENDENT_AMBULATORY_CARE_PROVIDER_SITE_OTHER): Payer: Medicare HMO | Admitting: *Deleted

## 2019-01-16 DIAGNOSIS — I441 Atrioventricular block, second degree: Secondary | ICD-10-CM | POA: Diagnosis not present

## 2019-01-16 DIAGNOSIS — I480 Paroxysmal atrial fibrillation: Secondary | ICD-10-CM

## 2019-01-16 LAB — CUP PACEART REMOTE DEVICE CHECK
Battery Remaining Longevity: 78 mo
Battery Voltage: 2.98 V
Brady Statistic AP VP Percent: 40.14 %
Brady Statistic AP VS Percent: 0.01 %
Brady Statistic AS VP Percent: 59.7 %
Brady Statistic AS VS Percent: 0.14 %
Brady Statistic RA Percent Paced: 39.87 %
Brady Statistic RV Percent Paced: 99.83 %
Date Time Interrogation Session: 20201117041829
Implantable Lead Implant Date: 20171214
Implantable Lead Implant Date: 20171214
Implantable Lead Implant Date: 20171214
Implantable Lead Location: 753858
Implantable Lead Location: 753859
Implantable Lead Location: 753860
Implantable Lead Model: 4598
Implantable Lead Model: 5076
Implantable Lead Model: 5076
Implantable Pulse Generator Implant Date: 20171214
Lead Channel Impedance Value: 285 Ohm
Lead Channel Impedance Value: 323 Ohm
Lead Channel Impedance Value: 323 Ohm
Lead Channel Impedance Value: 342 Ohm
Lead Channel Impedance Value: 342 Ohm
Lead Channel Impedance Value: 342 Ohm
Lead Channel Impedance Value: 361 Ohm
Lead Channel Impedance Value: 437 Ohm
Lead Channel Impedance Value: 437 Ohm
Lead Channel Impedance Value: 494 Ohm
Lead Channel Impedance Value: 513 Ohm
Lead Channel Impedance Value: 551 Ohm
Lead Channel Impedance Value: 570 Ohm
Lead Channel Impedance Value: 589 Ohm
Lead Channel Pacing Threshold Amplitude: 0.625 V
Lead Channel Pacing Threshold Amplitude: 0.75 V
Lead Channel Pacing Threshold Amplitude: 1.5 V
Lead Channel Pacing Threshold Pulse Width: 0.4 ms
Lead Channel Pacing Threshold Pulse Width: 0.4 ms
Lead Channel Pacing Threshold Pulse Width: 0.4 ms
Lead Channel Sensing Intrinsic Amplitude: 18.25 mV
Lead Channel Sensing Intrinsic Amplitude: 18.25 mV
Lead Channel Sensing Intrinsic Amplitude: 2.25 mV
Lead Channel Sensing Intrinsic Amplitude: 2.25 mV
Lead Channel Setting Pacing Amplitude: 2 V
Lead Channel Setting Pacing Amplitude: 2 V
Lead Channel Setting Pacing Amplitude: 2.5 V
Lead Channel Setting Pacing Pulse Width: 0.4 ms
Lead Channel Setting Pacing Pulse Width: 0.4 ms
Lead Channel Setting Sensing Sensitivity: 2.8 mV

## 2019-02-12 NOTE — Progress Notes (Signed)
Remote pacemaker transmission.   

## 2019-03-28 ENCOUNTER — Other Ambulatory Visit: Payer: Self-pay | Admitting: Internal Medicine

## 2019-04-17 ENCOUNTER — Ambulatory Visit (INDEPENDENT_AMBULATORY_CARE_PROVIDER_SITE_OTHER): Payer: Medicare HMO | Admitting: *Deleted

## 2019-04-17 DIAGNOSIS — I441 Atrioventricular block, second degree: Secondary | ICD-10-CM

## 2019-04-17 LAB — CUP PACEART REMOTE DEVICE CHECK
Battery Remaining Longevity: 79 mo
Battery Voltage: 2.98 V
Brady Statistic AP VP Percent: 29.11 %
Brady Statistic AP VS Percent: 0.01 %
Brady Statistic AS VP Percent: 70.71 %
Brady Statistic AS VS Percent: 0.17 %
Brady Statistic RA Percent Paced: 28.76 %
Brady Statistic RV Percent Paced: 99.8 %
Date Time Interrogation Session: 20210215232655
Implantable Lead Implant Date: 20171214
Implantable Lead Implant Date: 20171214
Implantable Lead Implant Date: 20171214
Implantable Lead Location: 753858
Implantable Lead Location: 753859
Implantable Lead Location: 753860
Implantable Lead Model: 4598
Implantable Lead Model: 5076
Implantable Lead Model: 5076
Implantable Pulse Generator Implant Date: 20171214
Lead Channel Impedance Value: 304 Ohm
Lead Channel Impedance Value: 323 Ohm
Lead Channel Impedance Value: 342 Ohm
Lead Channel Impedance Value: 342 Ohm
Lead Channel Impedance Value: 342 Ohm
Lead Channel Impedance Value: 361 Ohm
Lead Channel Impedance Value: 361 Ohm
Lead Channel Impedance Value: 418 Ohm
Lead Channel Impedance Value: 456 Ohm
Lead Channel Impedance Value: 532 Ohm
Lead Channel Impedance Value: 532 Ohm
Lead Channel Impedance Value: 551 Ohm
Lead Channel Impedance Value: 570 Ohm
Lead Channel Impedance Value: 589 Ohm
Lead Channel Pacing Threshold Amplitude: 0.5 V
Lead Channel Pacing Threshold Amplitude: 0.625 V
Lead Channel Pacing Threshold Amplitude: 1.125 V
Lead Channel Pacing Threshold Pulse Width: 0.4 ms
Lead Channel Pacing Threshold Pulse Width: 0.4 ms
Lead Channel Pacing Threshold Pulse Width: 0.4 ms
Lead Channel Sensing Intrinsic Amplitude: 17.25 mV
Lead Channel Sensing Intrinsic Amplitude: 17.25 mV
Lead Channel Sensing Intrinsic Amplitude: 2.125 mV
Lead Channel Sensing Intrinsic Amplitude: 2.125 mV
Lead Channel Setting Pacing Amplitude: 1.75 V
Lead Channel Setting Pacing Amplitude: 2 V
Lead Channel Setting Pacing Amplitude: 2.5 V
Lead Channel Setting Pacing Pulse Width: 0.4 ms
Lead Channel Setting Pacing Pulse Width: 0.4 ms
Lead Channel Setting Sensing Sensitivity: 2.8 mV

## 2019-04-18 NOTE — Progress Notes (Signed)
PPM Remote  

## 2019-05-16 ENCOUNTER — Other Ambulatory Visit: Payer: Self-pay | Admitting: Cardiology

## 2019-05-18 ENCOUNTER — Other Ambulatory Visit: Payer: Self-pay | Admitting: Internal Medicine

## 2019-05-21 ENCOUNTER — Ambulatory Visit: Payer: Medicare HMO | Admitting: Internal Medicine

## 2019-05-21 ENCOUNTER — Encounter (INDEPENDENT_AMBULATORY_CARE_PROVIDER_SITE_OTHER): Payer: Self-pay

## 2019-05-21 ENCOUNTER — Encounter: Payer: Self-pay | Admitting: Internal Medicine

## 2019-05-21 ENCOUNTER — Other Ambulatory Visit: Payer: Self-pay

## 2019-05-21 VITALS — BP 130/66 | HR 68 | Resp 15 | Ht 74.0 in | Wt 272.6 lb

## 2019-05-21 DIAGNOSIS — Z950 Presence of cardiac pacemaker: Secondary | ICD-10-CM | POA: Diagnosis not present

## 2019-05-21 DIAGNOSIS — I441 Atrioventricular block, second degree: Secondary | ICD-10-CM

## 2019-05-21 DIAGNOSIS — I10 Essential (primary) hypertension: Secondary | ICD-10-CM | POA: Diagnosis not present

## 2019-05-21 LAB — CUP PACEART INCLINIC DEVICE CHECK
Battery Remaining Longevity: 79 mo
Battery Voltage: 2.98 V
Brady Statistic AP VP Percent: 26.8 %
Brady Statistic AP VS Percent: 0.01 %
Brady Statistic AS VP Percent: 72.67 %
Brady Statistic AS VS Percent: 0.52 %
Brady Statistic RA Percent Paced: 26.88 %
Brady Statistic RV Percent Paced: 99.45 %
Date Time Interrogation Session: 20210322151400
Implantable Lead Implant Date: 20171214
Implantable Lead Implant Date: 20171214
Implantable Lead Implant Date: 20171214
Implantable Lead Location: 753858
Implantable Lead Location: 753859
Implantable Lead Location: 753860
Implantable Lead Model: 4598
Implantable Lead Model: 5076
Implantable Lead Model: 5076
Implantable Pulse Generator Implant Date: 20171214
Lead Channel Impedance Value: 285 Ohm
Lead Channel Impedance Value: 323 Ohm
Lead Channel Impedance Value: 342 Ohm
Lead Channel Impedance Value: 342 Ohm
Lead Channel Impedance Value: 361 Ohm
Lead Channel Impedance Value: 361 Ohm
Lead Channel Impedance Value: 361 Ohm
Lead Channel Impedance Value: 456 Ohm
Lead Channel Impedance Value: 475 Ohm
Lead Channel Impedance Value: 513 Ohm
Lead Channel Impedance Value: 513 Ohm
Lead Channel Impedance Value: 532 Ohm
Lead Channel Impedance Value: 570 Ohm
Lead Channel Impedance Value: 589 Ohm
Lead Channel Pacing Threshold Amplitude: 0.5 V
Lead Channel Pacing Threshold Amplitude: 0.75 V
Lead Channel Pacing Threshold Amplitude: 1.25 V
Lead Channel Pacing Threshold Pulse Width: 0.4 ms
Lead Channel Pacing Threshold Pulse Width: 0.4 ms
Lead Channel Pacing Threshold Pulse Width: 0.4 ms
Lead Channel Sensing Intrinsic Amplitude: 15.3 mV
Lead Channel Sensing Intrinsic Amplitude: 2.4 mV
Lead Channel Setting Pacing Amplitude: 1.75 V
Lead Channel Setting Pacing Amplitude: 2 V
Lead Channel Setting Pacing Amplitude: 2.5 V
Lead Channel Setting Pacing Pulse Width: 0.4 ms
Lead Channel Setting Pacing Pulse Width: 0.4 ms
Lead Channel Setting Sensing Sensitivity: 2.8 mV

## 2019-05-21 NOTE — Progress Notes (Signed)
HPI [name] returns today for followup. He is a pleasant 69 yo man with a h/o chronic systolic heart failure, s/p biv PPM insertion, CHB, who has had normalization of his LV function. He denies chest pain or sob. He is still working. No syncope. He has very mild swelling in his right leg. He has a remote h/o 2 football injuries to that knee with surgery. Allergies  Allergen Reactions  . Metformin And Related Other (See Comments)    Renal insufficiency     Current Outpatient Medications  Medication Sig Dispense Refill  . amLODipine (NORVASC) 10 MG tablet Take 10 mg by mouth daily.     Marland Kitchen atorvastatin (LIPITOR) 80 MG tablet Take 1 tablet (80 mg total) by mouth daily at 6 PM. 30 tablet 6  . BAYER MICROLET LANCETS lancets Use as instructed to check blood sugar once daily dx code 250.42 100 each 1  . Dulaglutide 1.5 MG/0.5ML SOPN Inject 0.5 mLs into the skin once a week.    . furosemide (LASIX) 40 MG tablet Take 1 tablet (40 mg total) by mouth daily. Please keep upcoming appt with Dr. Lovena Le in March for future refills. Thank you 30 tablet 0  . glucose blood (ONETOUCH VERIO) test strip Use as instructed to check blood sugar once a day dx code E11.29 100 each 1  . insulin degludec (TRESIBA) 100 UNIT/ML SOPN FlexTouch Pen Inject 20 Units into the skin daily.    . isosorbide mononitrate (IMDUR) 60 MG 24 hr tablet Take 1 tablet (60 mg total) by mouth daily. 30 tablet 6  . losartan (COZAAR) 25 MG tablet Take 1 tablet (25 mg total) by mouth daily. NEED OV. 90 tablet 0  . Multiple Vitamin (MULTIVITAMIN WITH MINERALS) TABS Take 1 tablet by mouth daily.    Alveda Reasons 20 MG TABS tablet TAKE 1 TABLET BY MOUTH ONCE DAILY WITH SUPPER 90 tablet 0   No current facility-administered medications for this visit.     Past Medical History:  Diagnosis Date  . Arthritis   . CKD (chronic kidney disease) stage 3, GFR 30-59 ml/min    CKD  . Coronary artery disease    s/p NSTEMI with multivessel CAD wit  hoccluded RCA; myoview with inferior and inferoseptal infarct with minimal peri infarct ishemia - managed medically  . Diabetes mellitus   . Diabetes mellitus type 2 in obese ([place])   . Dysrhythmia 03/2012   bradycardia  . Heart murmur   . History of echocardiogram    Echo 8/16:  Mild LVH, EF 50-55%, trivial AI, mild MR, severe LAE, mild RAE, PASP 37 mmHg  //  Echo 03/02/16: Mild concentric LVH, EF 50-55, normal wall motion, grade 2 diastolic dysfunction, mild MR, moderate to severe LAE, PASP 35  . Hypertension   . LV dysfunction Jan 2014   EF is 45 to 50% per echo  . Myocardial infarction ([place]) 03/2012  . Neuromuscular disorder ([place])    TREMORS  . Obese   . PAF (paroxysmal atrial fibrillation) ([place]) Jan 2014   Brief episode - no anticoagulation    ROS:   All systems reviewed and negative except as noted in the HPI.   Past Surgical History:  Procedure Laterality Date  . CARDIAC CATHETERIZATION  03/21/2012  . EP IMPLANTABLE DEVICE N/A 02/12/2016   Procedure: BiV Pacemaker Insertion CRT-P;  Surgeon: Evans Lance, MD;  Location: [facility];  Service: Cardiovascular;  Laterality: N/A;  . LEFT HEART  CATHETERIZATION WITH CORONARY ANGIOGRAM N/A 03/21/2012   Procedure: LEFT HEART CATHETERIZATION WITH CORONARY ANGIOGRAM;  Surgeon: Peter M Swaziland, MD;  Location: [facility] CATH LAB;  Service: Cardiovascular;  Laterality: N/A;  . RIB FRACTURE SURGERY    . s/p knee surgery for torn ligament       Family History  Problem Relation Age of Onset  . Heart disease Father      Social History   Socioeconomic History  . Marital status: Married    Spouse name: Not on file  . Number of children: Not on file  . Years of education: Not on file  . Highest education level: Not on file  Occupational History  . Not on file  Tobacco Use  . Smoking status: Former Smoker    Packs/day: 0.25    Years: 6.00    Pack years: 1.50    Types: Cigarettes    Start date: 06/30/1966    Quit date: 06/29/1972     Years since quitting: 46.9  . Smokeless tobacco: Never Used  Substance and Sexual Activity  . Alcohol use: No  . Drug use: No  . Sexual activity: Never  Other Topics Concern  . Not on file  Social History Narrative  . Not on file   Social Determinants of Health   Financial Resource Strain:   . Difficulty of Paying Living Expenses:   Food Insecurity:   . Worried About Programme researcher, broadcasting/film/video in the Last Year:   . Barista in the Last Year:   Transportation Needs:   . Freight forwarder (Medical):   Marland Kitchen Lack of Transportation (Non-Medical):   Physical Activity:   . Days of Exercise per Week:   . Minutes of Exercise per Session:   Stress:   . Feeling of Stress :   Social Connections:   . Frequency of Communication with Friends and Family:   . Frequency of Social Gatherings with Friends and Family:   . Attends Religious Services:   . Active Member of Clubs or Organizations:   . Attends Banker Meetings:   Marland Kitchen Marital Status:   Intimate Partner Violence:   . Fear of Current or Ex-Partner:   . Emotionally Abused:   Marland Kitchen Physically Abused:   . Sexually Abused:      BP 130/66   Pulse 68   Resp 15   Ht 6\' 2"  (1.88 m)   Wt 272 lb 9.6 oz (123.7 kg)   SpO2 98%   BMI 35.00 kg/m   Physical Exam:  Well appearing NAD HEENT: Unremarkable Neck:  No JVD, no thyromegally Lymphatics:  No adenopathy Back:  No CVA tenderness Lungs:  Clear with no wheezes HEART:  Regular rate rhythm, 1/6 murmur of MR, no rubs, no clicks Abd:  soft, positive bowel sounds, no organomegally, no rebound, no guarding Ext:  2 plus pulses, no edema, no cyanosis, no clubbing Skin:  No rashes no nodules Neuro:  CN II through XII intact, motor grossly intact  EKG - nsr with ventricular pacing  DEVICE  Normal device function.  See PaceArt for details.   Assess/Plan: 1. Chronic systolic heart failure - he has had normalziation of his LV function.  2. MR - review of his 2D echo from 3  years ago demonstrates mild MR. His exam has not changed.  3. HTN - his bp is well controlled.  4. PAF - he is maintaining NSR over 99.9% of the time.  .D.

## 2019-05-21 NOTE — Patient Instructions (Signed)
Medication Instructions:  Your physician recommends that you continue on your current medications as directed. Please refer to the Current Medication list given to you today.  Labwork: None ordered.  Testing/Procedures: None ordered.  Follow-Up: Your physician wants you to follow-up in: one year with Dr. Ladona Ridgel.   You will receive a reminder letter in the mail two months in advance. If you don't receive a letter, please call our office to schedule the follow-up appointment.  Remote monitoring is used to monitor your Pacemaker from home. This monitoring reduces the number of office visits required to check your device to one time per year. It allows [place] to keep an eye on the functioning of your device to ensure it is working properly. You are scheduled for a device check from home on 07/17/2019. You may send your transmission at any time that day. If you have a wireless device, the transmission will be sent automatically. After your physician reviews your transmission, you will receive a postcard with your next transmission date.  Any Other Special Instructions Will Be Listed Below (If Applicable).  If you need a refill on your cardiac medications before your next appointment, please call your pharmacy.

## 2019-07-17 ENCOUNTER — Ambulatory Visit (INDEPENDENT_AMBULATORY_CARE_PROVIDER_SITE_OTHER): Payer: Medicare HMO | Admitting: *Deleted

## 2019-07-17 DIAGNOSIS — I441 Atrioventricular block, second degree: Secondary | ICD-10-CM

## 2019-07-17 LAB — CUP PACEART REMOTE DEVICE CHECK
Battery Remaining Longevity: 76 mo
Battery Voltage: 2.97 V
Brady Statistic AP VP Percent: 12.85 %
Brady Statistic AP VS Percent: 0.01 %
Brady Statistic AS VP Percent: 86.87 %
Brady Statistic AS VS Percent: 0.27 %
Brady Statistic RA Percent Paced: 12.6 %
Brady Statistic RV Percent Paced: 99.7 %
Date Time Interrogation Session: 20210518002552
Implantable Lead Implant Date: 20171214
Implantable Lead Implant Date: 20171214
Implantable Lead Implant Date: 20171214
Implantable Lead Location: 753858
Implantable Lead Location: 753859
Implantable Lead Location: 753860
Implantable Lead Model: 4598
Implantable Lead Model: 5076
Implantable Lead Model: 5076
Implantable Pulse Generator Implant Date: 20171214
Lead Channel Impedance Value: 266 Ohm
Lead Channel Impedance Value: 304 Ohm
Lead Channel Impedance Value: 304 Ohm
Lead Channel Impedance Value: 342 Ohm
Lead Channel Impedance Value: 342 Ohm
Lead Channel Impedance Value: 342 Ohm
Lead Channel Impedance Value: 361 Ohm
Lead Channel Impedance Value: 418 Ohm
Lead Channel Impedance Value: 437 Ohm
Lead Channel Impedance Value: 475 Ohm
Lead Channel Impedance Value: 494 Ohm
Lead Channel Impedance Value: 532 Ohm
Lead Channel Impedance Value: 589 Ohm
Lead Channel Impedance Value: 589 Ohm
Lead Channel Pacing Threshold Amplitude: 0.5 V
Lead Channel Pacing Threshold Amplitude: 0.625 V
Lead Channel Pacing Threshold Amplitude: 1.125 V
Lead Channel Pacing Threshold Pulse Width: 0.4 ms
Lead Channel Pacing Threshold Pulse Width: 0.4 ms
Lead Channel Pacing Threshold Pulse Width: 0.4 ms
Lead Channel Sensing Intrinsic Amplitude: 1.625 mV
Lead Channel Sensing Intrinsic Amplitude: 1.625 mV
Lead Channel Sensing Intrinsic Amplitude: 18 mV
Lead Channel Sensing Intrinsic Amplitude: 18 mV
Lead Channel Setting Pacing Amplitude: 1.75 V
Lead Channel Setting Pacing Amplitude: 2 V
Lead Channel Setting Pacing Amplitude: 2.5 V
Lead Channel Setting Pacing Pulse Width: 0.4 ms
Lead Channel Setting Pacing Pulse Width: 0.4 ms
Lead Channel Setting Sensing Sensitivity: 2.8 mV

## 2019-07-18 NOTE — Progress Notes (Signed)
Remote pacemaker transmission.   

## 2019-07-27 ENCOUNTER — Other Ambulatory Visit: Payer: Self-pay | Admitting: Internal Medicine

## 2019-10-05 ENCOUNTER — Other Ambulatory Visit: Payer: Self-pay | Admitting: Cardiology

## 2019-10-05 NOTE — Telephone Encounter (Signed)
Prescription refill request for Xarelto received.  Indication:  Atrial Fibrillation Last office visit: 05/21/2019 [name] [name] Weight:123.7 kg Age:  69 Scr: 2.01 09/10/2019 CrCl: 59.83  Prescription refilled

## 2019-10-16 ENCOUNTER — Ambulatory Visit (INDEPENDENT_AMBULATORY_CARE_PROVIDER_SITE_OTHER): Payer: Medicare HMO | Admitting: *Deleted

## 2019-10-16 DIAGNOSIS — I441 Atrioventricular block, second degree: Secondary | ICD-10-CM

## 2019-10-16 LAB — CUP PACEART REMOTE DEVICE CHECK
Battery Remaining Longevity: 71 mo
Battery Voltage: 2.97 V
Brady Statistic AP VP Percent: 22.26 %
Brady Statistic AP VS Percent: 0.02 %
Brady Statistic AS VP Percent: 77.24 %
Brady Statistic AS VS Percent: 0.49 %
Brady Statistic RA Percent Paced: 21.89 %
Brady Statistic RV Percent Paced: 99.48 %
Date Time Interrogation Session: 20210817102610
Implantable Lead Implant Date: 20171214
Implantable Lead Implant Date: 20171214
Implantable Lead Implant Date: 20171214
Implantable Lead Location: 753858
Implantable Lead Location: 753859
Implantable Lead Location: 753860
Implantable Lead Model: 4598
Implantable Lead Model: 5076
Implantable Lead Model: 5076
Implantable Pulse Generator Implant Date: 20171214
Lead Channel Impedance Value: 266 Ohm
Lead Channel Impedance Value: 304 Ohm
Lead Channel Impedance Value: 323 Ohm
Lead Channel Impedance Value: 323 Ohm
Lead Channel Impedance Value: 323 Ohm
Lead Channel Impedance Value: 342 Ohm
Lead Channel Impedance Value: 342 Ohm
Lead Channel Impedance Value: 399 Ohm
Lead Channel Impedance Value: 418 Ohm
Lead Channel Impedance Value: 494 Ohm
Lead Channel Impedance Value: 513 Ohm
Lead Channel Impedance Value: 513 Ohm
Lead Channel Impedance Value: 570 Ohm
Lead Channel Impedance Value: 570 Ohm
Lead Channel Pacing Threshold Amplitude: 0.5 V
Lead Channel Pacing Threshold Amplitude: 0.625 V
Lead Channel Pacing Threshold Amplitude: 1.25 V
Lead Channel Pacing Threshold Pulse Width: 0.4 ms
Lead Channel Pacing Threshold Pulse Width: 0.4 ms
Lead Channel Pacing Threshold Pulse Width: 0.4 ms
Lead Channel Sensing Intrinsic Amplitude: 1.75 mV
Lead Channel Sensing Intrinsic Amplitude: 1.75 mV
Lead Channel Sensing Intrinsic Amplitude: 19 mV
Lead Channel Sensing Intrinsic Amplitude: 19 mV
Lead Channel Setting Pacing Amplitude: 1.75 V
Lead Channel Setting Pacing Amplitude: 2 V
Lead Channel Setting Pacing Amplitude: 2.5 V
Lead Channel Setting Pacing Pulse Width: 0.4 ms
Lead Channel Setting Pacing Pulse Width: 0.4 ms
Lead Channel Setting Sensing Sensitivity: 2.8 mV

## 2019-10-17 ENCOUNTER — Other Ambulatory Visit: Payer: Self-pay | Admitting: Internal Medicine

## 2019-10-18 NOTE — Progress Notes (Signed)
Remote pacemaker transmission.   

## 2020-01-15 ENCOUNTER — Ambulatory Visit (INDEPENDENT_AMBULATORY_CARE_PROVIDER_SITE_OTHER): Payer: Medicare HMO

## 2020-01-15 DIAGNOSIS — I441 Atrioventricular block, second degree: Secondary | ICD-10-CM | POA: Diagnosis not present

## 2020-01-16 LAB — CUP PACEART REMOTE DEVICE CHECK
Battery Remaining Longevity: 55 mo
Battery Voltage: 2.96 V
Brady Statistic AP VP Percent: 28.69 %
Brady Statistic AP VS Percent: 0.01 %
Brady Statistic AS VP Percent: 70.98 %
Brady Statistic AS VS Percent: 0.32 %
Brady Statistic RA Percent Paced: 28.29 %
Brady Statistic RV Percent Paced: 99.65 %
Date Time Interrogation Session: 20211116232849
Implantable Lead Implant Date: 20171214
Implantable Lead Implant Date: 20171214
Implantable Lead Implant Date: 20171214
Implantable Lead Location: 753858
Implantable Lead Location: 753859
Implantable Lead Location: 753860
Implantable Lead Model: 4598
Implantable Lead Model: 5076
Implantable Lead Model: 5076
Implantable Pulse Generator Implant Date: 20171214
Lead Channel Impedance Value: 266 Ohm
Lead Channel Impedance Value: 304 Ohm
Lead Channel Impedance Value: 304 Ohm
Lead Channel Impedance Value: 323 Ohm
Lead Channel Impedance Value: 323 Ohm
Lead Channel Impedance Value: 323 Ohm
Lead Channel Impedance Value: 361 Ohm
Lead Channel Impedance Value: 380 Ohm
Lead Channel Impedance Value: 399 Ohm
Lead Channel Impedance Value: 456 Ohm
Lead Channel Impedance Value: 475 Ohm
Lead Channel Impedance Value: 513 Ohm
Lead Channel Impedance Value: 551 Ohm
Lead Channel Impedance Value: 551 Ohm
Lead Channel Pacing Threshold Amplitude: 0.625 V
Lead Channel Pacing Threshold Amplitude: 0.625 V
Lead Channel Pacing Threshold Amplitude: 1.75 V
Lead Channel Pacing Threshold Pulse Width: 0.4 ms
Lead Channel Pacing Threshold Pulse Width: 0.4 ms
Lead Channel Pacing Threshold Pulse Width: 0.4 ms
Lead Channel Sensing Intrinsic Amplitude: 1.75 mV
Lead Channel Sensing Intrinsic Amplitude: 1.75 mV
Lead Channel Sensing Intrinsic Amplitude: 15.875 mV
Lead Channel Sensing Intrinsic Amplitude: 15.875 mV
Lead Channel Setting Pacing Amplitude: 2 V
Lead Channel Setting Pacing Amplitude: 2.5 V
Lead Channel Setting Pacing Amplitude: 2.75 V
Lead Channel Setting Pacing Pulse Width: 0.4 ms
Lead Channel Setting Pacing Pulse Width: 0.4 ms
Lead Channel Setting Sensing Sensitivity: 2.8 mV

## 2020-01-16 NOTE — Progress Notes (Signed)
Remote pacemaker transmission.   

## 2020-04-15 ENCOUNTER — Ambulatory Visit (INDEPENDENT_AMBULATORY_CARE_PROVIDER_SITE_OTHER): Payer: Medicare HMO

## 2020-04-15 DIAGNOSIS — I441 Atrioventricular block, second degree: Secondary | ICD-10-CM

## 2020-04-18 LAB — CUP PACEART REMOTE DEVICE CHECK
Battery Remaining Longevity: 59 mo
Battery Voltage: 2.96 V
Brady Statistic AP VP Percent: 27.71 %
Brady Statistic AP VS Percent: 0.01 %
Brady Statistic AS VP Percent: 72 %
Brady Statistic AS VS Percent: 0.28 %
Brady Statistic RA Percent Paced: 27.31 %
Brady Statistic RV Percent Paced: 99.7 %
Date Time Interrogation Session: 20220217232321
Implantable Lead Implant Date: 20171214
Implantable Lead Implant Date: 20171214
Implantable Lead Implant Date: 20171214
Implantable Lead Location: 753858
Implantable Lead Location: 753859
Implantable Lead Location: 753860
Implantable Lead Model: 4598
Implantable Lead Model: 5076
Implantable Lead Model: 5076
Implantable Pulse Generator Implant Date: 20171214
Lead Channel Impedance Value: 266 Ohm
Lead Channel Impedance Value: 304 Ohm
Lead Channel Impedance Value: 304 Ohm
Lead Channel Impedance Value: 304 Ohm
Lead Channel Impedance Value: 323 Ohm
Lead Channel Impedance Value: 323 Ohm
Lead Channel Impedance Value: 342 Ohm
Lead Channel Impedance Value: 361 Ohm
Lead Channel Impedance Value: 399 Ohm
Lead Channel Impedance Value: 475 Ohm
Lead Channel Impedance Value: 475 Ohm
Lead Channel Impedance Value: 494 Ohm
Lead Channel Impedance Value: 532 Ohm
Lead Channel Impedance Value: 532 Ohm
Lead Channel Pacing Threshold Amplitude: 0.5 V
Lead Channel Pacing Threshold Amplitude: 0.5 V
Lead Channel Pacing Threshold Amplitude: 1.5 V
Lead Channel Pacing Threshold Pulse Width: 0.4 ms
Lead Channel Pacing Threshold Pulse Width: 0.4 ms
Lead Channel Pacing Threshold Pulse Width: 0.4 ms
Lead Channel Sensing Intrinsic Amplitude: 1.625 mV
Lead Channel Sensing Intrinsic Amplitude: 1.625 mV
Lead Channel Sensing Intrinsic Amplitude: 15.75 mV
Lead Channel Sensing Intrinsic Amplitude: 15.75 mV
Lead Channel Setting Pacing Amplitude: 2 V
Lead Channel Setting Pacing Amplitude: 2 V
Lead Channel Setting Pacing Amplitude: 2.5 V
Lead Channel Setting Pacing Pulse Width: 0.4 ms
Lead Channel Setting Pacing Pulse Width: 0.4 ms
Lead Channel Setting Sensing Sensitivity: 2.8 mV

## 2020-04-21 NOTE — Progress Notes (Signed)
Remote pacemaker transmission.   

## 2020-06-03 ENCOUNTER — Ambulatory Visit (INDEPENDENT_AMBULATORY_CARE_PROVIDER_SITE_OTHER): Payer: Medicare HMO | Admitting: Internal Medicine

## 2020-06-03 ENCOUNTER — Encounter: Payer: Self-pay | Admitting: Internal Medicine

## 2020-06-03 ENCOUNTER — Other Ambulatory Visit: Payer: Self-pay

## 2020-06-03 VITALS — BP 160/68 | HR 66 | Ht 73.0 in | Wt 256.0 lb

## 2020-06-03 DIAGNOSIS — Z950 Presence of cardiac pacemaker: Secondary | ICD-10-CM | POA: Diagnosis not present

## 2020-06-03 DIAGNOSIS — R001 Bradycardia, unspecified: Secondary | ICD-10-CM

## 2020-06-03 DIAGNOSIS — I10 Essential (primary) hypertension: Secondary | ICD-10-CM | POA: Diagnosis not present

## 2020-06-03 MED ORDER — RIVAROXABAN 20 MG PO TABS: ORAL_TABLET | ORAL | 3 refills | Status: DC

## 2020-06-03 MED ORDER — FUROSEMIDE 40 MG PO TABS: 40.0000 mg | ORAL_TABLET | Freq: Every day | ORAL | 3 refills | Status: AC

## 2020-06-03 NOTE — Patient Instructions (Addendum)
Medication Instructions:  Your physician recommends that you continue on your current medications as directed. Please refer to the Current Medication list given to you today.  AVOID fast food restaurants.  AVOID salty foods.  Labwork: None ordered.  Testing/Procedures: None ordered.  Follow-Up: Your physician wants you to follow-up in: one year with Lewayne Bunting, MD or one of the following Advanced Practice Providers on your designated Care Team:    Gypsy Balsam, NP  Francis Dowse, PA-C  Casimiro Needle "Mardelle Matte" [place], [place]  Remote monitoring is used to monitor your Pacemaker from home. This monitoring reduces the number of office visits required to check your device to one time per year. It allows [place] to keep an eye on the functioning of your device to ensure it is working properly. You are scheduled for a device check from home on 07/15/2020. You may send your transmission at any time that day. If you have a wireless device, the transmission will be sent automatically. After your physician reviews your transmission, you will receive a postcard with your next transmission date.  Any Other Special Instructions Will Be Listed Below (If Applicable).  If you need a refill on your cardiac medications before your next appointment, please call your pharmacy.

## 2020-06-03 NOTE — Progress Notes (Signed)
HPI [name] returns today for followup. He is a pleasant 70 yo man with a h/o chronic systolic heart failure, s/p biv PPM insertion, CHB, who has had normalization of his LV function. He denies chest pain or sob. He is still working. No syncope. He has chronic knee pain. He has not had surgery. He is still working 2 jobs. He denies peripheral edema. He notes that his bp is well controlled at home and when he is not in the doctors office. Allergies  Allergen Reactions  . Metformin And Related Other (See Comments)    Renal insufficiency     Current Outpatient Medications  Medication Sig Dispense Refill  . amLODipine (NORVASC) 10 MG tablet Take 10 mg by mouth daily.     Marland Kitchen atorvastatin (LIPITOR) 80 MG tablet Take 1 tablet (80 mg total) by mouth daily at 6 PM. 30 tablet 6  . BAYER MICROLET LANCETS lancets Use as instructed to check blood sugar once daily dx code 250.42 100 each 1  . Dulaglutide 1.5 MG/0.5ML SOPN Inject 0.5 mLs into the skin once a week.    . ferrous sulfate 325 (65 FE) MG tablet Take 1 tablet by mouth daily.    . furosemide (LASIX) 40 MG tablet Take 1 tablet by mouth once daily 90 tablet 1  . glucose blood (ONETOUCH VERIO) test strip Use as instructed to check blood sugar once a day dx code E11.29 100 each 1  . insulin degludec (TRESIBA) 100 UNIT/ML SOPN FlexTouch Pen Inject 20 Units into the skin daily.    . isosorbide mononitrate (IMDUR) 60 MG 24 hr tablet Take 1 tablet (60 mg total) by mouth daily. 30 tablet 6  . losartan (COZAAR) 25 MG tablet Take 1 tablet (25 mg total) by mouth daily. NEED OV. 90 tablet 0  . Multiple Vitamin (MULTIVITAMIN WITH MINERALS) TABS Take 1 tablet by mouth daily.    [name] 20 MG TABS tablet TAKE 1 TABLET BY MOUTH ONCE DAILY WITH SUPPER **PLEASE SCHEDULE AN APPT FOR FUTURE REFILLS** 90 tablet 1   No current facility-administered medications for this visit.     Past Medical History:  Diagnosis Date  . Arthritis   . CKD (chronic  kidney disease) stage 3, GFR 30-59 ml/min ([facility])    CKD  . Coronary artery disease    s/p NSTEMI with multivessel CAD wit hoccluded RCA; myoview with inferior and inferoseptal infarct with minimal peri infarct ishemia - managed medically  . Diabetes mellitus   . Diabetes mellitus type 2 in obese ([facility])   . Dysrhythmia 03/2012   bradycardia  . Heart murmur   . History of echocardiogram    Echo 8/16:  Mild LVH, EF 50-55%, trivial AI, mild MR, severe LAE, mild RAE, PASP 37 mmHg  //  Echo 03/02/16: Mild concentric LVH, EF 50-55, normal wall motion, grade 2 diastolic dysfunction, mild MR, moderate to severe LAE, PASP 35  . Hypertension   . LV dysfunction Jan 2014   EF is 45 to 50% per echo  . Myocardial infarction ([facility]) 03/2012  . Neuromuscular disorder ([facility])    TREMORS  . Obese   . PAF (paroxysmal atrial fibrillation) ([facility]) Jan 2014   Brief episode - no anticoagulation    ROS:   All systems reviewed and negative except as noted in the HPI.   Past Surgical History:  Procedure Laterality Date  . CARDIAC CATHETERIZATION  03/21/2012  . EP IMPLANTABLE DEVICE N/A 02/12/2016   Procedure:  BiV Pacemaker Insertion CRT-P;  Surgeon: Marinus Maw, MD;  Location: [facility] INVASIVE CV LAB;  Service: Cardiovascular;  Laterality: N/A;  . LEFT HEART CATHETERIZATION WITH CORONARY ANGIOGRAM N/A 03/21/2012   Procedure: LEFT HEART CATHETERIZATION WITH CORONARY ANGIOGRAM;  Surgeon: Peter M Swaziland, MD;  Location: [facility] CATH LAB;  Service: Cardiovascular;  Laterality: N/A;  . RIB FRACTURE SURGERY    . s/p knee surgery for torn ligament       Family History  Problem Relation Age of Onset  . Heart disease Father      Social History   Socioeconomic History  . Marital status: Married    Spouse name: Not on file  . Number of children: Not on file  . Years of education: Not on file  . Highest education level: Not on file  Occupational History  . Not on file  Tobacco Use  . Smoking status: Former Smoker     Packs/day: 0.25    Years: 6.00    Pack years: 1.50    Types: Cigarettes    Start date: 06/30/1966    Quit date: 06/29/1972    Years since quitting: 47.9  . Smokeless tobacco: Never Used  Vaping Use  . Vaping Use: Never used  Substance and Sexual Activity  . Alcohol use: No  . Drug use: No  . Sexual activity: Never  Other Topics Concern  . Not on file  Social History Narrative  . Not on file   Social Determinants of Health   Financial Resource Strain: Not on file  Food Insecurity: Not on file  Transportation Needs: Not on file  Physical Activity: Not on file  Stress: Not on file  Social Connections: Not on file  Intimate Partner Violence: Not on file     BP (!) 160/68   Pulse 66   Ht 6\' 1"  (1.854 m)   Wt 256 lb (116.1 kg)   SpO2 97%   BMI 33.78 kg/m   Physical Exam:  Well appearing NAD HEENT: Unremarkable Neck:  No JVD, no thyromegally Lymphatics:  No adenopathy Back:  No CVA tenderness Lungs:  Clear with no wheezes HEART:  Regular rate rhythm, no murmurs, no rubs, no clicks Abd:  soft, positive bowel sounds, no organomegally, no rebound, no guarding Ext:  2 plus pulses, no edema, no cyanosis, no clubbing Skin:  No rashes no nodules Neuro:  CN II through XII intact, motor grossly intact  EKG - NSR  DEVICE  Normal device function.  See PaceArt for details.   Assess/Plan: 1. Chronic systolic heart failure - he has had normalziation of his LV function. He is asymptomatic from a CHF perspective. He is limited by arthritis. 2. MR - review of his 2D echo from 3 years ago demonstrates mild MR. His exam has not changed.  3. HTN - his bp is elevated in the office but better at home.  4. PAF - he is maintaining NSR over 99.9% of the time.  .D.

## 2020-07-15 ENCOUNTER — Ambulatory Visit (INDEPENDENT_AMBULATORY_CARE_PROVIDER_SITE_OTHER): Payer: Medicare HMO

## 2020-07-15 DIAGNOSIS — I255 Ischemic cardiomyopathy: Secondary | ICD-10-CM

## 2020-07-15 LAB — CUP PACEART REMOTE DEVICE CHECK
Battery Remaining Longevity: 48 mo
Battery Voltage: 2.95 V
Brady Statistic AP VP Percent: 14.79 %
Brady Statistic AP VS Percent: 0.01 %
Brady Statistic AS VP Percent: 84.9 %
Brady Statistic AS VS Percent: 0.3 %
Brady Statistic RA Percent Paced: 14.56 %
Brady Statistic RV Percent Paced: 99.69 %
Date Time Interrogation Session: 20220517003533
Implantable Lead Implant Date: 20171214
Implantable Lead Implant Date: 20171214
Implantable Lead Implant Date: 20171214
Implantable Lead Location: 753858
Implantable Lead Location: 753859
Implantable Lead Location: 753860
Implantable Lead Model: 4598
Implantable Lead Model: 5076
Implantable Lead Model: 5076
Implantable Pulse Generator Implant Date: 20171214
Lead Channel Impedance Value: 266 Ohm
Lead Channel Impedance Value: 285 Ohm
Lead Channel Impedance Value: 304 Ohm
Lead Channel Impedance Value: 323 Ohm
Lead Channel Impedance Value: 323 Ohm
Lead Channel Impedance Value: 342 Ohm
Lead Channel Impedance Value: 361 Ohm
Lead Channel Impedance Value: 361 Ohm
Lead Channel Impedance Value: 399 Ohm
Lead Channel Impedance Value: 475 Ohm
Lead Channel Impedance Value: 494 Ohm
Lead Channel Impedance Value: 532 Ohm
Lead Channel Impedance Value: 570 Ohm
Lead Channel Impedance Value: 589 Ohm
Lead Channel Pacing Threshold Amplitude: 0.625 V
Lead Channel Pacing Threshold Amplitude: 0.625 V
Lead Channel Pacing Threshold Amplitude: 1.625 V
Lead Channel Pacing Threshold Pulse Width: 0.4 ms
Lead Channel Pacing Threshold Pulse Width: 0.4 ms
Lead Channel Pacing Threshold Pulse Width: 0.4 ms
Lead Channel Sensing Intrinsic Amplitude: 1.875 mV
Lead Channel Sensing Intrinsic Amplitude: 1.875 mV
Lead Channel Sensing Intrinsic Amplitude: 16.875 mV
Lead Channel Sensing Intrinsic Amplitude: 16.875 mV
Lead Channel Setting Pacing Amplitude: 2 V
Lead Channel Setting Pacing Amplitude: 2.5 V
Lead Channel Setting Pacing Amplitude: 2.5 V
Lead Channel Setting Pacing Pulse Width: 0.4 ms
Lead Channel Setting Pacing Pulse Width: 0.4 ms
Lead Channel Setting Sensing Sensitivity: 2.8 mV

## 2020-08-07 NOTE — Progress Notes (Signed)
Remote pacemaker transmission.   

## 2020-10-14 ENCOUNTER — Ambulatory Visit (INDEPENDENT_AMBULATORY_CARE_PROVIDER_SITE_OTHER): Payer: Medicare HMO

## 2020-10-14 DIAGNOSIS — I255 Ischemic cardiomyopathy: Secondary | ICD-10-CM

## 2020-10-14 LAB — CUP PACEART REMOTE DEVICE CHECK
Battery Remaining Longevity: 40 mo
Battery Voltage: 2.94 V
Brady Statistic AP VP Percent: 21.96 %
Brady Statistic AP VS Percent: 0.01 %
Brady Statistic AS VP Percent: 77.74 %
Brady Statistic AS VS Percent: 0.3 %
Brady Statistic RA Percent Paced: 21.68 %
Brady Statistic RV Percent Paced: 99.68 %
Date Time Interrogation Session: 20220816002825
Implantable Lead Implant Date: 20171214
Implantable Lead Implant Date: 20171214
Implantable Lead Implant Date: 20171214
Implantable Lead Location: 753858
Implantable Lead Location: 753859
Implantable Lead Location: 753860
Implantable Lead Model: 4598
Implantable Lead Model: 5076
Implantable Lead Model: 5076
Implantable Pulse Generator Implant Date: 20171214
Lead Channel Impedance Value: 266 Ohm
Lead Channel Impedance Value: 285 Ohm
Lead Channel Impedance Value: 304 Ohm
Lead Channel Impedance Value: 304 Ohm
Lead Channel Impedance Value: 304 Ohm
Lead Channel Impedance Value: 323 Ohm
Lead Channel Impedance Value: 323 Ohm
Lead Channel Impedance Value: 361 Ohm
Lead Channel Impedance Value: 399 Ohm
Lead Channel Impedance Value: 475 Ohm
Lead Channel Impedance Value: 475 Ohm
Lead Channel Impedance Value: 494 Ohm
Lead Channel Impedance Value: 513 Ohm
Lead Channel Impedance Value: 532 Ohm
Lead Channel Pacing Threshold Amplitude: 0.625 V
Lead Channel Pacing Threshold Amplitude: 0.625 V
Lead Channel Pacing Threshold Amplitude: 1.875 V
Lead Channel Pacing Threshold Pulse Width: 0.4 ms
Lead Channel Pacing Threshold Pulse Width: 0.4 ms
Lead Channel Pacing Threshold Pulse Width: 0.4 ms
Lead Channel Sensing Intrinsic Amplitude: 1.75 mV
Lead Channel Sensing Intrinsic Amplitude: 1.75 mV
Lead Channel Sensing Intrinsic Amplitude: 17.75 mV
Lead Channel Sensing Intrinsic Amplitude: 17.75 mV
Lead Channel Setting Pacing Amplitude: 2 V
Lead Channel Setting Pacing Amplitude: 2.5 V
Lead Channel Setting Pacing Amplitude: 2.5 V
Lead Channel Setting Pacing Pulse Width: 0.4 ms
Lead Channel Setting Pacing Pulse Width: 0.4 ms
Lead Channel Setting Sensing Sensitivity: 2.8 mV

## 2020-11-02 NOTE — Progress Notes (Signed)
Remote pacemaker transmission.   

## 2021-01-13 ENCOUNTER — Ambulatory Visit (INDEPENDENT_AMBULATORY_CARE_PROVIDER_SITE_OTHER): Payer: Medicare HMO

## 2021-01-13 DIAGNOSIS — I255 Ischemic cardiomyopathy: Secondary | ICD-10-CM | POA: Diagnosis not present

## 2021-01-13 LAB — CUP PACEART REMOTE DEVICE CHECK
Battery Remaining Longevity: 38 mo
Battery Voltage: 2.93 V
Brady Statistic AP VP Percent: 23.82 %
Brady Statistic AP VS Percent: 0.02 %
Brady Statistic AS VP Percent: 75.71 %
Brady Statistic AS VS Percent: 0.46 %
Brady Statistic RA Percent Paced: 23.53 %
Brady Statistic RV Percent Paced: 99.52 %
Date Time Interrogation Session: 20221114232334
Implantable Lead Implant Date: 20171214
Implantable Lead Implant Date: 20171214
Implantable Lead Implant Date: 20171214
Implantable Lead Location: 753858
Implantable Lead Location: 753859
Implantable Lead Location: 753860
Implantable Lead Model: 4598
Implantable Lead Model: 5076
Implantable Lead Model: 5076
Implantable Pulse Generator Implant Date: 20171214
Lead Channel Impedance Value: 266 Ohm
Lead Channel Impedance Value: 285 Ohm
Lead Channel Impedance Value: 304 Ohm
Lead Channel Impedance Value: 304 Ohm
Lead Channel Impedance Value: 323 Ohm
Lead Channel Impedance Value: 323 Ohm
Lead Channel Impedance Value: 323 Ohm
Lead Channel Impedance Value: 361 Ohm
Lead Channel Impedance Value: 399 Ohm
Lead Channel Impedance Value: 475 Ohm
Lead Channel Impedance Value: 494 Ohm
Lead Channel Impedance Value: 494 Ohm
Lead Channel Impedance Value: 532 Ohm
Lead Channel Impedance Value: 532 Ohm
Lead Channel Pacing Threshold Amplitude: 0.5 V
Lead Channel Pacing Threshold Amplitude: 0.5 V
Lead Channel Pacing Threshold Amplitude: 1.625 V
Lead Channel Pacing Threshold Pulse Width: 0.4 ms
Lead Channel Pacing Threshold Pulse Width: 0.4 ms
Lead Channel Pacing Threshold Pulse Width: 0.4 ms
Lead Channel Sensing Intrinsic Amplitude: 1.5 mV
Lead Channel Sensing Intrinsic Amplitude: 1.5 mV
Lead Channel Sensing Intrinsic Amplitude: 19.625 mV
Lead Channel Sensing Intrinsic Amplitude: 19.625 mV
Lead Channel Setting Pacing Amplitude: 2 V
Lead Channel Setting Pacing Amplitude: 2.25 V
Lead Channel Setting Pacing Amplitude: 2.5 V
Lead Channel Setting Pacing Pulse Width: 0.4 ms
Lead Channel Setting Pacing Pulse Width: 0.4 ms
Lead Channel Setting Sensing Sensitivity: 2.8 mV

## 2021-01-21 NOTE — Progress Notes (Signed)
Remote pacemaker transmission.   

## 2021-04-14 ENCOUNTER — Ambulatory Visit (INDEPENDENT_AMBULATORY_CARE_PROVIDER_SITE_OTHER): Payer: Medicare HMO

## 2021-04-14 DIAGNOSIS — I255 Ischemic cardiomyopathy: Secondary | ICD-10-CM

## 2021-04-14 LAB — CUP PACEART REMOTE DEVICE CHECK
Battery Remaining Longevity: 31 mo
Battery Voltage: 2.92 V
Brady Statistic AP VP Percent: 27.46 %
Brady Statistic AP VS Percent: 0.01 %
Brady Statistic AS VP Percent: 72.41 %
Brady Statistic AS VS Percent: 0.12 %
Brady Statistic RA Percent Paced: 26.94 %
Brady Statistic RV Percent Paced: 99.87 %
Date Time Interrogation Session: 20230213232703
Implantable Lead Implant Date: 20171214
Implantable Lead Implant Date: 20171214
Implantable Lead Implant Date: 20171214
Implantable Lead Location: 753858
Implantable Lead Location: 753859
Implantable Lead Location: 753860
Implantable Lead Model: 4598
Implantable Lead Model: 5076
Implantable Lead Model: 5076
Implantable Pulse Generator Implant Date: 20171214
Lead Channel Impedance Value: 266 Ohm
Lead Channel Impedance Value: 304 Ohm
Lead Channel Impedance Value: 304 Ohm
Lead Channel Impedance Value: 304 Ohm
Lead Channel Impedance Value: 323 Ohm
Lead Channel Impedance Value: 323 Ohm
Lead Channel Impedance Value: 323 Ohm
Lead Channel Impedance Value: 361 Ohm
Lead Channel Impedance Value: 399 Ohm
Lead Channel Impedance Value: 475 Ohm
Lead Channel Impedance Value: 475 Ohm
Lead Channel Impedance Value: 494 Ohm
Lead Channel Impedance Value: 532 Ohm
Lead Channel Impedance Value: 532 Ohm
Lead Channel Pacing Threshold Amplitude: 0.625 V
Lead Channel Pacing Threshold Amplitude: 0.625 V
Lead Channel Pacing Threshold Amplitude: 2 V
Lead Channel Pacing Threshold Pulse Width: 0.4 ms
Lead Channel Pacing Threshold Pulse Width: 0.4 ms
Lead Channel Pacing Threshold Pulse Width: 0.4 ms
Lead Channel Sensing Intrinsic Amplitude: 1.125 mV
Lead Channel Sensing Intrinsic Amplitude: 1.125 mV
Lead Channel Sensing Intrinsic Amplitude: 16.5 mV
Lead Channel Sensing Intrinsic Amplitude: 16.5 mV
Lead Channel Setting Pacing Amplitude: 2 V
Lead Channel Setting Pacing Amplitude: 2.5 V
Lead Channel Setting Pacing Amplitude: 2.5 V
Lead Channel Setting Pacing Pulse Width: 0.4 ms
Lead Channel Setting Pacing Pulse Width: 0.4 ms
Lead Channel Setting Sensing Sensitivity: 2.8 mV

## 2021-04-17 NOTE — Progress Notes (Signed)
Remote pacemaker transmission.   

## 2021-07-14 ENCOUNTER — Ambulatory Visit (INDEPENDENT_AMBULATORY_CARE_PROVIDER_SITE_OTHER): Payer: Medicare HMO

## 2021-07-14 DIAGNOSIS — I255 Ischemic cardiomyopathy: Secondary | ICD-10-CM

## 2021-07-17 LAB — CUP PACEART REMOTE DEVICE CHECK
Battery Remaining Longevity: 26 mo
Battery Voltage: 2.91 V
Brady Statistic AP VP Percent: 23.39 %
Brady Statistic AP VS Percent: 0.01 %
Brady Statistic AS VP Percent: 76.44 %
Brady Statistic AS VS Percent: 0.15 %
Brady Statistic RA Percent Paced: 23.15 %
Brady Statistic RV Percent Paced: 99.83 %
Date Time Interrogation Session: 20230517095832
Implantable Lead Implant Date: 20171214
Implantable Lead Implant Date: 20171214
Implantable Lead Implant Date: 20171214
Implantable Lead Location: 753858
Implantable Lead Location: 753859
Implantable Lead Location: 753860
Implantable Lead Model: 4598
Implantable Lead Model: 5076
Implantable Lead Model: 5076
Implantable Pulse Generator Implant Date: 20171214
Lead Channel Impedance Value: 285 Ohm
Lead Channel Impedance Value: 304 Ohm
Lead Channel Impedance Value: 304 Ohm
Lead Channel Impedance Value: 323 Ohm
Lead Channel Impedance Value: 323 Ohm
Lead Channel Impedance Value: 342 Ohm
Lead Channel Impedance Value: 342 Ohm
Lead Channel Impedance Value: 361 Ohm
Lead Channel Impedance Value: 399 Ohm
Lead Channel Impedance Value: 475 Ohm
Lead Channel Impedance Value: 475 Ohm
Lead Channel Impedance Value: 513 Ohm
Lead Channel Impedance Value: 551 Ohm
Lead Channel Impedance Value: 551 Ohm
Lead Channel Pacing Threshold Amplitude: 0.625 V
Lead Channel Pacing Threshold Amplitude: 0.625 V
Lead Channel Pacing Threshold Amplitude: 1.875 V
Lead Channel Pacing Threshold Pulse Width: 0.4 ms
Lead Channel Pacing Threshold Pulse Width: 0.4 ms
Lead Channel Pacing Threshold Pulse Width: 0.4 ms
Lead Channel Sensing Intrinsic Amplitude: 1.125 mV
Lead Channel Sensing Intrinsic Amplitude: 1.125 mV
Lead Channel Sensing Intrinsic Amplitude: 19.75 mV
Lead Channel Sensing Intrinsic Amplitude: 19.75 mV
Lead Channel Setting Pacing Amplitude: 2 V
Lead Channel Setting Pacing Amplitude: 2.5 V
Lead Channel Setting Pacing Amplitude: 2.5 V
Lead Channel Setting Pacing Pulse Width: 0.4 ms
Lead Channel Setting Pacing Pulse Width: 0.4 ms
Lead Channel Setting Sensing Sensitivity: 2.8 mV

## 2021-07-17 NOTE — Progress Notes (Signed)
Electrophysiology Office Note Date: 07/23/2021  ID:  [name] [name], DOB 1950-12-13, MRN [id number]  PCP: Maudie Flakes, FNP Primary Cardiologist: Peter Swaziland, MD Electrophysiologist: Lewayne Bunting, MD  CC: Pacemaker follow-up  [name] [name] is a 71 y.o. male seen today for Lewayne Bunting, MD for routine electrophysiology followup.  Since last being seen in our clinic the patient reports doing very well.  he denies chest pain, palpitations, dyspnea, PND, orthopnea, nausea, vomiting, dizziness, syncope, edema, weight gain, or early satiety.  Device History: Medtronic BiV PPM implanted 01/2016 for CHF and 2:1 AV block  Past Medical History:  Diagnosis Date   Arthritis    CKD (chronic kidney disease) stage 3, GFR 30-59 ml/min ([facility])    CKD   Coronary artery disease    s/p NSTEMI with multivessel CAD wit hoccluded RCA; myoview with inferior and inferoseptal infarct with minimal peri infarct ishemia - managed medically   Diabetes mellitus    Diabetes mellitus type 2 in obese [facility])    Dysrhythmia 03/2012   bradycardia   Heart murmur    History of echocardiogram    Echo 8/16:  Mild LVH, EF 50-55%, trivial AI, mild MR, severe LAE, mild RAE, PASP 37 mmHg  //  Echo 03/02/16: Mild concentric LVH, EF 50-55, normal wall motion, grade 2 diastolic dysfunction, mild MR, moderate to severe LAE, PASP 35   Hypertension    LV dysfunction Jan 2014   EF is 45 to 50% per echo   Myocardial infarction ([facility]) 03/2012   Neuromuscular disorder ([facility])    TREMORS   Obese    PAF (paroxysmal atrial fibrillation) ([facility]) Jan 2014   Brief episode - no anticoagulation   Past Surgical History:  Procedure Laterality Date   CARDIAC CATHETERIZATION  03/21/2012   EP IMPLANTABLE DEVICE N/A 02/12/2016   Procedure: BiV Pacemaker Insertion CRT-P;  Surgeon: Marinus Maw, MD;  Location: [facility] INVASIVE CV LAB;  Service: Cardiovascular;  Laterality: N/A;   LEFT HEART CATHETERIZATION WITH CORONARY ANGIOGRAM N/A 03/21/2012    Procedure: LEFT HEART CATHETERIZATION WITH CORONARY ANGIOGRAM;  Surgeon: Peter M Swaziland, MD;  Location: [facility] CATH LAB;  Service: Cardiovascular;  Laterality: N/A;   RIB FRACTURE SURGERY     s/p knee surgery for torn ligament      Current Outpatient Medications  Medication Sig Dispense Refill   amLODipine (NORVASC) 10 MG tablet Take 10 mg by mouth daily.      atorvastatin (LIPITOR) 80 MG tablet Take 1 tablet (80 mg total) by mouth daily at 6 PM. 30 tablet 6   BAYER MICROLET LANCETS lancets Use as instructed to check blood sugar once daily dx code 250.42 100 each 1   ferrous sulfate 325 (65 FE) MG tablet Take 1 tablet by mouth daily.     furosemide (LASIX) 40 MG tablet Take 1 tablet (40 mg total) by mouth daily. 90 tablet 3   glucose blood (ONETOUCH VERIO) test strip Use as instructed to check blood sugar once a day dx code E11.29 100 each 1   insulin degludec (TRESIBA) 100 UNIT/ML SOPN FlexTouch Pen Inject 20 Units into the skin daily.     isosorbide mononitrate (IMDUR) 60 MG 24 hr tablet Take 1 tablet (60 mg total) by mouth daily. 30 tablet 6   losartan (COZAAR) 100 MG tablet Take 100 mg by mouth daily.     Multiple Vitamin (MULTIVITAMIN WITH MINERALS) TABS Take 1 tablet by mouth daily.     rivaroxaban (XARELTO) 20 MG TABS tablet  TAKE 1 TABLET BY MOUTH ONCE DAILY WITH SUPPER 90 tablet 3   No current facility-administered medications for this visit.    Allergies:   Metformin and related   Social History: Social History   Socioeconomic History   Marital status: Married    Spouse name: Not on file   Number of children: Not on file   Years of education: Not on file   Highest education level: Not on file  Occupational History   Not on file  Tobacco Use   Smoking status: Former    Packs/day: 0.25    Years: 6.00    Pack years: 1.50    Types: Cigarettes    Start date: 06/30/1966    Quit date: 06/29/1972    Years since quitting: 49.0   Smokeless tobacco: Never  Vaping Use   Vaping Use:  Never used  Substance and Sexual Activity   Alcohol use: No   Drug use: No   Sexual activity: Never  Other Topics Concern   Not on file  Social History Narrative   Not on file   Social Determinants of Health   Financial Resource Strain: Not on file  Food Insecurity: Not on file  Transportation Needs: Not on file  Physical Activity: Not on file  Stress: Not on file  Social Connections: Not on file  Intimate Partner Violence: Not on file    Family History: Family History  Problem Relation Age of Onset   Heart disease Father      Review of Systems: All other systems reviewed and are otherwise negative except as noted above.  Physical Exam: Vitals:   07/23/21 0844  BP: 134/60  Pulse: 60  SpO2: 100%  Weight: 254 lb (115.2 kg)  Height: 6' (1.829 m)     GEN- The patient is well appearing, alert and oriented x 3 today.   HEENT: normocephalic, atraumatic; sclera clear, conjunctiva pink; hearing intact; oropharynx clear; neck supple  Lungs- Clear to ausculation bilaterally, normal work of breathing.  No wheezes, rales, rhonchi Heart- Regular rate and rhythm, no murmurs, rubs or gallops  GI- soft, non-tender, non-distended, bowel sounds present  Extremities- no clubbing or cyanosis. No edema MS- no significant deformity or atrophy Skin- warm and dry, no rash or lesion; PPM pocket well healed Psych- euthymic mood, full affect Neuro- strength and sensation are intact  PPM Interrogation- reviewed in detail today,  See PACEART report  EKG:  EKG is ordered today. Personal review of ekg ordered today shows VP at 60   Recent Labs: No results found for requested labs within last 8760 hours.   Wt Readings from Last 3 Encounters:  07/23/21 254 lb (115.2 kg)  06/03/20 256 lb (116.1 kg)  05/21/19 272 lb 9.6 oz (123.7 kg)     Other studies Reviewed: Additional studies/ records that were reviewed today include: Previous EP office notes, Previous remote checks, Most recent  labwork.   Assessment and Plan:  1. Second Degree AV block s/p Medtronic PPM  Normal PPM function See Pace Art report No changes today  2. CHF EF normalized after CRT Will request lab from nephrology  3. HTN Stable on current regimen   4. PAF Burden <1% by device Continue Xarelto  Current medicines are reviewed at length with the patient today.     Disposition:   Follow up with Dr. Ladona Ridgel in 12 months    Signed, Luane School  07/23/2021 8:49 AM  [facility] [address]  [place] [address] [phone] (office) [phone] (fax)

## 2021-07-23 ENCOUNTER — Encounter: Payer: Self-pay | Admitting: Student

## 2021-07-23 ENCOUNTER — Ambulatory Visit: Payer: Medicare HMO | Admitting: Student

## 2021-07-23 VITALS — BP 134/60 | HR 60 | Ht 72.0 in | Wt 254.0 lb

## 2021-07-23 DIAGNOSIS — Z950 Presence of cardiac pacemaker: Secondary | ICD-10-CM | POA: Diagnosis not present

## 2021-07-23 DIAGNOSIS — I10 Essential (primary) hypertension: Secondary | ICD-10-CM | POA: Diagnosis not present

## 2021-07-23 DIAGNOSIS — I255 Ischemic cardiomyopathy: Secondary | ICD-10-CM | POA: Diagnosis not present

## 2021-07-23 DIAGNOSIS — R001 Bradycardia, unspecified: Secondary | ICD-10-CM

## 2021-07-23 LAB — CUP PACEART INCLINIC DEVICE CHECK
Battery Remaining Longevity: 27 mo
Battery Voltage: 2.91 V
Brady Statistic AP VP Percent: 23.65 %
Brady Statistic AP VS Percent: 0.01 %
Brady Statistic AS VP Percent: 76.08 %
Brady Statistic AS VS Percent: 0.26 %
Brady Statistic RA Percent Paced: 23.32 %
Brady Statistic RV Percent Paced: 99.72 %
Date Time Interrogation Session: 20230525090454
Implantable Lead Implant Date: 20171214
Implantable Lead Implant Date: 20171214
Implantable Lead Implant Date: 20171214
Implantable Lead Location: 753858
Implantable Lead Location: 753859
Implantable Lead Location: 753860
Implantable Lead Model: 4598
Implantable Lead Model: 5076
Implantable Lead Model: 5076
Implantable Pulse Generator Implant Date: 20171214
Lead Channel Impedance Value: 285 Ohm
Lead Channel Impedance Value: 323 Ohm
Lead Channel Impedance Value: 342 Ohm
Lead Channel Impedance Value: 342 Ohm
Lead Channel Impedance Value: 361 Ohm
Lead Channel Impedance Value: 361 Ohm
Lead Channel Impedance Value: 380 Ohm
Lead Channel Impedance Value: 380 Ohm
Lead Channel Impedance Value: 437 Ohm
Lead Channel Impedance Value: 494 Ohm
Lead Channel Impedance Value: 513 Ohm
Lead Channel Impedance Value: 513 Ohm
Lead Channel Impedance Value: 532 Ohm
Lead Channel Impedance Value: 551 Ohm
Lead Channel Pacing Threshold Amplitude: 0.625 V
Lead Channel Pacing Threshold Amplitude: 0.625 V
Lead Channel Pacing Threshold Amplitude: 1.875 V
Lead Channel Pacing Threshold Pulse Width: 0.4 ms
Lead Channel Pacing Threshold Pulse Width: 0.4 ms
Lead Channel Pacing Threshold Pulse Width: 0.4 ms
Lead Channel Sensing Intrinsic Amplitude: 1.25 mV
Lead Channel Sensing Intrinsic Amplitude: 1.25 mV
Lead Channel Sensing Intrinsic Amplitude: 19.75 mV
Lead Channel Sensing Intrinsic Amplitude: 19.75 mV
Lead Channel Setting Pacing Amplitude: 2 V
Lead Channel Setting Pacing Amplitude: 2.5 V
Lead Channel Setting Pacing Amplitude: 2.5 V
Lead Channel Setting Pacing Pulse Width: 0.4 ms
Lead Channel Setting Pacing Pulse Width: 0.4 ms
Lead Channel Setting Sensing Sensitivity: 2.8 mV

## 2021-07-23 NOTE — Patient Instructions (Signed)
Medication Instructions:  Your physician recommends that you continue on your current medications as directed. Please refer to the Current Medication list given to you today.  *If you need a refill on your cardiac medications before your next appointment, please call your pharmacy*   Lab Work: None  If you have labs (blood work) drawn today and your tests are completely normal, you will receive your results only by: MyChart Message (if you have MyChart) OR A paper copy in the mail If you have any lab test that is abnormal or we need to change your treatment, we will call you to review the results.   Follow-Up: At [facility], you and your health needs are our priority.  As part of our continuing mission to provide you with exceptional heart care, we have created designated Provider Care Teams.  These Care Teams include your primary Cardiologist (physician) and Advanced Practice Providers (APPs -  Physician Assistants and Nurse Practitioners) who all work together to provide you with the care you need, when you need it.  We recommend signing up for the patient portal called "MyChart".  Sign up information is provided on this After Visit Summary.  MyChart is used to connect with patients for Virtual Visits (Telemedicine).  Patients are able to view lab/test results, encounter notes, upcoming appointments, etc.  Non-urgent messages can be sent to your provider as well.   To learn more about what you can do with MyChart, go to [email or web address].    Your next appointment:   1 year(s)  The format for your next appointment:   In Person  Provider:   Gregg Taylor, MD{ 

## 2021-07-30 NOTE — Progress Notes (Signed)
Remote pacemaker transmission.   

## 2021-10-13 ENCOUNTER — Ambulatory Visit (INDEPENDENT_AMBULATORY_CARE_PROVIDER_SITE_OTHER): Payer: Medicare HMO

## 2021-10-13 DIAGNOSIS — I255 Ischemic cardiomyopathy: Secondary | ICD-10-CM | POA: Diagnosis not present

## 2021-10-13 LAB — CUP PACEART REMOTE DEVICE CHECK
Battery Remaining Longevity: 26 mo
Battery Voltage: 2.9 V
Brady Statistic AP VP Percent: 36.15 %
Brady Statistic AP VS Percent: 0.01 %
Brady Statistic AS VP Percent: 63.6 %
Brady Statistic AS VS Percent: 0.24 %
Brady Statistic RA Percent Paced: 35.98 %
Brady Statistic RV Percent Paced: 99.74 %
Date Time Interrogation Session: 20230815002518
Implantable Lead Implant Date: 20171214
Implantable Lead Implant Date: 20171214
Implantable Lead Implant Date: 20171214
Implantable Lead Location: 753858
Implantable Lead Location: 753859
Implantable Lead Location: 753860
Implantable Lead Model: 4598
Implantable Lead Model: 5076
Implantable Lead Model: 5076
Implantable Pulse Generator Implant Date: 20171214
Lead Channel Impedance Value: 285 Ohm
Lead Channel Impedance Value: 304 Ohm
Lead Channel Impedance Value: 323 Ohm
Lead Channel Impedance Value: 342 Ohm
Lead Channel Impedance Value: 342 Ohm
Lead Channel Impedance Value: 342 Ohm
Lead Channel Impedance Value: 361 Ohm
Lead Channel Impedance Value: 361 Ohm
Lead Channel Impedance Value: 399 Ohm
Lead Channel Impedance Value: 494 Ohm
Lead Channel Impedance Value: 494 Ohm
Lead Channel Impedance Value: 513 Ohm
Lead Channel Impedance Value: 551 Ohm
Lead Channel Impedance Value: 570 Ohm
Lead Channel Pacing Threshold Amplitude: 0.5 V
Lead Channel Pacing Threshold Amplitude: 0.5 V
Lead Channel Pacing Threshold Amplitude: 1.5 V
Lead Channel Pacing Threshold Pulse Width: 0.4 ms
Lead Channel Pacing Threshold Pulse Width: 0.4 ms
Lead Channel Pacing Threshold Pulse Width: 0.4 ms
Lead Channel Sensing Intrinsic Amplitude: 1.25 mV
Lead Channel Sensing Intrinsic Amplitude: 1.25 mV
Lead Channel Sensing Intrinsic Amplitude: 18.5 mV
Lead Channel Sensing Intrinsic Amplitude: 18.5 mV
Lead Channel Setting Pacing Amplitude: 2 V
Lead Channel Setting Pacing Amplitude: 2.25 V
Lead Channel Setting Pacing Amplitude: 2.5 V
Lead Channel Setting Pacing Pulse Width: 0.4 ms
Lead Channel Setting Pacing Pulse Width: 0.4 ms
Lead Channel Setting Sensing Sensitivity: 2.8 mV

## 2021-11-13 NOTE — Progress Notes (Signed)
Remote pacemaker transmission.   

## 2022-01-12 ENCOUNTER — Ambulatory Visit (INDEPENDENT_AMBULATORY_CARE_PROVIDER_SITE_OTHER): Payer: Medicare HMO

## 2022-01-12 DIAGNOSIS — I255 Ischemic cardiomyopathy: Secondary | ICD-10-CM

## 2022-01-13 LAB — CUP PACEART REMOTE DEVICE CHECK
Battery Remaining Longevity: 25 mo
Battery Voltage: 2.89 V
Brady Statistic AP VP Percent: 24.48 %
Brady Statistic AP VS Percent: 0.01 %
Brady Statistic AS VP Percent: 75.28 %
Brady Statistic AS VS Percent: 0.23 %
Brady Statistic RA Percent Paced: 24.3 %
Brady Statistic RV Percent Paced: 99.75 %
Date Time Interrogation Session: 20231115101014
Implantable Lead Connection Status: 753985
Implantable Lead Connection Status: 753985
Implantable Lead Connection Status: 753985
Implantable Lead Implant Date: 20171214
Implantable Lead Implant Date: 20171214
Implantable Lead Implant Date: 20171214
Implantable Lead Location: 753858
Implantable Lead Location: 753859
Implantable Lead Location: 753860
Implantable Lead Model: 4598
Implantable Lead Model: 5076
Implantable Lead Model: 5076
Implantable Pulse Generator Implant Date: 20171214
Lead Channel Impedance Value: 304 Ohm
Lead Channel Impedance Value: 323 Ohm
Lead Channel Impedance Value: 342 Ohm
Lead Channel Impedance Value: 361 Ohm
Lead Channel Impedance Value: 361 Ohm
Lead Channel Impedance Value: 361 Ohm
Lead Channel Impedance Value: 361 Ohm
Lead Channel Impedance Value: 380 Ohm
Lead Channel Impedance Value: 418 Ohm
Lead Channel Impedance Value: 513 Ohm
Lead Channel Impedance Value: 532 Ohm
Lead Channel Impedance Value: 532 Ohm
Lead Channel Impedance Value: 570 Ohm
Lead Channel Impedance Value: 589 Ohm
Lead Channel Pacing Threshold Amplitude: 0.5 V
Lead Channel Pacing Threshold Amplitude: 0.5 V
Lead Channel Pacing Threshold Amplitude: 1.75 V
Lead Channel Pacing Threshold Pulse Width: 0.4 ms
Lead Channel Pacing Threshold Pulse Width: 0.4 ms
Lead Channel Pacing Threshold Pulse Width: 0.4 ms
Lead Channel Sensing Intrinsic Amplitude: 1.5 mV
Lead Channel Sensing Intrinsic Amplitude: 1.5 mV
Lead Channel Sensing Intrinsic Amplitude: 20.5 mV
Lead Channel Sensing Intrinsic Amplitude: 20.5 mV
Lead Channel Setting Pacing Amplitude: 2 V
Lead Channel Setting Pacing Amplitude: 2.25 V
Lead Channel Setting Pacing Amplitude: 2.5 V
Lead Channel Setting Pacing Pulse Width: 0.4 ms
Lead Channel Setting Pacing Pulse Width: 0.4 ms
Lead Channel Setting Sensing Sensitivity: 2.8 mV
Zone Setting Status: 755011
Zone Setting Status: 755011

## 2022-02-08 NOTE — Progress Notes (Signed)
Remote pacemaker transmission.   

## 2022-04-13 ENCOUNTER — Ambulatory Visit: Payer: Medicare HMO

## 2022-04-13 DIAGNOSIS — I255 Ischemic cardiomyopathy: Secondary | ICD-10-CM

## 2022-04-14 LAB — CUP PACEART REMOTE DEVICE CHECK
Battery Remaining Longevity: 22 mo
Battery Voltage: 2.87 V
Brady Statistic AP VP Percent: 19.7 %
Brady Statistic AP VS Percent: 0.02 %
Brady Statistic AS VP Percent: 80.14 %
Brady Statistic AS VS Percent: 0.14 %
Brady Statistic RA Percent Paced: 19.46 %
Brady Statistic RV Percent Paced: 99.83 %
Date Time Interrogation Session: 20240214001809
Implantable Lead Connection Status: 753985
Implantable Lead Connection Status: 753985
Implantable Lead Connection Status: 753985
Implantable Lead Implant Date: 20171214
Implantable Lead Implant Date: 20171214
Implantable Lead Implant Date: 20171214
Implantable Lead Location: 753858
Implantable Lead Location: 753859
Implantable Lead Location: 753860
Implantable Lead Model: 4598
Implantable Lead Model: 5076
Implantable Lead Model: 5076
Implantable Pulse Generator Implant Date: 20171214
Lead Channel Impedance Value: 266 Ohm
Lead Channel Impedance Value: 304 Ohm
Lead Channel Impedance Value: 304 Ohm
Lead Channel Impedance Value: 323 Ohm
Lead Channel Impedance Value: 323 Ohm
Lead Channel Impedance Value: 323 Ohm
Lead Channel Impedance Value: 342 Ohm
Lead Channel Impedance Value: 361 Ohm
Lead Channel Impedance Value: 399 Ohm
Lead Channel Impedance Value: 475 Ohm
Lead Channel Impedance Value: 494 Ohm
Lead Channel Impedance Value: 494 Ohm
Lead Channel Impedance Value: 532 Ohm
Lead Channel Impedance Value: 532 Ohm
Lead Channel Pacing Threshold Amplitude: 0.625 V
Lead Channel Pacing Threshold Amplitude: 0.625 V
Lead Channel Pacing Threshold Amplitude: 1.625 V
Lead Channel Pacing Threshold Pulse Width: 0.4 ms
Lead Channel Pacing Threshold Pulse Width: 0.4 ms
Lead Channel Pacing Threshold Pulse Width: 0.4 ms
Lead Channel Sensing Intrinsic Amplitude: 0.75 mV
Lead Channel Sensing Intrinsic Amplitude: 0.75 mV
Lead Channel Sensing Intrinsic Amplitude: 19.25 mV
Lead Channel Sensing Intrinsic Amplitude: 19.25 mV
Lead Channel Setting Pacing Amplitude: 2 V
Lead Channel Setting Pacing Amplitude: 2.25 V
Lead Channel Setting Pacing Amplitude: 2.5 V
Lead Channel Setting Pacing Pulse Width: 0.4 ms
Lead Channel Setting Pacing Pulse Width: 0.4 ms
Lead Channel Setting Sensing Sensitivity: 2.8 mV
Zone Setting Status: 755011
Zone Setting Status: 755011

## 2022-05-14 NOTE — Progress Notes (Signed)
Remote pacemaker transmission.   

## 2022-07-12 ENCOUNTER — Ambulatory Visit (INDEPENDENT_AMBULATORY_CARE_PROVIDER_SITE_OTHER): Payer: Medicare HMO

## 2022-07-12 DIAGNOSIS — I441 Atrioventricular block, second degree: Secondary | ICD-10-CM | POA: Diagnosis not present

## 2022-07-12 LAB — CUP PACEART REMOTE DEVICE CHECK
Battery Remaining Longevity: 17 mo
Battery Voltage: 2.84 V
Brady Statistic AP VP Percent: 34.24 %
Brady Statistic AP VS Percent: 0.01 %
Brady Statistic AS VP Percent: 65.6 %
Brady Statistic AS VS Percent: 0.15 %
Brady Statistic RA Percent Paced: 33.97 %
Brady Statistic RV Percent Paced: 99.83 %
Date Time Interrogation Session: 20240513005813
Implantable Lead Connection Status: 753985
Implantable Lead Connection Status: 753985
Implantable Lead Connection Status: 753985
Implantable Lead Implant Date: 20171214
Implantable Lead Implant Date: 20171214
Implantable Lead Implant Date: 20171214
Implantable Lead Location: 753858
Implantable Lead Location: 753859
Implantable Lead Location: 753860
Implantable Lead Model: 4598
Implantable Lead Model: 5076
Implantable Lead Model: 5076
Implantable Pulse Generator Implant Date: 20171214
Lead Channel Impedance Value: 304 Ohm
Lead Channel Impedance Value: 323 Ohm
Lead Channel Impedance Value: 342 Ohm
Lead Channel Impedance Value: 342 Ohm
Lead Channel Impedance Value: 342 Ohm
Lead Channel Impedance Value: 361 Ohm
Lead Channel Impedance Value: 361 Ohm
Lead Channel Impedance Value: 380 Ohm
Lead Channel Impedance Value: 437 Ohm
Lead Channel Impedance Value: 513 Ohm
Lead Channel Impedance Value: 532 Ohm
Lead Channel Impedance Value: 532 Ohm
Lead Channel Impedance Value: 551 Ohm
Lead Channel Impedance Value: 551 Ohm
Lead Channel Pacing Threshold Amplitude: 0.625 V
Lead Channel Pacing Threshold Amplitude: 0.625 V
Lead Channel Pacing Threshold Amplitude: 1.5 V
Lead Channel Pacing Threshold Pulse Width: 0.4 ms
Lead Channel Pacing Threshold Pulse Width: 0.4 ms
Lead Channel Pacing Threshold Pulse Width: 0.4 ms
Lead Channel Sensing Intrinsic Amplitude: 1.5 mV
Lead Channel Sensing Intrinsic Amplitude: 1.5 mV
Lead Channel Sensing Intrinsic Amplitude: 20 mV
Lead Channel Sensing Intrinsic Amplitude: 20 mV
Lead Channel Setting Pacing Amplitude: 2 V
Lead Channel Setting Pacing Amplitude: 2 V
Lead Channel Setting Pacing Amplitude: 2.5 V
Lead Channel Setting Pacing Pulse Width: 0.4 ms
Lead Channel Setting Pacing Pulse Width: 0.4 ms
Lead Channel Setting Sensing Sensitivity: 2.8 mV
Zone Setting Status: 755011
Zone Setting Status: 755011

## 2022-08-04 NOTE — Progress Notes (Signed)
Remote pacemaker transmission.   

## 2022-10-11 ENCOUNTER — Ambulatory Visit (INDEPENDENT_AMBULATORY_CARE_PROVIDER_SITE_OTHER): Payer: Medicare HMO

## 2022-10-11 DIAGNOSIS — I441 Atrioventricular block, second degree: Secondary | ICD-10-CM

## 2022-10-11 LAB — CUP PACEART REMOTE DEVICE CHECK
Battery Remaining Longevity: 13 mo
Battery Voltage: 2.8 V
Brady Statistic AP VP Percent: 22.69 %
Brady Statistic AP VS Percent: 0.01 %
Brady Statistic AS VP Percent: 77.13 %
Brady Statistic AS VS Percent: 0.18 %
Brady Statistic RA Percent Paced: 22.24 %
Brady Statistic RV Percent Paced: 99.8 %
Date Time Interrogation Session: 20240812085031
Implantable Lead Connection Status: 753985
Implantable Lead Connection Status: 753985
Implantable Lead Connection Status: 753985
Implantable Lead Implant Date: 20171214
Implantable Lead Implant Date: 20171214
Implantable Lead Implant Date: 20171214
Implantable Lead Location: 753858
Implantable Lead Location: 753859
Implantable Lead Location: 753860
Implantable Lead Model: 4598
Implantable Lead Model: 5076
Implantable Lead Model: 5076
Implantable Pulse Generator Implant Date: 20171214
Lead Channel Impedance Value: 266 Ohm
Lead Channel Impedance Value: 304 Ohm
Lead Channel Impedance Value: 304 Ohm
Lead Channel Impedance Value: 323 Ohm
Lead Channel Impedance Value: 323 Ohm
Lead Channel Impedance Value: 323 Ohm
Lead Channel Impedance Value: 323 Ohm
Lead Channel Impedance Value: 361 Ohm
Lead Channel Impedance Value: 399 Ohm
Lead Channel Impedance Value: 456 Ohm
Lead Channel Impedance Value: 475 Ohm
Lead Channel Impedance Value: 494 Ohm
Lead Channel Impedance Value: 532 Ohm
Lead Channel Impedance Value: 532 Ohm
Lead Channel Pacing Threshold Amplitude: 0.625 V
Lead Channel Pacing Threshold Amplitude: 0.625 V
Lead Channel Pacing Threshold Amplitude: 1.625 V
Lead Channel Pacing Threshold Pulse Width: 0.4 ms
Lead Channel Pacing Threshold Pulse Width: 0.4 ms
Lead Channel Pacing Threshold Pulse Width: 0.4 ms
Lead Channel Sensing Intrinsic Amplitude: 1.125 mV
Lead Channel Sensing Intrinsic Amplitude: 1.125 mV
Lead Channel Sensing Intrinsic Amplitude: 19.75 mV
Lead Channel Sensing Intrinsic Amplitude: 19.75 mV
Lead Channel Setting Pacing Amplitude: 2 V
Lead Channel Setting Pacing Amplitude: 2.25 V
Lead Channel Setting Pacing Amplitude: 2.5 V
Lead Channel Setting Pacing Pulse Width: 0.4 ms
Lead Channel Setting Pacing Pulse Width: 0.4 ms
Lead Channel Setting Sensing Sensitivity: 2.8 mV
Zone Setting Status: 755011
Zone Setting Status: 755011

## 2022-10-25 NOTE — Progress Notes (Signed)
Remote pacemaker transmission.   

## 2023-01-10 ENCOUNTER — Ambulatory Visit (INDEPENDENT_AMBULATORY_CARE_PROVIDER_SITE_OTHER): Payer: Medicare HMO

## 2023-01-10 DIAGNOSIS — I441 Atrioventricular block, second degree: Secondary | ICD-10-CM | POA: Diagnosis not present

## 2023-01-11 LAB — CUP PACEART REMOTE DEVICE CHECK
Battery Remaining Longevity: 11 mo
Battery Voltage: 2.74 V
Brady Statistic AP VP Percent: 28.4 %
Brady Statistic AP VS Percent: 0.05 %
Brady Statistic AS VP Percent: 70.45 %
Brady Statistic AS VS Percent: 1.11 %
Brady Statistic RA Percent Paced: 28.72 %
Brady Statistic RV Percent Paced: 98.84 %
Date Time Interrogation Session: 20241111175340
Implantable Lead Connection Status: 753985
Implantable Lead Connection Status: 753985
Implantable Lead Connection Status: 753985
Implantable Lead Implant Date: 20171214
Implantable Lead Implant Date: 20171214
Implantable Lead Implant Date: 20171214
Implantable Lead Location: 753858
Implantable Lead Location: 753859
Implantable Lead Location: 753860
Implantable Lead Model: 4598
Implantable Lead Model: 5076
Implantable Lead Model: 5076
Implantable Pulse Generator Implant Date: 20171214
Lead Channel Impedance Value: 304 Ohm
Lead Channel Impedance Value: 304 Ohm
Lead Channel Impedance Value: 342 Ohm
Lead Channel Impedance Value: 342 Ohm
Lead Channel Impedance Value: 342 Ohm
Lead Channel Impedance Value: 342 Ohm
Lead Channel Impedance Value: 342 Ohm
Lead Channel Impedance Value: 361 Ohm
Lead Channel Impedance Value: 418 Ohm
Lead Channel Impedance Value: 513 Ohm
Lead Channel Impedance Value: 513 Ohm
Lead Channel Impedance Value: 532 Ohm
Lead Channel Impedance Value: 551 Ohm
Lead Channel Impedance Value: 570 Ohm
Lead Channel Pacing Threshold Amplitude: 0.5 V
Lead Channel Pacing Threshold Amplitude: 0.625 V
Lead Channel Pacing Threshold Amplitude: 1.375 V
Lead Channel Pacing Threshold Pulse Width: 0.4 ms
Lead Channel Pacing Threshold Pulse Width: 0.4 ms
Lead Channel Pacing Threshold Pulse Width: 0.4 ms
Lead Channel Sensing Intrinsic Amplitude: 1.375 mV
Lead Channel Sensing Intrinsic Amplitude: 1.375 mV
Lead Channel Sensing Intrinsic Amplitude: 20 mV
Lead Channel Sensing Intrinsic Amplitude: 20 mV
Lead Channel Setting Pacing Amplitude: 2 V
Lead Channel Setting Pacing Amplitude: 2 V
Lead Channel Setting Pacing Amplitude: 2.5 V
Lead Channel Setting Pacing Pulse Width: 0.4 ms
Lead Channel Setting Pacing Pulse Width: 0.4 ms
Lead Channel Setting Sensing Sensitivity: 2.8 mV
Zone Setting Status: 755011
Zone Setting Status: 755011

## 2023-02-02 NOTE — Progress Notes (Signed)
Remote pacemaker transmission.   

## 2023-04-11 ENCOUNTER — Ambulatory Visit (INDEPENDENT_AMBULATORY_CARE_PROVIDER_SITE_OTHER): Payer: Medicare HMO

## 2023-04-11 DIAGNOSIS — I441 Atrioventricular block, second degree: Secondary | ICD-10-CM | POA: Diagnosis not present

## 2023-04-12 LAB — CUP PACEART REMOTE DEVICE CHECK
Battery Remaining Longevity: 6 mo
Battery Voltage: 2.66 V
Brady Statistic AP VP Percent: 31.74 %
Brady Statistic AP VS Percent: 0.05 %
Brady Statistic AS VP Percent: 66.16 %
Brady Statistic AS VS Percent: 2.05 %
Brady Statistic RA Percent Paced: 33.06 %
Brady Statistic RV Percent Paced: 97.89 %
Date Time Interrogation Session: 20250209232926
Implantable Lead Connection Status: 753985
Implantable Lead Connection Status: 753985
Implantable Lead Connection Status: 753985
Implantable Lead Implant Date: 20171214
Implantable Lead Implant Date: 20171214
Implantable Lead Implant Date: 20171214
Implantable Lead Location: 753858
Implantable Lead Location: 753859
Implantable Lead Location: 753860
Implantable Lead Model: 4598
Implantable Lead Model: 5076
Implantable Lead Model: 5076
Implantable Pulse Generator Implant Date: 20171214
Lead Channel Impedance Value: 285 Ohm
Lead Channel Impedance Value: 285 Ohm
Lead Channel Impedance Value: 285 Ohm
Lead Channel Impedance Value: 304 Ohm
Lead Channel Impedance Value: 304 Ohm
Lead Channel Impedance Value: 323 Ohm
Lead Channel Impedance Value: 323 Ohm
Lead Channel Impedance Value: 342 Ohm
Lead Channel Impedance Value: 380 Ohm
Lead Channel Impedance Value: 475 Ohm
Lead Channel Impedance Value: 475 Ohm
Lead Channel Impedance Value: 475 Ohm
Lead Channel Impedance Value: 494 Ohm
Lead Channel Impedance Value: 494 Ohm
Lead Channel Pacing Threshold Amplitude: 0.5 V
Lead Channel Pacing Threshold Amplitude: 0.75 V
Lead Channel Pacing Threshold Amplitude: 1.5 V
Lead Channel Pacing Threshold Pulse Width: 0.4 ms
Lead Channel Pacing Threshold Pulse Width: 0.4 ms
Lead Channel Pacing Threshold Pulse Width: 0.4 ms
Lead Channel Sensing Intrinsic Amplitude: 0.75 mV
Lead Channel Sensing Intrinsic Amplitude: 0.75 mV
Lead Channel Sensing Intrinsic Amplitude: 19 mV
Lead Channel Sensing Intrinsic Amplitude: 19 mV
Lead Channel Setting Pacing Amplitude: 2 V
Lead Channel Setting Pacing Amplitude: 2.25 V
Lead Channel Setting Pacing Amplitude: 2.5 V
Lead Channel Setting Pacing Pulse Width: 0.4 ms
Lead Channel Setting Pacing Pulse Width: 0.4 ms
Lead Channel Setting Sensing Sensitivity: 2.8 mV
Zone Setting Status: 755011
Zone Setting Status: 755011

## 2023-05-09 ENCOUNTER — Ambulatory Visit (INDEPENDENT_AMBULATORY_CARE_PROVIDER_SITE_OTHER): Payer: Medicare HMO

## 2023-05-09 DIAGNOSIS — I441 Atrioventricular block, second degree: Secondary | ICD-10-CM

## 2023-05-10 LAB — CUP PACEART REMOTE DEVICE CHECK
Battery Remaining Longevity: 5 mo
Battery Voltage: 2.64 V
Brady Statistic AP VP Percent: 23.49 %
Brady Statistic AP VS Percent: 0.03 %
Brady Statistic AS VP Percent: 74.31 %
Brady Statistic AS VS Percent: 2.17 %
Brady Statistic RA Percent Paced: 25.16 %
Brady Statistic RV Percent Paced: 97.8 %
Date Time Interrogation Session: 20250310002527
Implantable Lead Connection Status: 753985
Implantable Lead Connection Status: 753985
Implantable Lead Connection Status: 753985
Implantable Lead Implant Date: 20171214
Implantable Lead Implant Date: 20171214
Implantable Lead Implant Date: 20171214
Implantable Lead Location: 753858
Implantable Lead Location: 753859
Implantable Lead Location: 753860
Implantable Lead Model: 4598
Implantable Lead Model: 5076
Implantable Lead Model: 5076
Implantable Pulse Generator Implant Date: 20171214
Lead Channel Impedance Value: 266 Ohm
Lead Channel Impedance Value: 285 Ohm
Lead Channel Impedance Value: 285 Ohm
Lead Channel Impedance Value: 285 Ohm
Lead Channel Impedance Value: 304 Ohm
Lead Channel Impedance Value: 323 Ohm
Lead Channel Impedance Value: 323 Ohm
Lead Channel Impedance Value: 342 Ohm
Lead Channel Impedance Value: 380 Ohm
Lead Channel Impedance Value: 456 Ohm
Lead Channel Impedance Value: 475 Ohm
Lead Channel Impedance Value: 513 Ohm
Lead Channel Impedance Value: 513 Ohm
Lead Channel Impedance Value: 532 Ohm
Lead Channel Pacing Threshold Amplitude: 0.625 V
Lead Channel Pacing Threshold Amplitude: 0.75 V
Lead Channel Pacing Threshold Amplitude: 1.375 V
Lead Channel Pacing Threshold Pulse Width: 0.4 ms
Lead Channel Pacing Threshold Pulse Width: 0.4 ms
Lead Channel Pacing Threshold Pulse Width: 0.4 ms
Lead Channel Sensing Intrinsic Amplitude: 0.625 mV
Lead Channel Sensing Intrinsic Amplitude: 0.625 mV
Lead Channel Sensing Intrinsic Amplitude: 18.75 mV
Lead Channel Sensing Intrinsic Amplitude: 18.75 mV
Lead Channel Setting Pacing Amplitude: 2 V
Lead Channel Setting Pacing Amplitude: 2 V
Lead Channel Setting Pacing Amplitude: 2.5 V
Lead Channel Setting Pacing Pulse Width: 0.4 ms
Lead Channel Setting Pacing Pulse Width: 0.4 ms
Lead Channel Setting Sensing Sensitivity: 2.8 mV
Zone Setting Status: 755011
Zone Setting Status: 755011

## 2023-05-11 ENCOUNTER — Telehealth: Payer: Self-pay

## 2023-05-11 NOTE — Telephone Encounter (Signed)
 Received request for device clearance for a R knee arthroplasty on patient. He has not been seen in clinic since 2023. Past due for follow up.  (Also device is nearing ERI <11mths).  Forwarding to scheduling to get patient set up for appt: Preop clearance/Device nearing ERI.

## 2023-05-12 NOTE — Progress Notes (Unsigned)
 Cardiology Office Note:  .   Date:  05/12/2023  ID:  [name] [name], DOB 1950-07-27, MRN [id number] PCP: Maudie Flakes, FNP  [facility] Providers Cardiologist:  Peter Swaziland, MD Electrophysiologist:  Lewayne Bunting, MD {  History of Present Illness: .   Lorenz Donley is a 73 y.o. male w/PMHx of  DM, HTN, CKD (IV) CAD (cath noted below, 2014)) ICM Advanced heart block w/PPM AFib  Last seen in the clinic by [facility] 07/23/21, doing well, LVEF recovered with CRT, low AFib burden, no changes made  Device clinic request for device management recommendations for knee surgery, deferred to an in clinic check given last was in 2023   05/11/23 BUN/Creat 41/2.42 (looks his baseline)  ROS:   He is doing great! 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  No CP, palpitations, or cardiac awareness No SOB, DOE No near syncope or syncope No bleeding or signs of bleeding  Device information MDT CRT-P, implanted 02/12/2016  Arrhythmia/AAD hx AFib 2014  Studies Reviewed: Marland Kitchen    EKG done today and reviewed by myself:  SR/V paced 66bpm  DEVICE interrogation done today and reviewed by myself Battery and lead measurements are good Effective BP 99.3% NSVT (6 since 2023), not all have EGMs to review AT/AF 82 (since 2023) > available EGMs reviewed, are FF and PACs   03/02/2016: TTE Study Conclusions  - Left ventricle: The cavity size was normal. There was mild    concentric hypertrophy. Systolic function was normal. The    estimated ejection fraction was in the range of 50% to 55%. Wall    motion was normal; there were no regional wall motion    abnormalities. Features are consistent with a pseudonormal left    ventricular filling pattern, with concomitant abnormal relaxation    and increased filling pressure (grade 2 diastolic  dysfunction).  - Mitral valve: There was mild regurgitation directed centrally.  - Left atrium: The atrium was moderately to severely dilated.  - Pulmonary arteries: Systolic pressure was mildly increased. PA    peak pressure: 35 mm Hg (S).    03/28/2012: stress myoview IMPRESSION: 1. Mild LV systolic dysfunction, EF 42% with inferior and inferoseptal hypokinesis. 2. Fixed medium-sized, severe basal to mid inferior and inferoseptal perfusion defect.  This suggests prior infarction with minimal ischemia. 3. Intermediate risk study.   03/21/2012: [facility] Procedural Findings: Hemodynamics: AO 150/68 with a mean of 96 mm Hg LV 151/21 mm Hg   Coronary angiography: Coronary dominance: right Left mainstem: 30% distal left main. Left anterior descending (LAD): Mild wall irregularities. There is a large diagonal with a 90-95% ostial lesion. Left circumflex (LCx): The left circumflex gives rise to a single large marginal branch. There is a 70-80% ostial LCX lesion. Right coronary artery (RCA): The RCA is occluded proximally with evidence of recent thrombus. There are left to right collaterals to the distal RCA Left ventriculography: Not performed.   Final Conclusions:   1. 3 vessel obstructive CAD. Recent RCA occlusion. Recommendations: Would maximize medical therapy. Consider stress myoview on medical therapy to assess ischemic burden and symptoms. Ostial location of diagonal and LCX disease is not optimal for PCI. If significant symptoms or ischemia on medical therapy may need to consider CABG.  Risk Assessment/Calculations:    Physical Exam:   VS:  There were no vitals taken for this visit.  Wt Readings from Last 3 Encounters:  07/23/21 254 lb (115.2 kg)  06/03/20 256 lb (116.1 kg)  05/21/19 272 lb 9.6 oz (123.7 kg)    GEN: Well nourished, well developed in no acute distress NECK: No JVD; No carotid bruits CARDIAC: RRR, no murmurs, rubs, gallops RESPIRATORY:  CTA b/l without rales,  wheezing or rhonchi  ABDOMEN: Soft, non-tender, non-distended EXTREMITIES:  No edema; No deformity   PPM site: (R sided) is stable, no thinning, fluctuation, tethering  ASSESSMENT AND PLAN: .    CRT-P intact function no programming changes made  ~ 6 mo to ERI  No particular device management needed for knee surgery He reports advised to hold his Xarelto 3d ahead of his knee surgery  paroxysmal AFib CHA2DS2Vasc is 5, on Xarelto low burden  AFTER the patient leftm, in review of his care everywhere labs, Xarelto dose given his Creat should be 15mg  daily My MA has called and left a message for him to call back to discuss his medications Spoke to the patient Made aware of the need to reduce his xarelto to 15mg  daily and why   CAD No anginal symptoms ICM Chronic CHF (systolic) Recovered LVEF by his echo 2018 No symptoms or exam findings to suggest volume OL OptiVol has been up though corrected No symptoms to suggest any clinical changes Needs to be caught up with Dr.Jordan/team  Secondary hypercoagulable state 2/2 AFib     Dispo: remotes as usual, back again in 68mo, sooner if needed  Signed, Sheilah Pigeon, PA-C

## 2023-05-13 ENCOUNTER — Ambulatory Visit: Attending: Cardiology | Admitting: Physician Assistant

## 2023-05-13 ENCOUNTER — Telehealth: Payer: Self-pay | Admitting: Cardiology

## 2023-05-13 VITALS — BP 146/70 | HR 66 | Ht 72.0 in | Wt 254.0 lb

## 2023-05-13 DIAGNOSIS — D6869 Other thrombophilia: Secondary | ICD-10-CM

## 2023-05-13 DIAGNOSIS — Z950 Presence of cardiac pacemaker: Secondary | ICD-10-CM

## 2023-05-13 DIAGNOSIS — I480 Paroxysmal atrial fibrillation: Secondary | ICD-10-CM | POA: Diagnosis not present

## 2023-05-13 DIAGNOSIS — I5022 Chronic systolic (congestive) heart failure: Secondary | ICD-10-CM

## 2023-05-13 DIAGNOSIS — I255 Ischemic cardiomyopathy: Secondary | ICD-10-CM

## 2023-05-13 DIAGNOSIS — I2510 Atherosclerotic heart disease of native coronary artery without angina pectoris: Secondary | ICD-10-CM

## 2023-05-13 LAB — CUP PACEART INCLINIC DEVICE CHECK
Battery Remaining Longevity: 6 mo
Battery Voltage: 2.64 V
Brady Statistic AP VP Percent: 27.83 %
Brady Statistic AP VS Percent: 0.02 %
Brady Statistic AS VP Percent: 71.47 %
Brady Statistic AS VS Percent: 0.67 %
Brady Statistic RA Percent Paced: 27.97 %
Brady Statistic RV Percent Paced: 99.3 %
Date Time Interrogation Session: 20250314134506
Implantable Lead Connection Status: 753985
Implantable Lead Connection Status: 753985
Implantable Lead Connection Status: 753985
Implantable Lead Implant Date: 20171214
Implantable Lead Implant Date: 20171214
Implantable Lead Implant Date: 20171214
Implantable Lead Location: 753858
Implantable Lead Location: 753859
Implantable Lead Location: 753860
Implantable Lead Model: 4598
Implantable Lead Model: 5076
Implantable Lead Model: 5076
Implantable Pulse Generator Implant Date: 20171214
Lead Channel Impedance Value: 285 Ohm
Lead Channel Impedance Value: 285 Ohm
Lead Channel Impedance Value: 304 Ohm
Lead Channel Impedance Value: 304 Ohm
Lead Channel Impedance Value: 304 Ohm
Lead Channel Impedance Value: 323 Ohm
Lead Channel Impedance Value: 342 Ohm
Lead Channel Impedance Value: 361 Ohm
Lead Channel Impedance Value: 399 Ohm
Lead Channel Impedance Value: 494 Ohm
Lead Channel Impedance Value: 494 Ohm
Lead Channel Impedance Value: 513 Ohm
Lead Channel Impedance Value: 532 Ohm
Lead Channel Impedance Value: 532 Ohm
Lead Channel Pacing Threshold Amplitude: 0.625 V
Lead Channel Pacing Threshold Amplitude: 0.75 V
Lead Channel Pacing Threshold Amplitude: 1.375 V
Lead Channel Pacing Threshold Pulse Width: 0.4 ms
Lead Channel Pacing Threshold Pulse Width: 0.4 ms
Lead Channel Pacing Threshold Pulse Width: 0.4 ms
Lead Channel Sensing Intrinsic Amplitude: 0.75 mV
Lead Channel Sensing Intrinsic Amplitude: 0.75 mV
Lead Channel Sensing Intrinsic Amplitude: 18.75 mV
Lead Channel Sensing Intrinsic Amplitude: 18.75 mV
Lead Channel Setting Pacing Amplitude: 2 V
Lead Channel Setting Pacing Amplitude: 2 V
Lead Channel Setting Pacing Amplitude: 2.5 V
Lead Channel Setting Pacing Pulse Width: 0.4 ms
Lead Channel Setting Pacing Pulse Width: 0.4 ms
Lead Channel Setting Sensing Sensitivity: 2.8 mV
Zone Setting Status: 755011
Zone Setting Status: 755011

## 2023-05-13 MED ORDER — RIVAROXABAN 15 MG PO TABS: ORAL_TABLET | ORAL | 3 refills | Status: AC

## 2023-05-13 NOTE — Patient Instructions (Addendum)
 Medication Instructions:   Your physician recommends that you continue on your current medications as directed. Please refer to the Current Medication list given to you today.   *If you need a refill on your cardiac medications before your next appointment, please call your pharmacy*   Lab Work: NONE ORDERED  TODAY    If you have labs (blood work) drawn today and your tests are completely normal, you will receive your results only by: MyChart Message (if you have MyChart) OR A paper copy in the mail If you have any lab test that is abnormal or we need to change your treatment, we will call you to review the results.   Testing/Procedures: NONE ORDERED  TODAY     Follow-Up: At [facility], you and your health needs are our priority.  As part of our continuing mission to provide you with exceptional heart care, we have created designated Provider Care Teams.  These Care Teams include your primary Cardiologist (physician) and Advanced Practice Providers (APPs -  Physician Assistants and Nurse Practitioners) who all work together to provide you with the care you need, when you need it.  We recommend signing up for the patient portal called "MyChart".  Sign up information is provided on this After Visit Summary.  MyChart is used to connect with patients for Virtual Visits (Telemedicine).  Patients are able to view lab/test results, encounter notes, upcoming appointments, etc.  Non-urgent messages can be sent to your provider as well.   To learn more about what you can do with MyChart, go to [email or web address].    Your next appointment:    6 month(s)   Provider:    You may see Lewayne Bunting, MD or one of the following Advanced Practice Providers on your designated Care Team:   Francis Dowse, [place] [address] [place], [place] Sherie Don, NP Canary Brim, NP    Other Instructions

## 2023-05-13 NOTE — Addendum Note (Signed)
 Addended by: Oleta Mouse on: 05/13/2023 03:51 PM   Modules accepted: Orders

## 2023-05-13 NOTE — Telephone Encounter (Signed)
 Spoke to patient he stated he was returning someone's call.After reviewing chart unable to find who called him.He had appointment with Merita Norton PA this morning.Stated he will call surgeons office.

## 2023-05-13 NOTE — Telephone Encounter (Signed)
Patient states he is returning call. Please advise.

## 2023-05-16 NOTE — Addendum Note (Signed)
 Addended by: Geralyn Flash D on: 05/16/2023 09:57 AM   Modules accepted: Orders

## 2023-05-16 NOTE — Progress Notes (Signed)
 Remote pacemaker transmission.

## 2023-06-08 NOTE — Progress Notes (Signed)
 Remote pacemaker transmission.

## 2023-06-08 NOTE — Addendum Note (Signed)
 Addended by: Geralyn Flash D on: 06/08/2023 04:35 PM   Modules accepted: Orders, Level of Service

## 2023-06-09 ENCOUNTER — Ambulatory Visit (INDEPENDENT_AMBULATORY_CARE_PROVIDER_SITE_OTHER): Payer: Medicare HMO

## 2023-06-09 DIAGNOSIS — I255 Ischemic cardiomyopathy: Secondary | ICD-10-CM

## 2023-06-12 LAB — CUP PACEART REMOTE DEVICE CHECK
Battery Remaining Longevity: 5 mo
Battery Voltage: 2.63 V
Brady Statistic AP VP Percent: 30.92 %
Brady Statistic AP VS Percent: 0.03 %
Brady Statistic AS VP Percent: 66.15 %
Brady Statistic AS VS Percent: 2.9 %
Brady Statistic RA Percent Paced: 33.25 %
Brady Statistic RV Percent Paced: 97.07 %
Date Time Interrogation Session: 20250412002636
Implantable Lead Connection Status: 753985
Implantable Lead Connection Status: 753985
Implantable Lead Connection Status: 753985
Implantable Lead Implant Date: 20171214
Implantable Lead Implant Date: 20171214
Implantable Lead Implant Date: 20171214
Implantable Lead Location: 753858
Implantable Lead Location: 753859
Implantable Lead Location: 753860
Implantable Lead Model: 4598
Implantable Lead Model: 5076
Implantable Lead Model: 5076
Implantable Pulse Generator Implant Date: 20171214
Lead Channel Impedance Value: 304 Ohm
Lead Channel Impedance Value: 304 Ohm
Lead Channel Impedance Value: 304 Ohm
Lead Channel Impedance Value: 323 Ohm
Lead Channel Impedance Value: 323 Ohm
Lead Channel Impedance Value: 342 Ohm
Lead Channel Impedance Value: 342 Ohm
Lead Channel Impedance Value: 361 Ohm
Lead Channel Impedance Value: 399 Ohm
Lead Channel Impedance Value: 494 Ohm
Lead Channel Impedance Value: 532 Ohm
Lead Channel Impedance Value: 532 Ohm
Lead Channel Impedance Value: 551 Ohm
Lead Channel Impedance Value: 551 Ohm
Lead Channel Pacing Threshold Amplitude: 0.5 V
Lead Channel Pacing Threshold Amplitude: 0.625 V
Lead Channel Pacing Threshold Amplitude: 1.375 V
Lead Channel Pacing Threshold Pulse Width: 0.4 ms
Lead Channel Pacing Threshold Pulse Width: 0.4 ms
Lead Channel Pacing Threshold Pulse Width: 0.4 ms
Lead Channel Sensing Intrinsic Amplitude: 0.75 mV
Lead Channel Sensing Intrinsic Amplitude: 0.75 mV
Lead Channel Sensing Intrinsic Amplitude: 19.625 mV
Lead Channel Sensing Intrinsic Amplitude: 19.625 mV
Lead Channel Setting Pacing Amplitude: 2 V
Lead Channel Setting Pacing Amplitude: 2 V
Lead Channel Setting Pacing Amplitude: 2.5 V
Lead Channel Setting Pacing Pulse Width: 0.4 ms
Lead Channel Setting Pacing Pulse Width: 0.4 ms
Lead Channel Setting Sensing Sensitivity: 2.8 mV
Zone Setting Status: 755011
Zone Setting Status: 755011

## 2023-07-11 ENCOUNTER — Ambulatory Visit (INDEPENDENT_AMBULATORY_CARE_PROVIDER_SITE_OTHER): Payer: Medicare HMO

## 2023-07-11 DIAGNOSIS — I441 Atrioventricular block, second degree: Secondary | ICD-10-CM

## 2023-07-11 DIAGNOSIS — I255 Ischemic cardiomyopathy: Secondary | ICD-10-CM | POA: Diagnosis not present

## 2023-07-12 LAB — CUP PACEART REMOTE DEVICE CHECK
Battery Remaining Longevity: 4 mo
Battery Voltage: 2.62 V
Brady Statistic AP VP Percent: 34.43 %
Brady Statistic AP VS Percent: 0.07 %
Brady Statistic AS VP Percent: 63.94 %
Brady Statistic AS VS Percent: 1.56 %
Brady Statistic RA Percent Paced: 35.47 %
Brady Statistic RV Percent Paced: 98.37 %
Date Time Interrogation Session: 20250512002742
Implantable Lead Connection Status: 753985
Implantable Lead Connection Status: 753985
Implantable Lead Connection Status: 753985
Implantable Lead Implant Date: 20171214
Implantable Lead Implant Date: 20171214
Implantable Lead Implant Date: 20171214
Implantable Lead Location: 753858
Implantable Lead Location: 753859
Implantable Lead Location: 753860
Implantable Lead Model: 4598
Implantable Lead Model: 5076
Implantable Lead Model: 5076
Implantable Pulse Generator Implant Date: 20171214
Lead Channel Impedance Value: 304 Ohm
Lead Channel Impedance Value: 304 Ohm
Lead Channel Impedance Value: 323 Ohm
Lead Channel Impedance Value: 323 Ohm
Lead Channel Impedance Value: 342 Ohm
Lead Channel Impedance Value: 342 Ohm
Lead Channel Impedance Value: 342 Ohm
Lead Channel Impedance Value: 361 Ohm
Lead Channel Impedance Value: 399 Ohm
Lead Channel Impedance Value: 513 Ohm
Lead Channel Impedance Value: 513 Ohm
Lead Channel Impedance Value: 532 Ohm
Lead Channel Impedance Value: 551 Ohm
Lead Channel Impedance Value: 551 Ohm
Lead Channel Pacing Threshold Amplitude: 0.5 V
Lead Channel Pacing Threshold Amplitude: 0.625 V
Lead Channel Pacing Threshold Amplitude: 1.5 V
Lead Channel Pacing Threshold Pulse Width: 0.4 ms
Lead Channel Pacing Threshold Pulse Width: 0.4 ms
Lead Channel Pacing Threshold Pulse Width: 0.4 ms
Lead Channel Sensing Intrinsic Amplitude: 0.875 mV
Lead Channel Sensing Intrinsic Amplitude: 0.875 mV
Lead Channel Sensing Intrinsic Amplitude: 22 mV
Lead Channel Sensing Intrinsic Amplitude: 22 mV
Lead Channel Setting Pacing Amplitude: 2 V
Lead Channel Setting Pacing Amplitude: 2 V
Lead Channel Setting Pacing Amplitude: 2.5 V
Lead Channel Setting Pacing Pulse Width: 0.4 ms
Lead Channel Setting Pacing Pulse Width: 0.4 ms
Lead Channel Setting Sensing Sensitivity: 2.8 mV
Zone Setting Status: 755011
Zone Setting Status: 755011

## 2023-07-18 ENCOUNTER — Ambulatory Visit: Payer: Self-pay | Admitting: Internal Medicine

## 2023-07-18 NOTE — Addendum Note (Signed)
 Addended by: Lott Rouleau A on: 07/18/2023 11:40 AM   Modules accepted: Orders, Level of Service

## 2023-07-18 NOTE — Progress Notes (Signed)
 Remote pacemaker transmission.

## 2023-08-09 ENCOUNTER — Ambulatory Visit: Payer: Medicare HMO | Attending: Cardiology

## 2023-08-09 DIAGNOSIS — I255 Ischemic cardiomyopathy: Secondary | ICD-10-CM

## 2023-08-09 LAB — CUP PACEART REMOTE DEVICE CHECK
Battery Remaining Longevity: 3 mo
Battery Voltage: 2.61 V
Brady Statistic AP VP Percent: 38.95 %
Brady Statistic AP VS Percent: 0.07 %
Brady Statistic AS VP Percent: 59.41 %
Brady Statistic AS VS Percent: 1.58 %
Brady Statistic RA Percent Paced: 39.93 %
Brady Statistic RV Percent Paced: 98.35 %
Date Time Interrogation Session: 20250610003753
Implantable Lead Connection Status: 753985
Implantable Lead Connection Status: 753985
Implantable Lead Connection Status: 753985
Implantable Lead Implant Date: 20171214
Implantable Lead Implant Date: 20171214
Implantable Lead Implant Date: 20171214
Implantable Lead Location: 753858
Implantable Lead Location: 753859
Implantable Lead Location: 753860
Implantable Lead Model: 4598
Implantable Lead Model: 5076
Implantable Lead Model: 5076
Implantable Pulse Generator Implant Date: 20171214
Lead Channel Impedance Value: 285 Ohm
Lead Channel Impedance Value: 285 Ohm
Lead Channel Impedance Value: 304 Ohm
Lead Channel Impedance Value: 323 Ohm
Lead Channel Impedance Value: 323 Ohm
Lead Channel Impedance Value: 323 Ohm
Lead Channel Impedance Value: 342 Ohm
Lead Channel Impedance Value: 342 Ohm
Lead Channel Impedance Value: 380 Ohm
Lead Channel Impedance Value: 494 Ohm
Lead Channel Impedance Value: 494 Ohm
Lead Channel Impedance Value: 532 Ohm
Lead Channel Impedance Value: 551 Ohm
Lead Channel Impedance Value: 551 Ohm
Lead Channel Pacing Threshold Amplitude: 0.5 V
Lead Channel Pacing Threshold Amplitude: 0.625 V
Lead Channel Pacing Threshold Amplitude: 1.5 V
Lead Channel Pacing Threshold Pulse Width: 0.4 ms
Lead Channel Pacing Threshold Pulse Width: 0.4 ms
Lead Channel Pacing Threshold Pulse Width: 0.4 ms
Lead Channel Sensing Intrinsic Amplitude: 1 mV
Lead Channel Sensing Intrinsic Amplitude: 1 mV
Lead Channel Sensing Intrinsic Amplitude: 19.75 mV
Lead Channel Sensing Intrinsic Amplitude: 19.75 mV
Lead Channel Setting Pacing Amplitude: 2 V
Lead Channel Setting Pacing Amplitude: 2 V
Lead Channel Setting Pacing Amplitude: 2.5 V
Lead Channel Setting Pacing Pulse Width: 0.4 ms
Lead Channel Setting Pacing Pulse Width: 0.4 ms
Lead Channel Setting Sensing Sensitivity: 2.8 mV
Zone Setting Status: 755011
Zone Setting Status: 755011

## 2023-08-12 ENCOUNTER — Ambulatory Visit: Payer: Self-pay | Admitting: Internal Medicine

## 2023-08-24 NOTE — Addendum Note (Signed)
 Addended by: TAWNI DRILLING D on: 08/24/2023 09:21 AM   Modules accepted: Orders

## 2023-08-24 NOTE — Progress Notes (Signed)
 Remote pacemaker transmission.

## 2023-09-08 ENCOUNTER — Ambulatory Visit: Payer: Self-pay | Admitting: Internal Medicine

## 2023-09-08 LAB — CUP PACEART REMOTE DEVICE CHECK
Battery Remaining Longevity: 2 mo
Battery Voltage: 2.61 V
Brady Statistic AP VP Percent: 29.48 %
Brady Statistic AP VS Percent: 0.06 %
Brady Statistic AS VP Percent: 68.48 %
Brady Statistic AS VS Percent: 1.99 %
Brady Statistic RA Percent Paced: 30.74 %
Brady Statistic RV Percent Paced: 97.95 %
Date Time Interrogation Session: 20250710003105
Implantable Lead Connection Status: 753985
Implantable Lead Connection Status: 753985
Implantable Lead Connection Status: 753985
Implantable Lead Implant Date: 20171214
Implantable Lead Implant Date: 20171214
Implantable Lead Implant Date: 20171214
Implantable Lead Location: 753858
Implantable Lead Location: 753859
Implantable Lead Location: 753860
Implantable Lead Model: 4598
Implantable Lead Model: 5076
Implantable Lead Model: 5076
Implantable Pulse Generator Implant Date: 20171214
Lead Channel Impedance Value: 285 Ohm
Lead Channel Impedance Value: 285 Ohm
Lead Channel Impedance Value: 304 Ohm
Lead Channel Impedance Value: 323 Ohm
Lead Channel Impedance Value: 323 Ohm
Lead Channel Impedance Value: 323 Ohm
Lead Channel Impedance Value: 342 Ohm
Lead Channel Impedance Value: 342 Ohm
Lead Channel Impedance Value: 380 Ohm
Lead Channel Impedance Value: 494 Ohm
Lead Channel Impedance Value: 513 Ohm
Lead Channel Impedance Value: 513 Ohm
Lead Channel Impedance Value: 551 Ohm
Lead Channel Impedance Value: 551 Ohm
Lead Channel Pacing Threshold Amplitude: 0.625 V
Lead Channel Pacing Threshold Amplitude: 0.625 V
Lead Channel Pacing Threshold Amplitude: 1.625 V
Lead Channel Pacing Threshold Pulse Width: 0.4 ms
Lead Channel Pacing Threshold Pulse Width: 0.4 ms
Lead Channel Pacing Threshold Pulse Width: 0.4 ms
Lead Channel Sensing Intrinsic Amplitude: 0.875 mV
Lead Channel Sensing Intrinsic Amplitude: 0.875 mV
Lead Channel Sensing Intrinsic Amplitude: 19.75 mV
Lead Channel Sensing Intrinsic Amplitude: 19.75 mV
Lead Channel Setting Pacing Amplitude: 2 V
Lead Channel Setting Pacing Amplitude: 2.25 V
Lead Channel Setting Pacing Amplitude: 2.5 V
Lead Channel Setting Pacing Pulse Width: 0.4 ms
Lead Channel Setting Pacing Pulse Width: 0.4 ms
Lead Channel Setting Sensing Sensitivity: 2.8 mV
Zone Setting Status: 755011
Zone Setting Status: 755011

## 2023-10-10 ENCOUNTER — Ambulatory Visit: Payer: Self-pay | Admitting: Internal Medicine

## 2023-10-10 ENCOUNTER — Ambulatory Visit: Payer: Medicare HMO

## 2023-10-10 DIAGNOSIS — I255 Ischemic cardiomyopathy: Secondary | ICD-10-CM | POA: Diagnosis not present

## 2023-10-10 LAB — CUP PACEART REMOTE DEVICE CHECK
Battery Remaining Longevity: 1 mo
Battery Voltage: 2.61 V
Brady Statistic AP VP Percent: 25.4 %
Brady Statistic AP VS Percent: 0.06 %
Brady Statistic AS VP Percent: 72.38 %
Brady Statistic AS VS Percent: 2.17 %
Brady Statistic RA Percent Paced: 26.74 %
Brady Statistic RV Percent Paced: 97.77 %
Date Time Interrogation Session: 20250811002756
Implantable Lead Connection Status: 753985
Implantable Lead Connection Status: 753985
Implantable Lead Connection Status: 753985
Implantable Lead Implant Date: 20171214
Implantable Lead Implant Date: 20171214
Implantable Lead Implant Date: 20171214
Implantable Lead Location: 753858
Implantable Lead Location: 753859
Implantable Lead Location: 753860
Implantable Lead Model: 4598
Implantable Lead Model: 5076
Implantable Lead Model: 5076
Implantable Pulse Generator Implant Date: 20171214
Lead Channel Impedance Value: 285 Ohm
Lead Channel Impedance Value: 285 Ohm
Lead Channel Impedance Value: 304 Ohm
Lead Channel Impedance Value: 323 Ohm
Lead Channel Impedance Value: 323 Ohm
Lead Channel Impedance Value: 323 Ohm
Lead Channel Impedance Value: 323 Ohm
Lead Channel Impedance Value: 361 Ohm
Lead Channel Impedance Value: 399 Ohm
Lead Channel Impedance Value: 475 Ohm
Lead Channel Impedance Value: 494 Ohm
Lead Channel Impedance Value: 532 Ohm
Lead Channel Impedance Value: 551 Ohm
Lead Channel Impedance Value: 551 Ohm
Lead Channel Pacing Threshold Amplitude: 0.625 V
Lead Channel Pacing Threshold Amplitude: 0.75 V
Lead Channel Pacing Threshold Amplitude: 1.5 V
Lead Channel Pacing Threshold Pulse Width: 0.4 ms
Lead Channel Pacing Threshold Pulse Width: 0.4 ms
Lead Channel Pacing Threshold Pulse Width: 0.4 ms
Lead Channel Sensing Intrinsic Amplitude: 0.75 mV
Lead Channel Sensing Intrinsic Amplitude: 0.75 mV
Lead Channel Sensing Intrinsic Amplitude: 18.5 mV
Lead Channel Sensing Intrinsic Amplitude: 18.5 mV
Lead Channel Setting Pacing Amplitude: 2 V
Lead Channel Setting Pacing Amplitude: 2 V
Lead Channel Setting Pacing Amplitude: 2.5 V
Lead Channel Setting Pacing Pulse Width: 0.4 ms
Lead Channel Setting Pacing Pulse Width: 0.4 ms
Lead Channel Setting Sensing Sensitivity: 2.8 mV
Zone Setting Status: 755011
Zone Setting Status: 755011

## 2023-10-11 NOTE — Progress Notes (Signed)
 Remote pacemaker transmission.

## 2023-11-11 ENCOUNTER — Ambulatory Visit

## 2023-11-14 LAB — CUP PACEART REMOTE DEVICE CHECK
Battery Remaining Longevity: 1 mo — CL
Battery Voltage: 2.61 V
Brady Statistic AP VP Percent: 27.43 %
Brady Statistic AP VS Percent: 0.04 %
Brady Statistic AS VP Percent: 69.93 %
Brady Statistic AS VS Percent: 2.6 %
Brady Statistic RA Percent Paced: 29.23 %
Brady Statistic RV Percent Paced: 97.36 %
Date Time Interrogation Session: 20250913010024
Implantable Lead Connection Status: 753985
Implantable Lead Connection Status: 753985
Implantable Lead Connection Status: 753985
Implantable Lead Implant Date: 20171214
Implantable Lead Implant Date: 20171214
Implantable Lead Implant Date: 20171214
Implantable Lead Location: 753858
Implantable Lead Location: 753859
Implantable Lead Location: 753860
Implantable Lead Model: 4598
Implantable Lead Model: 5076
Implantable Lead Model: 5076
Implantable Pulse Generator Implant Date: 20171214
Lead Channel Impedance Value: 304 Ohm
Lead Channel Impedance Value: 304 Ohm
Lead Channel Impedance Value: 323 Ohm
Lead Channel Impedance Value: 342 Ohm
Lead Channel Impedance Value: 361 Ohm
Lead Channel Impedance Value: 361 Ohm
Lead Channel Impedance Value: 361 Ohm
Lead Channel Impedance Value: 361 Ohm
Lead Channel Impedance Value: 399 Ohm
Lead Channel Impedance Value: 513 Ohm
Lead Channel Impedance Value: 532 Ohm
Lead Channel Impedance Value: 551 Ohm
Lead Channel Impedance Value: 570 Ohm
Lead Channel Impedance Value: 589 Ohm
Lead Channel Pacing Threshold Amplitude: 0.625 V
Lead Channel Pacing Threshold Amplitude: 0.625 V
Lead Channel Pacing Threshold Amplitude: 1.375 V
Lead Channel Pacing Threshold Pulse Width: 0.4 ms
Lead Channel Pacing Threshold Pulse Width: 0.4 ms
Lead Channel Pacing Threshold Pulse Width: 0.4 ms
Lead Channel Sensing Intrinsic Amplitude: 0.75 mV
Lead Channel Sensing Intrinsic Amplitude: 0.75 mV
Lead Channel Sensing Intrinsic Amplitude: 18.875 mV
Lead Channel Sensing Intrinsic Amplitude: 18.875 mV
Lead Channel Setting Pacing Amplitude: 2 V
Lead Channel Setting Pacing Amplitude: 2 V
Lead Channel Setting Pacing Amplitude: 2.5 V
Lead Channel Setting Pacing Pulse Width: 0.4 ms
Lead Channel Setting Pacing Pulse Width: 0.4 ms
Lead Channel Setting Sensing Sensitivity: 2.8 mV
Zone Setting Status: 755011
Zone Setting Status: 755011

## 2023-11-18 ENCOUNTER — Ambulatory Visit: Payer: Self-pay | Admitting: Internal Medicine

## 2023-11-25 NOTE — Progress Notes (Signed)
 Remote PPM Transmission

## 2023-12-11 ENCOUNTER — Encounter

## 2023-12-12 ENCOUNTER — Encounter

## 2023-12-12 LAB — CUP PACEART REMOTE DEVICE CHECK
Battery Remaining Longevity: 1 mo — CL
Battery Voltage: 2.6 V
Brady Statistic AP VP Percent: 17.41 %
Brady Statistic AP VS Percent: 0.04 %
Brady Statistic AS VP Percent: 79.95 %
Brady Statistic AS VS Percent: 2.6 %
Brady Statistic RA Percent Paced: 19.11 %
Brady Statistic RV Percent Paced: 97.36 %
Date Time Interrogation Session: 20251013002804
Implantable Lead Connection Status: 753985
Implantable Lead Connection Status: 753985
Implantable Lead Connection Status: 753985
Implantable Lead Implant Date: 20171214
Implantable Lead Implant Date: 20171214
Implantable Lead Implant Date: 20171214
Implantable Lead Location: 753858
Implantable Lead Location: 753859
Implantable Lead Location: 753860
Implantable Lead Model: 4598
Implantable Lead Model: 5076
Implantable Lead Model: 5076
Implantable Pulse Generator Implant Date: 20171214
Lead Channel Impedance Value: 285 Ohm
Lead Channel Impedance Value: 285 Ohm
Lead Channel Impedance Value: 304 Ohm
Lead Channel Impedance Value: 304 Ohm
Lead Channel Impedance Value: 323 Ohm
Lead Channel Impedance Value: 323 Ohm
Lead Channel Impedance Value: 323 Ohm
Lead Channel Impedance Value: 361 Ohm
Lead Channel Impedance Value: 380 Ohm
Lead Channel Impedance Value: 475 Ohm
Lead Channel Impedance Value: 494 Ohm
Lead Channel Impedance Value: 532 Ohm
Lead Channel Impedance Value: 551 Ohm
Lead Channel Impedance Value: 551 Ohm
Lead Channel Pacing Threshold Amplitude: 0.625 V
Lead Channel Pacing Threshold Amplitude: 0.75 V
Lead Channel Pacing Threshold Amplitude: 1.375 V
Lead Channel Pacing Threshold Pulse Width: 0.4 ms
Lead Channel Pacing Threshold Pulse Width: 0.4 ms
Lead Channel Pacing Threshold Pulse Width: 0.4 ms
Lead Channel Sensing Intrinsic Amplitude: 1 mV
Lead Channel Sensing Intrinsic Amplitude: 1 mV
Lead Channel Sensing Intrinsic Amplitude: 20.75 mV
Lead Channel Sensing Intrinsic Amplitude: 20.75 mV
Lead Channel Setting Pacing Amplitude: 2 V
Lead Channel Setting Pacing Amplitude: 2 V
Lead Channel Setting Pacing Amplitude: 2.5 V
Lead Channel Setting Pacing Pulse Width: 0.4 ms
Lead Channel Setting Pacing Pulse Width: 0.4 ms
Lead Channel Setting Sensing Sensitivity: 2.8 mV
Zone Setting Status: 755011
Zone Setting Status: 755011

## 2023-12-15 ENCOUNTER — Ambulatory Visit: Payer: Self-pay | Admitting: Internal Medicine

## 2023-12-15 ENCOUNTER — Telehealth: Payer: Self-pay

## 2023-12-15 NOTE — Telephone Encounter (Signed)
 Alert remote transmission: RRT reached 12/15/23   ____________________________________________________________________  [name] [name] w/ patient regarding device reaching ERI on 12/15/2023. Patient informed there is still 3 months of life left on the battery at this time. Patient aware F/U appt is needed w/ provider to discuss Gen Change procedure and is agreeable.   Will forward to our EP scheduling team to set up F/U appt.   Informed patient to contact device clinic for any questions or concerns. Will continue to monitor and update accordingly.

## 2023-12-15 NOTE — Telephone Encounter (Signed)
 Spoke w/ patient - he is scheduled to see Dr. Waddell 10/30.

## 2023-12-29 ENCOUNTER — Ambulatory Visit: Admitting: Internal Medicine

## 2024-01-11 ENCOUNTER — Ambulatory Visit: Attending: Internal Medicine | Admitting: Internal Medicine

## 2024-01-11 ENCOUNTER — Encounter: Payer: Self-pay | Admitting: Internal Medicine

## 2024-01-11 VITALS — BP 142/80 | HR 70 | Ht 72.0 in | Wt 234.0 lb

## 2024-01-11 DIAGNOSIS — R001 Bradycardia, unspecified: Secondary | ICD-10-CM

## 2024-01-11 DIAGNOSIS — Z01812 Encounter for preprocedural laboratory examination: Secondary | ICD-10-CM

## 2024-01-11 DIAGNOSIS — I255 Ischemic cardiomyopathy: Secondary | ICD-10-CM

## 2024-01-11 DIAGNOSIS — I10 Essential (primary) hypertension: Secondary | ICD-10-CM

## 2024-01-11 LAB — CUP PACEART INCLINIC DEVICE CHECK
Date Time Interrogation Session: 20251112163237
Implantable Lead Connection Status: 753985
Implantable Lead Connection Status: 753985
Implantable Lead Connection Status: 753985
Implantable Lead Implant Date: 20171214
Implantable Lead Implant Date: 20171214
Implantable Lead Implant Date: 20171214
Implantable Lead Location: 753858
Implantable Lead Location: 753859
Implantable Lead Location: 753860
Implantable Lead Model: 4598
Implantable Lead Model: 5076
Implantable Lead Model: 5076
Implantable Pulse Generator Implant Date: 20171214

## 2024-01-11 NOTE — Patient Instructions (Addendum)
 Medication Instructions:  Your physician recommends that you continue on your current medications as directed. Please refer to the Current Medication list given to you today.  *If you need a refill on your cardiac medications before your next appointment, please call your pharmacy*  Lab Work: CBC Bmet -- Today  You may go to any [facility] Location for your lab work:  [facility] - [address] ([facility]) - [address] [phone] [address]  [place] - [address]   [place]  - [address]   [place] - [address] - [address] [place]  - [address] ([facility]   If you have labs (blood work) drawn today and your tests are completely normal, you will receive your results only by: [employer] (if you have MyChart)  If you have any lab test that is abnormal or we need to change your treatment, we will call you or send a MyChart message to review the results.  Testing/Procedures: None ordered.  Follow-Up: At [facility], you and your health needs are our priority.  As part of our continuing mission to provide you with exceptional heart care, we have created designated Provider Care Teams.  These Care Teams include your primary Cardiologist (physician) and Advanced Practice Providers (APPs -  Physician Assistants and Nurse Practitioners) who all work together to provide you with the care you need, when you need it.

## 2024-01-11 NOTE — H&P (View-Only) (Signed)
 HPI [name] returns today for followup. He is a pleasant 73 yo man with a h/o chronic systolic heart failure, s/p biv PPM insertion, CHB, who has had normalization of his LV function after biv pacing. He denies chest pain or sob. He is still working. No syncope. He has chronic knee pain. He has not had surgery. He is still working 2 jobs. He denies peripheral edema. He notes that his bp is well controlled at home and when he is not in the doctors office.  Allergies  Allergen Reactions   Other Other (See Comments)    Renal insufficiency  Renal insufficiency   Metformin And Related Other (See Comments)    Renal insufficiency     Current Outpatient Medications  Medication Sig Dispense Refill   amLODipine  (NORVASC ) 10 MG tablet Take 10 mg by mouth daily.      atorvastatin  (LIPITOR ) 80 MG tablet Take 1 tablet (80 mg total) by mouth daily at 6 PM. 30 tablet 6   azelastine (ASTELIN) 0.1 % nasal spray Place 1 spray into both nostrils 2 (two) times daily.     BAYER MICROLET LANCETS lancets Use as instructed to check blood sugar once daily dx code 250.42 100 each 1   desloratadine (CLARINEX) 5 MG tablet Take 5 mg by mouth daily.     ferrous sulfate 325 (65 FE) MG tablet Take 1 tablet by mouth daily.     furosemide  (LASIX ) 40 MG tablet Take 1 tablet (40 mg total) by mouth daily. 90 tablet 3   glucose blood (ONETOUCH VERIO) test strip Use as instructed to check blood sugar once a day dx code E11.29 100 each 1   insulin  degludec (TRESIBA) 100 UNIT/ML SOPN FlexTouch Pen Inject 20 Units into the skin daily.     isosorbide  mononitrate (IMDUR ) 60 MG 24 hr tablet Take 1 tablet (60 mg total) by mouth daily. 30 tablet 6   losartan  (COZAAR ) 100 MG tablet Take 100 mg by mouth daily.     Multiple Vitamin (MULTIVITAMIN WITH MINERALS) TABS Take 1 tablet by mouth daily.     Rivaroxaban  (XARELTO ) 15 MG TABS tablet TAKE 1 TABLET BY MOUTH ONCE DAILY WITH SUPPER 90 tablet 3   tamsulosin (FLOMAX) 0.4 MG CAPS  capsule Take 0.4 mg by mouth daily.     TRULICITY 1.5 MG/0.5ML SOAJ Inject 1.5 mg into the skin once a week.     No current facility-administered medications for this visit.     Past Medical History:  Diagnosis Date   Arthritis    CKD (chronic kidney disease) stage 3, GFR 30-59 ml/min (HCC)    CKD   Coronary artery disease    s/p NSTEMI with multivessel CAD wit hoccluded RCA; myoview with inferior and inferoseptal infarct with minimal peri infarct ishemia - managed medically   Diabetes mellitus    Diabetes mellitus type 2 in obese    Dysrhythmia 03/2012   bradycardia   Heart murmur    History of echocardiogram    Echo 8/16:  Mild LVH, EF 50-55%, trivial AI, mild MR, severe LAE, mild RAE, PASP 37 mmHg  //  Echo 03/02/16: Mild concentric LVH, EF 50-55, normal wall motion, grade 2 diastolic dysfunction, mild MR, moderate to severe LAE, PASP 35   Hypertension    LV dysfunction Jan 2014   EF is 45 to 50% per echo   Myocardial infarction (HCC) 03/2012   Neuromuscular disorder (HCC)    TREMORS   Obese  PAF (paroxysmal atrial fibrillation) (HCC) Jan 2014   Brief episode - no anticoagulation    ROS:   All systems reviewed and negative except as noted in the HPI.   Past Surgical History:  Procedure Laterality Date   CARDIAC CATHETERIZATION  03/21/2012   EP IMPLANTABLE DEVICE N/A 02/12/2016   Procedure: BiV Pacemaker Insertion CRT-P;  Surgeon: Danelle LELON Birmingham, MD;  Location: [facility] INVASIVE CV LAB;  Service: Cardiovascular;  Laterality: N/A;   LEFT HEART CATHETERIZATION WITH CORONARY ANGIOGRAM N/A 03/21/2012   Procedure: LEFT HEART CATHETERIZATION WITH CORONARY ANGIOGRAM;  Surgeon: Peter M Jordan, MD;  Location: [facility] CATH LAB;  Service: Cardiovascular;  Laterality: N/A;   RIB FRACTURE SURGERY     s/p knee surgery for torn ligament       Family History  Problem Relation Age of Onset   Heart disease Father      Social History   Socioeconomic History   Marital status: Married     Spouse name: Not on file   Number of children: Not on file   Years of education: Not on file   Highest education level: Not on file  Occupational History   Not on file  Tobacco Use   Smoking status: Former    Current packs/day: 0.00    Average packs/day: 0.3 packs/day for 6.0 years (1.5 ttl pk-yrs)    Types: Cigarettes    Start date: 06/30/1966    Quit date: 06/29/1972    Years since quitting: 51.5   Smokeless tobacco: Never  Vaping Use   Vaping status: Never Used  Substance and Sexual Activity   Alcohol use: No   Drug use: No   Sexual activity: Never  Other Topics Concern   Not on file  Social History Narrative   Not on file   Social Drivers of Health   Financial Resource Strain: Low Risk  (06/21/2023)   Received from [facility]   Overall Financial Resource Strain (CARDIA)    Difficulty of Paying Living Expenses: Not hard at all  Food Insecurity: No Food Insecurity (06/21/2023)   Received from [facility]   Hunger Vital Sign    Within the past 12 months, you worried that your food would run out before you got the money to buy more.: Never true    Within the past 12 months, the food you bought just didn't last and you didn't have money to get more.: Never true  Transportation Needs: No Transportation Needs (06/21/2023)   Received from [facility] - Transportation    Lack of Transportation (Medical): No    Lack of Transportation (Non-Medical): No  Physical Activity: Unknown (05/07/2023)   Received from [facility]   Exercise Vital Sign    On average, how many days per week do you engage in moderate to strenuous exercise (like a brisk walk)?: 5 days    Minutes of Exercise per Session: Not on file  Recent Concern: Physical Activity - Insufficiently Active (05/07/2023)   Received from [facility]   Exercise Vital Sign    Days of Exercise per Week: 5 days    Minutes of Exercise per Session: 20 min  Stress: No Stress Concern Present (06/21/2023)   Received from  [facility] - Occupational Stress Questionnaire    Feeling of Stress : Only a little  Social Connections: Socially Integrated (06/21/2023)   Received from [facility]   Social Network    How would you rate  your social network (family, work, friends)?: Good participation with social networks  Intimate Partner Violence: Not At Risk (06/22/2023)   Received from [facility]   HITS    Over the last 12 months how often did your partner physically hurt you?: Never    Over the last 12 months how often did your partner insult you or talk down to you?: Never    Over the last 12 months how often did your partner threaten you with physical harm?: Never    Over the last 12 months how often did your partner scream or curse at you?: Never     BP (!) 142/80 (BP Location: Left Arm, Patient Position: Sitting, Cuff Size: Large)   Pulse 70   Ht 6' (1.829 m)   Wt 234 lb (106.1 kg)   SpO2 98%   BMI 31.74 kg/m   Physical Exam:  Well appearing middle aged man, NAD HEENT: Unremarkable Neck:  No JVD, no thyromegally Lymphatics:  No adenopathy Back:  No CVA tenderness Lungs:  Clear with no wheezes HEART:  Regular rate rhythm, no murmurs, no rubs, no clicks Abd:  soft, positive bowel sounds, no organomegally, no rebound, no guarding Ext:  2 plus pulses, no edema, no cyanosis, no clubbing Skin:  No rashes no nodules Neuro:  CN II through XII intact, motor grossly intact  DEVICE  Normal device function.  See PaceArt for details. ERI.   Assess/Plan: CHB - he is asymptomatic s/p Biv PPM insertion. Chronic systolic heart failure - he had normalization of his EF with biv pacing. We will follow. He has class 2A symptoms.  PPM -his medtronic Biv PPM is working normally. He has reached ERI and will undergo PPM gen change out in the coming weeks.  Danelle Herminia Warren,MD

## 2024-01-11 NOTE — Progress Notes (Signed)
 HPI [name] returns today for followup. He is a pleasant 73 yo man with a h/o chronic systolic heart failure, s/p biv PPM insertion, CHB, who has had normalization of his LV function after biv pacing. He denies chest pain or sob. He is still working. No syncope. He has chronic knee pain. He has not had surgery. He is still working 2 jobs. He denies peripheral edema. He notes that his bp is well controlled at home and when he is not in the doctors office.  Allergies  Allergen Reactions   Other Other (See Comments)    Renal insufficiency  Renal insufficiency   Metformin And Related Other (See Comments)    Renal insufficiency     Current Outpatient Medications  Medication Sig Dispense Refill   amLODipine  (NORVASC ) 10 MG tablet Take 10 mg by mouth daily.      atorvastatin  (LIPITOR ) 80 MG tablet Take 1 tablet (80 mg total) by mouth daily at 6 PM. 30 tablet 6   azelastine (ASTELIN) 0.1 % nasal spray Place 1 spray into both nostrils 2 (two) times daily.     BAYER MICROLET LANCETS lancets Use as instructed to check blood sugar once daily dx code 250.42 100 each 1   desloratadine (CLARINEX) 5 MG tablet Take 5 mg by mouth daily.     ferrous sulfate 325 (65 FE) MG tablet Take 1 tablet by mouth daily.     furosemide  (LASIX ) 40 MG tablet Take 1 tablet (40 mg total) by mouth daily. 90 tablet 3   glucose blood (ONETOUCH VERIO) test strip Use as instructed to check blood sugar once a day dx code E11.29 100 each 1   insulin  degludec (TRESIBA) 100 UNIT/ML SOPN FlexTouch Pen Inject 20 Units into the skin daily.     isosorbide  mononitrate (IMDUR ) 60 MG 24 hr tablet Take 1 tablet (60 mg total) by mouth daily. 30 tablet 6   losartan  (COZAAR ) 100 MG tablet Take 100 mg by mouth daily.     Multiple Vitamin (MULTIVITAMIN WITH MINERALS) TABS Take 1 tablet by mouth daily.     Rivaroxaban  (XARELTO ) 15 MG TABS tablet TAKE 1 TABLET BY MOUTH ONCE DAILY WITH SUPPER 90 tablet 3   tamsulosin (FLOMAX) 0.4 MG CAPS  capsule Take 0.4 mg by mouth daily.     TRULICITY 1.5 MG/0.5ML SOAJ Inject 1.5 mg into the skin once a week.     No current facility-administered medications for this visit.     Past Medical History:  Diagnosis Date   Arthritis    CKD (chronic kidney disease) stage 3, GFR 30-59 ml/min (HCC)    CKD   Coronary artery disease    s/p NSTEMI with multivessel CAD wit hoccluded RCA; myoview with inferior and inferoseptal infarct with minimal peri infarct ishemia - managed medically   Diabetes mellitus    Diabetes mellitus type 2 in obese    Dysrhythmia 03/2012   bradycardia   Heart murmur    History of echocardiogram    Echo 8/16:  Mild LVH, EF 50-55%, trivial AI, mild MR, severe LAE, mild RAE, PASP 37 mmHg  //  Echo 03/02/16: Mild concentric LVH, EF 50-55, normal wall motion, grade 2 diastolic dysfunction, mild MR, moderate to severe LAE, PASP 35   Hypertension    LV dysfunction Jan 2014   EF is 45 to 50% per echo   Myocardial infarction (HCC) 03/2012   Neuromuscular disorder (HCC)    TREMORS   Obese  PAF (paroxysmal atrial fibrillation) (HCC) Jan 2014   Brief episode - no anticoagulation    ROS:   All systems reviewed and negative except as noted in the HPI.   Past Surgical History:  Procedure Laterality Date   CARDIAC CATHETERIZATION  03/21/2012   EP IMPLANTABLE DEVICE N/A 02/12/2016   Procedure: BiV Pacemaker Insertion CRT-P;  Surgeon: Danelle LELON Birmingham, MD;  Location: [facility] INVASIVE CV LAB;  Service: Cardiovascular;  Laterality: N/A;   LEFT HEART CATHETERIZATION WITH CORONARY ANGIOGRAM N/A 03/21/2012   Procedure: LEFT HEART CATHETERIZATION WITH CORONARY ANGIOGRAM;  Surgeon: Peter M Jordan, MD;  Location: [facility] CATH LAB;  Service: Cardiovascular;  Laterality: N/A;   RIB FRACTURE SURGERY     s/p knee surgery for torn ligament       Family History  Problem Relation Age of Onset   Heart disease Father      Social History   Socioeconomic History   Marital status: Married     Spouse name: Not on file   Number of children: Not on file   Years of education: Not on file   Highest education level: Not on file  Occupational History   Not on file  Tobacco Use   Smoking status: Former    Current packs/day: 0.00    Average packs/day: 0.3 packs/day for 6.0 years (1.5 ttl pk-yrs)    Types: Cigarettes    Start date: 06/30/1966    Quit date: 06/29/1972    Years since quitting: 51.5   Smokeless tobacco: Never  Vaping Use   Vaping status: Never Used  Substance and Sexual Activity   Alcohol use: No   Drug use: No   Sexual activity: Never  Other Topics Concern   Not on file  Social History Narrative   Not on file   Social Drivers of Health   Financial Resource Strain: Low Risk  (06/21/2023)   Received from [facility]   Overall Financial Resource Strain (CARDIA)    Difficulty of Paying Living Expenses: Not hard at all  Food Insecurity: No Food Insecurity (06/21/2023)   Received from [facility]   Hunger Vital Sign    Within the past 12 months, you worried that your food would run out before you got the money to buy more.: Never true    Within the past 12 months, the food you bought just didn't last and you didn't have money to get more.: Never true  Transportation Needs: No Transportation Needs (06/21/2023)   Received from [facility] - Transportation    Lack of Transportation (Medical): No    Lack of Transportation (Non-Medical): No  Physical Activity: Unknown (05/07/2023)   Received from [facility]   Exercise Vital Sign    On average, how many days per week do you engage in moderate to strenuous exercise (like a brisk walk)?: 5 days    Minutes of Exercise per Session: Not on file  Recent Concern: Physical Activity - Insufficiently Active (05/07/2023)   Received from [facility]   Exercise Vital Sign    Days of Exercise per Week: 5 days    Minutes of Exercise per Session: 20 min  Stress: No Stress Concern Present (06/21/2023)   Received from  [facility] - Occupational Stress Questionnaire    Feeling of Stress : Only a little  Social Connections: Socially Integrated (06/21/2023)   Received from [facility]   Social Network    How would you rate  your social network (family, work, friends)?: Good participation with social networks  Intimate Partner Violence: Not At Risk (06/22/2023)   Received from [facility]   HITS    Over the last 12 months how often did your partner physically hurt you?: Never    Over the last 12 months how often did your partner insult you or talk down to you?: Never    Over the last 12 months how often did your partner threaten you with physical harm?: Never    Over the last 12 months how often did your partner scream or curse at you?: Never     BP (!) 142/80 (BP Location: Left Arm, Patient Position: Sitting, Cuff Size: Large)   Pulse 70   Ht 6' (1.829 m)   Wt 234 lb (106.1 kg)   SpO2 98%   BMI 31.74 kg/m   Physical Exam:  Well appearing middle aged man, NAD HEENT: Unremarkable Neck:  No JVD, no thyromegally Lymphatics:  No adenopathy Back:  No CVA tenderness Lungs:  Clear with no wheezes HEART:  Regular rate rhythm, no murmurs, no rubs, no clicks Abd:  soft, positive bowel sounds, no organomegally, no rebound, no guarding Ext:  2 plus pulses, no edema, no cyanosis, no clubbing Skin:  No rashes no nodules Neuro:  CN II through XII intact, motor grossly intact  DEVICE  Normal device function.  See PaceArt for details. ERI.   Assess/Plan: CHB - he is asymptomatic s/p Biv PPM insertion. Chronic systolic heart failure - he had normalization of his EF with biv pacing. We will follow. He has class 2A symptoms.  PPM -his medtronic Biv PPM is working normally. He has reached ERI and will undergo PPM gen change out in the coming weeks.  Danelle Herminia Warren,MD

## 2024-01-12 ENCOUNTER — Ambulatory Visit (INDEPENDENT_AMBULATORY_CARE_PROVIDER_SITE_OTHER)

## 2024-01-12 ENCOUNTER — Encounter

## 2024-01-12 DIAGNOSIS — I255 Ischemic cardiomyopathy: Secondary | ICD-10-CM

## 2024-01-12 LAB — BASIC METABOLIC PANEL WITH GFR
BUN/Creatinine Ratio: 14 (ref 10–24)
BUN: 32 mg/dL — ABNORMAL HIGH (ref 8–27)
CO2: 25 mmol/L (ref 20–29)
Calcium: 9.4 mg/dL (ref 8.6–10.2)
Chloride: 99 mmol/L (ref 96–106)
Creatinine, Ser: 2.27 mg/dL — ABNORMAL HIGH (ref 0.76–1.27)
Glucose: 94 mg/dL (ref 70–99)
Potassium: 5 mmol/L (ref 3.5–5.2)
Sodium: 135 mmol/L (ref 134–144)
eGFR: 30 mL/min/1.73 — ABNORMAL LOW (ref >59)

## 2024-01-12 LAB — CBC
Hematocrit: 33.6 % — ABNORMAL LOW (ref 37.5–51.0)
Hemoglobin: 10.6 g/dL — ABNORMAL LOW (ref 13.0–17.7)
MCH: 28.4 pg (ref 26.6–33.0)
MCHC: 31.5 g/dL (ref 31.5–35.7)
MCV: 90 fL (ref 79–97)
Platelets: 288 x10E3/uL (ref 150–450)
RBC: 3.73 x10E6/uL — ABNORMAL LOW (ref 4.14–5.80)
RDW: 14.4 % (ref 11.6–15.4)
WBC: 6.1 x10E3/uL (ref 3.4–10.8)

## 2024-01-16 LAB — CUP PACEART REMOTE DEVICE CHECK
Battery Remaining Longevity: 1 mo — CL
Battery Voltage: 2.6 V
Brady Statistic AP VP Percent: 3.45 %
Brady Statistic AP VS Percent: 0.01 %
Brady Statistic AS VP Percent: 94.59 %
Brady Statistic AS VS Percent: 1.95 %
Brady Statistic RA Percent Paced: 4.3 %
Brady Statistic RV Percent Paced: 98.04 %
Date Time Interrogation Session: 20251114094213
Implantable Lead Connection Status: 753985
Implantable Lead Connection Status: 753985
Implantable Lead Connection Status: 753985
Implantable Lead Implant Date: 20171214
Implantable Lead Implant Date: 20171214
Implantable Lead Implant Date: 20171214
Implantable Lead Location: 753858
Implantable Lead Location: 753859
Implantable Lead Location: 753860
Implantable Lead Model: 4598
Implantable Lead Model: 5076
Implantable Lead Model: 5076
Implantable Pulse Generator Implant Date: 20171214
Lead Channel Impedance Value: 285 Ohm
Lead Channel Impedance Value: 304 Ohm
Lead Channel Impedance Value: 304 Ohm
Lead Channel Impedance Value: 323 Ohm
Lead Channel Impedance Value: 323 Ohm
Lead Channel Impedance Value: 323 Ohm
Lead Channel Impedance Value: 342 Ohm
Lead Channel Impedance Value: 361 Ohm
Lead Channel Impedance Value: 399 Ohm
Lead Channel Impedance Value: 513 Ohm
Lead Channel Impedance Value: 513 Ohm
Lead Channel Impedance Value: 513 Ohm
Lead Channel Impedance Value: 513 Ohm
Lead Channel Impedance Value: 532 Ohm
Lead Channel Pacing Threshold Amplitude: 0.625 V
Lead Channel Pacing Threshold Amplitude: 0.625 V
Lead Channel Pacing Threshold Amplitude: 1.375 V
Lead Channel Pacing Threshold Pulse Width: 0.4 ms
Lead Channel Pacing Threshold Pulse Width: 0.4 ms
Lead Channel Pacing Threshold Pulse Width: 0.4 ms
Lead Channel Sensing Intrinsic Amplitude: 0.875 mV
Lead Channel Sensing Intrinsic Amplitude: 0.875 mV
Lead Channel Sensing Intrinsic Amplitude: 20.625 mV
Lead Channel Sensing Intrinsic Amplitude: 20.625 mV
Lead Channel Setting Pacing Amplitude: 2 V
Lead Channel Setting Pacing Amplitude: 2 V
Lead Channel Setting Pacing Amplitude: 2.5 V
Lead Channel Setting Pacing Pulse Width: 0.4 ms
Lead Channel Setting Pacing Pulse Width: 0.4 ms
Lead Channel Setting Sensing Sensitivity: 2.8 mV
Zone Setting Status: 755011
Zone Setting Status: 755011

## 2024-01-17 NOTE — Progress Notes (Signed)
 Remote PPM Transmission

## 2024-01-18 ENCOUNTER — Ambulatory Visit: Payer: Self-pay | Admitting: Internal Medicine

## 2024-01-18 LAB — COLOGUARD: COLOGUARD: NEGATIVE

## 2024-01-19 ENCOUNTER — Telehealth (HOSPITAL_COMMUNITY): Payer: Self-pay

## 2024-01-19 NOTE — Telephone Encounter (Signed)
 Attempted to reach patient to discuss upcoming procedure, no answer. Unable to leave VM- mailbox full.

## 2024-02-08 NOTE — Pre-Procedure Instructions (Signed)
 Instructed patient on the following items: Arrival time 0515 Nothing to eat or drink after midnight No meds AM of procedure Responsible person to drive you home and stay with you for 24 hrs Wash with special soap night before and morning of procedure If on anti-coagulant drug instructions Xarelto - last dose 12/8

## 2024-02-09 ENCOUNTER — Encounter (HOSPITAL_COMMUNITY): Admission: RE | Disposition: A | Payer: Self-pay | Source: Home / Self Care | Attending: Internal Medicine

## 2024-02-09 ENCOUNTER — Other Ambulatory Visit: Payer: Self-pay

## 2024-02-09 ENCOUNTER — Ambulatory Visit (HOSPITAL_COMMUNITY)
Admission: RE | Admit: 2024-02-09 | Discharge: 2024-02-09 | Disposition: A | Attending: Internal Medicine | Admitting: Internal Medicine

## 2024-02-09 DIAGNOSIS — Z7901 Long term (current) use of anticoagulants: Secondary | ICD-10-CM | POA: Diagnosis not present

## 2024-02-09 DIAGNOSIS — I5022 Chronic systolic (congestive) heart failure: Secondary | ICD-10-CM

## 2024-02-09 DIAGNOSIS — I442 Atrioventricular block, complete: Secondary | ICD-10-CM | POA: Diagnosis not present

## 2024-02-09 DIAGNOSIS — E1122 Type 2 diabetes mellitus with diabetic chronic kidney disease: Secondary | ICD-10-CM | POA: Diagnosis not present

## 2024-02-09 DIAGNOSIS — Z45010 Encounter for checking and testing of cardiac pacemaker pulse generator [battery]: Secondary | ICD-10-CM | POA: Diagnosis present

## 2024-02-09 DIAGNOSIS — Z7985 Long-term (current) use of injectable non-insulin antidiabetic drugs: Secondary | ICD-10-CM | POA: Diagnosis not present

## 2024-02-09 DIAGNOSIS — Z87891 Personal history of nicotine dependence: Secondary | ICD-10-CM | POA: Diagnosis not present

## 2024-02-09 DIAGNOSIS — G8929 Other chronic pain: Secondary | ICD-10-CM | POA: Diagnosis not present

## 2024-02-09 DIAGNOSIS — Z79899 Other long term (current) drug therapy: Secondary | ICD-10-CM | POA: Diagnosis not present

## 2024-02-09 DIAGNOSIS — I130 Hypertensive heart and chronic kidney disease with heart failure and stage 1 through stage 4 chronic kidney disease, or unspecified chronic kidney disease: Secondary | ICD-10-CM | POA: Diagnosis not present

## 2024-02-09 DIAGNOSIS — N189 Chronic kidney disease, unspecified: Secondary | ICD-10-CM | POA: Diagnosis not present

## 2024-02-09 DIAGNOSIS — Z794 Long term (current) use of insulin: Secondary | ICD-10-CM | POA: Diagnosis not present

## 2024-02-09 DIAGNOSIS — M25569 Pain in unspecified knee: Secondary | ICD-10-CM | POA: Diagnosis not present

## 2024-02-09 HISTORY — PX: PPM GENERATOR CHANGEOUT: EP1233

## 2024-02-09 LAB — GLUCOSE, CAPILLARY: Glucose-Capillary: 146 mg/dL — ABNORMAL HIGH (ref 70–99)

## 2024-02-09 SURGERY — PPM GENERATOR CHANGEOUT

## 2024-02-09 MED ORDER — MIDAZOLAM HCL (PF) 2 MG/2ML IJ SOLN
INTRAMUSCULAR | Status: DC | PRN
  Administered 2024-02-09: 1 mg via INTRAVENOUS

## 2024-02-09 MED ORDER — SODIUM CHLORIDE 0.9 % IV SOLN
INTRAVENOUS | Status: AC
  Administered 2024-02-09: 80 mg
  Filled 2024-02-09: qty 2

## 2024-02-09 MED ORDER — FENTANYL CITRATE (PF) 100 MCG/2ML IJ SOLN
INTRAMUSCULAR | Status: DC | PRN
  Administered 2024-02-09: 12.5 ug via INTRAVENOUS

## 2024-02-09 MED ORDER — ONDANSETRON HCL 4 MG/2ML IJ SOLN: 4.0000 mg | Freq: Four times a day (QID) | INTRAMUSCULAR | Status: DC | PRN

## 2024-02-09 MED ORDER — CHLORHEXIDINE GLUCONATE 4 % EX SOLN: 4.0000 | Freq: Once | CUTANEOUS | Status: DC

## 2024-02-09 MED ORDER — CEFAZOLIN SODIUM-DEXTROSE 2-4 GM/100ML-% IV SOLN: 2.0000 g | INTRAVENOUS | Status: AC

## 2024-02-09 MED ORDER — FENTANYL CITRATE (PF) 100 MCG/2ML IJ SOLN
INTRAMUSCULAR | Status: AC
  Filled 2024-02-09: qty 2

## 2024-02-09 MED ORDER — MIDAZOLAM HCL 2 MG/2ML IJ SOLN
INTRAMUSCULAR | Status: AC
  Filled 2024-02-09: qty 2

## 2024-02-09 MED ORDER — LIDOCAINE HCL (PF) 1 % IJ SOLN
INTRAMUSCULAR | Status: AC
  Filled 2024-02-09: qty 60

## 2024-02-09 MED ORDER — SODIUM CHLORIDE 0.9 % IV SOLN: INTRAVENOUS | Status: DC

## 2024-02-09 MED ORDER — LIDOCAINE HCL (PF) 1 % IJ SOLN
INTRAMUSCULAR | Status: DC | PRN
  Administered 2024-02-09: 30 mL

## 2024-02-09 MED ORDER — ACETAMINOPHEN 325 MG PO TABS: 325.0000 mg | ORAL_TABLET | ORAL | Status: DC | PRN

## 2024-02-09 MED ORDER — CEFAZOLIN SODIUM-DEXTROSE 2-4 GM/100ML-% IV SOLN
INTRAVENOUS | Status: AC
  Administered 2024-02-09: 2 g via INTRAVENOUS
  Filled 2024-02-09: qty 100

## 2024-02-09 MED ORDER — SODIUM CHLORIDE 0.9 % IV SOLN: 80.0000 mg | INTRAVENOUS | Status: AC

## 2024-02-09 MED ORDER — POVIDONE-IODINE 10 % EX SWAB
2.0000 | Freq: Once | CUTANEOUS | Status: AC
  Administered 2024-02-09: 2 via TOPICAL

## 2024-02-09 SURGICAL SUPPLY — 5 items
CABLE SURGICAL S-101-97-12 (CABLE) ×1 IMPLANT
DEVICE CRTP PERCEPTA QUAD MRI (Pacemaker) IMPLANT
ELECT DEFIB PAD ADLT CADENCE (PAD) IMPLANT
POUCH AIGIS-R ANTIBACT PPM MED (Mesh General) IMPLANT
TRAY PACEMAKER INSERTION (PACKS) ×1 IMPLANT

## 2024-02-09 NOTE — Discharge Instructions (Signed)

## 2024-02-09 NOTE — Progress Notes (Signed)
 Discharge instructions reviewed with patient and wife Reena at bedside, denies questions or concerns , no s/s of complications at the incision site. PT voided prior to discharge. PT was seen by Dr. Waddell and Device rep prior to discharge. PT escorted from the unit via wheel chair to personal vehicle.

## 2024-02-09 NOTE — Interval H&P Note (Signed)
 History and Physical Interval Note:  02/09/2024 7:36 AM  [name] [name]  has presented today for surgery, with the diagnosis of eri.  The various methods of treatment have been discussed with the patient and family. After consideration of risks, benefits and other options for treatment, the patient has consented to  Procedures: PPM GENERATOR CHANGEOUT (N/A) as a surgical intervention.  The patient's history has been reviewed, patient examined, no change in status, stable for surgery.  I have reviewed the patient's chart and labs.  Questions were answered to the patient's satisfaction.     Danelle Birmingham

## 2024-02-10 ENCOUNTER — Encounter (HOSPITAL_COMMUNITY): Payer: Self-pay | Admitting: Internal Medicine

## 2024-02-11 ENCOUNTER — Ambulatory Visit

## 2024-02-12 ENCOUNTER — Ambulatory Visit

## 2024-02-13 ENCOUNTER — Encounter

## 2024-03-13 ENCOUNTER — Ambulatory Visit

## 2024-03-14 ENCOUNTER — Ambulatory Visit

## 2024-03-15 ENCOUNTER — Encounter

## 2024-04-13 ENCOUNTER — Ambulatory Visit

## 2024-04-14 ENCOUNTER — Ambulatory Visit

## 2024-04-16 ENCOUNTER — Encounter

## 2024-05-14 ENCOUNTER — Ambulatory Visit

## 2024-05-15 ENCOUNTER — Ambulatory Visit

## 2024-05-17 ENCOUNTER — Encounter

## 2024-07-13 ENCOUNTER — Ambulatory Visit

## 2024-10-12 ENCOUNTER — Ambulatory Visit

## 2025-01-11 ENCOUNTER — Ambulatory Visit
